# Patient Record
Sex: Male | Born: 1950
Health system: Southern US, Community
[De-identification: ages and names within clinical notes are randomized; demographics above are authoritative.]

## PROBLEM LIST (undated history)

## (undated) DIAGNOSIS — Z8669 Personal history of other diseases of the nervous system and sense organs: Secondary | ICD-10-CM

## (undated) DIAGNOSIS — I10 Essential (primary) hypertension: Secondary | ICD-10-CM

## (undated) DIAGNOSIS — F329 Major depressive disorder, single episode, unspecified: Secondary | ICD-10-CM

## (undated) DIAGNOSIS — K219 Gastro-esophageal reflux disease without esophagitis: Secondary | ICD-10-CM

## (undated) DIAGNOSIS — K439 Ventral hernia without obstruction or gangrene: Secondary | ICD-10-CM

## (undated) DIAGNOSIS — H269 Unspecified cataract: Secondary | ICD-10-CM

## (undated) DIAGNOSIS — J301 Allergic rhinitis due to pollen: Secondary | ICD-10-CM

## (undated) DIAGNOSIS — E785 Hyperlipidemia, unspecified: Secondary | ICD-10-CM

## (undated) DIAGNOSIS — F32A Depression, unspecified: Secondary | ICD-10-CM

## (undated) DIAGNOSIS — R011 Cardiac murmur, unspecified: Secondary | ICD-10-CM

## (undated) HISTORY — DX: Depression, unspecified: F32.A

## (undated) HISTORY — DX: Hyperlipidemia, unspecified: E78.5

## (undated) HISTORY — DX: Allergic rhinitis due to pollen: J30.1

## (undated) HISTORY — DX: Unspecified cataract: H26.9

## (undated) HISTORY — DX: Personal history of other diseases of the nervous system and sense organs: Z86.69

## (undated) HISTORY — DX: Ventral hernia without obstruction or gangrene: K43.9

## (undated) HISTORY — PX: COLONOSCOPY: SHX174

## (undated) HISTORY — DX: Major depressive disorder, single episode, unspecified: F32.9

---
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
## 1985-07-31 HISTORY — PX: HEEL SPUR SURGERY: SHX665

## 2003-04-01 LAB — HM COLONOSCOPY: HM Colonoscopy: NORMAL

## 2007-01-14 ENCOUNTER — Ambulatory Visit: Payer: Self-pay | Admitting: Family Medicine

## 2007-01-21 ENCOUNTER — Ambulatory Visit: Payer: Self-pay | Admitting: Family Medicine

## 2008-04-21 ENCOUNTER — Ambulatory Visit: Payer: Self-pay | Admitting: Family Medicine

## 2008-04-21 DIAGNOSIS — F325 Major depressive disorder, single episode, in full remission: Secondary | ICD-10-CM | POA: Insufficient documentation

## 2008-04-21 DIAGNOSIS — J309 Allergic rhinitis, unspecified: Secondary | ICD-10-CM | POA: Insufficient documentation

## 2008-05-28 ENCOUNTER — Ambulatory Visit: Payer: Self-pay | Admitting: Family Medicine

## 2008-05-28 DIAGNOSIS — R5383 Other fatigue: Secondary | ICD-10-CM

## 2008-05-28 DIAGNOSIS — R5381 Other malaise: Secondary | ICD-10-CM | POA: Insufficient documentation

## 2008-05-28 DIAGNOSIS — E785 Hyperlipidemia, unspecified: Secondary | ICD-10-CM | POA: Insufficient documentation

## 2008-05-28 DIAGNOSIS — K439 Ventral hernia without obstruction or gangrene: Secondary | ICD-10-CM | POA: Insufficient documentation

## 2008-05-29 LAB — CONVERTED CEMR LAB
ALT: 23 U/L
AST: 20 U/L
Albumin: 3.9 g/dL
Alkaline Phosphatase: 88 U/L
BUN: 11 mg/dL
Basophils Absolute: 0 K/uL
Basophils Relative: 0.9 %
Bilirubin, Direct: 0.1 mg/dL
CO2: 30 meq/L
Calcium: 9 mg/dL
Chloride: 105 meq/L
Cholesterol, target level: 200 mg/dL
Cholesterol: 217 mg/dL
Creatinine, Ser: 0.9 mg/dL
Direct LDL: 148.5 mg/dL
Eosinophils Absolute: 0.1 K/uL
Eosinophils Relative: 1.1 %
GFR calc Af Amer: 112 mL/min
GFR calc non Af Amer: 93 mL/min
Glucose, Bld: 77 mg/dL
HCT: 45.6 %
HDL goal, serum: 40 mg/dL
HDL: 34.5 mg/dL — ABNORMAL LOW
Hemoglobin: 15.5 g/dL
LDL Goal: 130 mg/dL
Lymphocytes Relative: 23.4 %
MCHC: 34 g/dL
MCV: 90.9 fL
Monocytes Absolute: 0.5 K/uL
Monocytes Relative: 9.9 %
Neutro Abs: 3.6 K/uL
Neutrophils Relative %: 64.7 %
PSA: 0.54 ng/mL
Platelets: 249 K/uL
Potassium: 4.1 meq/L
RBC: 5.01 M/uL
RDW: 12.5 %
Sodium: 141 meq/L
Total Bilirubin: 0.9 mg/dL
Total CHOL/HDL Ratio: 6.3
Total Protein: 6.5 g/dL
Triglycerides: 85 mg/dL
VLDL: 17 mg/dL
WBC: 5.5 10*3/microliter

## 2009-01-21 ENCOUNTER — Ambulatory Visit: Payer: Self-pay | Admitting: Family Medicine

## 2009-01-25 ENCOUNTER — Encounter (INDEPENDENT_AMBULATORY_CARE_PROVIDER_SITE_OTHER): Payer: Self-pay | Admitting: *Deleted

## 2009-01-25 LAB — CONVERTED CEMR LAB
ALT: 23 units/L (ref 0–53)
AST: 24 units/L (ref 0–37)
Albumin: 4 g/dL (ref 3.5–5.2)
Alkaline Phosphatase: 100 units/L (ref 39–117)
Bilirubin, Direct: 0.1 mg/dL (ref 0.0–0.3)
Cholesterol: 214 mg/dL — ABNORMAL HIGH (ref 0–200)
Direct LDL: 160.3 mg/dL
HDL: 38.1 mg/dL — ABNORMAL LOW (ref >39.00)
Total Bilirubin: 0.9 mg/dL (ref 0.3–1.2)
Total CHOL/HDL Ratio: 6
Total Protein: 7 g/dL (ref 6.0–8.3)
Triglycerides: 90 mg/dL (ref 0.0–149.0)
VLDL: 18 mg/dL (ref 0.0–40.0)

## 2009-05-27 ENCOUNTER — Ambulatory Visit: Payer: Self-pay | Admitting: Family Medicine

## 2009-06-06 LAB — CONVERTED CEMR LAB
ALT: 20 units/L (ref 0–53)
AST: 20 units/L (ref 0–37)
Albumin: 3.9 g/dL (ref 3.5–5.2)
Alkaline Phosphatase: 82 units/L (ref 39–117)
BUN: 10 mg/dL (ref 6–23)
Basophils Absolute: 0 10*3/uL (ref 0.0–0.1)
Basophils Relative: 0.3 % (ref 0.0–3.0)
Bilirubin, Direct: 0 mg/dL (ref 0.0–0.3)
CO2: 27 meq/L (ref 19–32)
Calcium: 8.7 mg/dL (ref 8.4–10.5)
Chloride: 102 meq/L (ref 96–112)
Cholesterol: 216 mg/dL — ABNORMAL HIGH (ref 0–200)
Creatinine, Ser: 0.9 mg/dL (ref 0.4–1.5)
Direct LDL: 164.4 mg/dL
Eosinophils Absolute: 0.1 10*3/uL (ref 0.0–0.7)
Eosinophils Relative: 1.3 % (ref 0.0–5.0)
GFR calc non Af Amer: 92.24 mL/min (ref >60)
Glucose, Bld: 87 mg/dL (ref 70–99)
HCT: 46.3 % (ref 39.0–52.0)
HDL: 41.6 mg/dL (ref >39.00)
Hemoglobin: 15.7 g/dL (ref 13.0–17.0)
Lymphocytes Relative: 22.8 % (ref 12.0–46.0)
Lymphs Abs: 1.1 10*3/uL (ref 0.7–4.0)
MCHC: 34 g/dL (ref 30.0–36.0)
MCV: 93.2 fL (ref 78.0–100.0)
Monocytes Absolute: 0.5 10*3/uL (ref 0.1–1.0)
Monocytes Relative: 10.8 % (ref 3.0–12.0)
Neutro Abs: 3.2 10*3/uL (ref 1.4–7.7)
Neutrophils Relative %: 64.8 % (ref 43.0–77.0)
PSA: 0.69 ng/mL (ref 0.10–4.00)
Platelets: 255 10*3/uL (ref 150.0–400.0)
Potassium: 4.1 meq/L (ref 3.5–5.1)
RBC: 4.97 M/uL (ref 4.22–5.81)
RDW: 12.8 % (ref 11.5–14.6)
Sodium: 136 meq/L (ref 135–145)
Total Bilirubin: 0.8 mg/dL (ref 0.3–1.2)
Total CHOL/HDL Ratio: 5
Total Protein: 6.7 g/dL (ref 6.0–8.3)
Triglycerides: 58 mg/dL (ref 0.0–149.0)
VLDL: 11.6 mg/dL (ref 0.0–40.0)
WBC: 4.9 10*3/uL (ref 4.5–10.5)

## 2009-06-08 ENCOUNTER — Ambulatory Visit: Payer: Self-pay | Admitting: Family Medicine

## 2009-06-09 LAB — CONVERTED CEMR LAB
Chlamydia, Swab/Urine, PCR: NEGATIVE
GC Probe Amp, Urine: NEGATIVE

## 2009-08-12 ENCOUNTER — Ambulatory Visit: Payer: Self-pay | Admitting: Family Medicine

## 2009-11-04 ENCOUNTER — Ambulatory Visit: Payer: Self-pay | Admitting: Family Medicine

## 2010-04-20 ENCOUNTER — Ambulatory Visit: Payer: Self-pay | Admitting: Family Medicine

## 2010-04-20 LAB — CONVERTED CEMR LAB
ALT: 25 units/L (ref 0–53)
AST: 26 units/L (ref 0–37)
Albumin: 3.8 g/dL (ref 3.5–5.2)
Alkaline Phosphatase: 89 units/L (ref 39–117)
BUN: 16 mg/dL (ref 6–23)
Basophils Absolute: 0 10*3/uL (ref 0.0–0.1)
Basophils Relative: 0.6 % (ref 0.0–3.0)
Bilirubin, Direct: 0.1 mg/dL (ref 0.0–0.3)
CO2: 29 meq/L (ref 19–32)
Calcium: 9 mg/dL (ref 8.4–10.5)
Chloride: 104 meq/L (ref 96–112)
Cholesterol: 217 mg/dL — ABNORMAL HIGH (ref 0–200)
Creatinine, Ser: 0.9 mg/dL (ref 0.4–1.5)
Direct LDL: 172.7 mg/dL
Eosinophils Absolute: 0 10*3/uL (ref 0.0–0.7)
Eosinophils Relative: 0.8 % (ref 0.0–5.0)
GFR calc non Af Amer: 94.36 mL/min (ref >60)
Glucose, Bld: 83 mg/dL (ref 70–99)
HCT: 46.3 % (ref 39.0–52.0)
HDL: 39.6 mg/dL (ref >39.00)
Hemoglobin: 15.5 g/dL (ref 13.0–17.0)
Lymphocytes Relative: 20.5 % (ref 12.0–46.0)
Lymphs Abs: 1.2 10*3/uL (ref 0.7–4.0)
MCHC: 33.6 g/dL (ref 30.0–36.0)
MCV: 93 fL (ref 78.0–100.0)
Monocytes Absolute: 0.7 10*3/uL (ref 0.1–1.0)
Monocytes Relative: 11.5 % (ref 3.0–12.0)
Neutro Abs: 3.8 10*3/uL (ref 1.4–7.7)
Neutrophils Relative %: 66.6 % (ref 43.0–77.0)
Platelets: 246 10*3/uL (ref 150.0–400.0)
Potassium: 4.9 meq/L (ref 3.5–5.1)
RBC: 4.97 M/uL (ref 4.22–5.81)
RDW: 14.1 % (ref 11.5–14.6)
Sodium: 141 meq/L (ref 135–145)
Total Bilirubin: 0.5 mg/dL (ref 0.3–1.2)
Total CHOL/HDL Ratio: 5
Total Protein: 6.5 g/dL (ref 6.0–8.3)
Triglycerides: 47 mg/dL (ref 0.0–149.0)
VLDL: 9.4 mg/dL (ref 0.0–40.0)
WBC: 5.7 10*3/uL (ref 4.5–10.5)

## 2010-04-21 ENCOUNTER — Ambulatory Visit: Payer: Self-pay | Admitting: Family Medicine

## 2010-08-04 ENCOUNTER — Ambulatory Visit
Admission: RE | Admit: 2010-08-04 | Discharge: 2010-08-04 | Payer: Self-pay | Source: Home / Self Care | Attending: Family Medicine | Admitting: Family Medicine

## 2010-08-04 ENCOUNTER — Other Ambulatory Visit: Payer: Self-pay | Admitting: Family Medicine

## 2010-08-04 DIAGNOSIS — I10 Essential (primary) hypertension: Secondary | ICD-10-CM | POA: Insufficient documentation

## 2010-08-04 DIAGNOSIS — H6120 Impacted cerumen, unspecified ear: Secondary | ICD-10-CM | POA: Insufficient documentation

## 2010-08-04 LAB — LIPID PANEL
Cholesterol: 139 mg/dL (ref 0–200)
HDL: 37.6 mg/dL — ABNORMAL LOW (ref >39.00)
LDL Cholesterol: 87 mg/dL (ref 0–99)
Total CHOL/HDL Ratio: 4
Triglycerides: 72 mg/dL (ref 0.0–149.0)
VLDL: 14.4 mg/dL (ref 0.0–40.0)

## 2010-08-04 LAB — HEPATIC FUNCTION PANEL
ALT: 29 U/L (ref 0–53)
AST: 28 U/L (ref 0–37)
Albumin: 3.9 g/dL (ref 3.5–5.2)
Alkaline Phosphatase: 90 U/L (ref 39–117)
Bilirubin, Direct: 0.1 mg/dL (ref 0.0–0.3)
Total Bilirubin: 0.7 mg/dL (ref 0.3–1.2)
Total Protein: 6.5 g/dL (ref 6.0–8.3)

## 2010-08-30 NOTE — Assessment & Plan Note (Signed)
Summary: return office visit/ discuss meds/ alc   Vital Signs:  Patient profile:   60 year old male Height:      73 inches Weight:      235.0 pounds BMI:     31.12 Temp:     98.0 degrees F oral Pulse rate:   72 / minute Pulse rhythm:   regular BP sitting:   124 / 80  (left arm) Cuff size:   regular  Vitals Entered By: Benny Lennert CMA Duncan Dull) (November 04, 2009 7:56 AM)  History of Present Illness: Chief complaint Discuss medication  60 year old:  Doing well on the effexor  anxiety gotten better  has been taking it steadily has taken it and gets a spell of nausea  going to to counsellor    Allergies (verified): No Known Drug Allergies  Past History:  Past medical, surgical, family and social histories (including risk factors) reviewed, and no changes noted (except as noted below).  Past Medical History: Reviewed history from 04/26/2008 and no changes required. Allergic rhinitis Depression, had taken Effoexer-had some difficulty last time. Went to Psych years ago. Bell's Palsy  Past Surgical History: Reviewed history from 04/21/2008 and no changes required. Tonsillectomy, 1960 Heel [place], 1987  Family History: Reviewed history from 04/26/2008 and no changes required. Family History of Alcoholism/Addiction FH CAD, Grandfather Father, MI, ETOH Sister, Hypothyroid  Depression Breast CA Aunt-Ovarian CA  Social History: Reviewed history from 04/21/2008 and no changes required. Occupation: Airline pilot Married: divorced Former tob: Alcohol use-yes Drug use-no   Impression & Recommendations:  Problem # 1:  DEPRESSION (ICD-311) >15 minutes spent in face to face time with patient, >50% spent in counselling or coordination of care: doing better, on effexor, in counselling. some n, switching to er version  His updated medication list for this problem includes:    Venlafaxine Hcl 37.5 Mg Xr24h-cap (Venlafaxine hcl) .Marland Kitchen... 1 by mouth daily (failure of ir effexor  and multiple ssri's)  Complete Medication List: 1)  Venlafaxine Hcl 37.5 Mg Xr24h-cap (Venlafaxine hcl) .Marland Kitchen.. 1 by mouth daily (failure of ir effexor and multiple ssri's)  Patient Instructions: 1)  f/u 3 months Prescriptions: VENLAFAXINE HCL 37.5 MG XR24H-CAP (VENLAFAXINE HCL) 1 by mouth daily (failure of IR effexor and multiple SSRI's)  #30 x 11   Entered and Authorized by:   Hannah Beat MD   Signed by:   Hannah Beat MD on 11/04/2009   Method used:   Electronically to        [facility] [address] (retail)       [address]       [place], [place]  [address]       Ph: [phone]       Fax: [phone]   RxID:   [id number]   Current Allergies (reviewed today): No known allergies

## 2010-08-30 NOTE — Assessment & Plan Note (Signed)
Summary: F/U/CLE   Vital Signs:  Patient profile:   60 year old male Height:      73 inches Weight:      234.0 pounds BMI:     30.98 Temp:     98.5 degrees F oral Pulse rate:   72 / minute Pulse rhythm:   regular BP sitting:   140 / 90  (left arm) Cuff size:   regular  Vitals Entered By: Benny Lennert CMA Duncan Dull) (April 21, 2010 10:05 AM)  History of Present Illness: Chief complaint follow up  f/u Dep: generally doing okay, he does have some periods of being up and down, but he is overall much better than he was before.  Lipids: lipid panel reviewed, continues to have total cholesterol greater than 210, and an LDL that is greater than 170.  No change at all in about a year. He has not been that compliant with his  diet and exercise, but he is open to starting cholesterol medications  160/85, elevated blood pressures today, historically his blood pressures have been fine.Marland Kitchen He did have a martini last night.  REVIEW OF SYSTEMS GEN: No acute illnesses, no fever, chills, sweats. CV: No chest pain or SOB GI: No noted N or V Otherwise, pertinent positives and negatives are noted in the HPI.   GEN: WDWN, NAD, Non-toxic, A & O x 3 HEENT: Atraumatic, Normocephalic. Neck supple. No masses, No LAD. Ears and Nose: No external deformity. CV: RRR, No M/G/R. No JVD. No thrill. No extra heart sounds. PULM: CTA B, no wheezes, crackles, rhonchi. No retractions. No resp. distress. No accessory muscle use. EXTR: No c/c/e NEURO: Normal gait.  PSYCH: Normally interactive. Conversant. Not depressed or anxious appearing.  Calm demeanor.    Allergies (verified): No Known Drug Allergies  Past History:  Past Medical History: Allergic rhinitis Depression, had taken Effoexer-had some difficulty last time. Went to Psych years ago. Bell's Palsy Hyperlipidemia   Impression & Recommendations:  Problem # 1:  HYPERLIPIDEMIA (ICD-272.4) Assessment Deteriorated  His updated medication list  for this problem includes:    Simvastatin 40 Mg Tabs (Simvastatin) .Marland Kitchen... Take one tablet at bedtime  Labs Reviewed: SGOT: 26 (04/20/2010)   SGPT: 25 (04/20/2010)  Lipid Goals: Chol Goal: 200 (05/28/2008)   HDL Goal: 40 (05/28/2008)   LDL Goal: 130 (05/28/2008)   TG Goal: 150 (05/28/2008)  Prior 10 Yr Risk Heart Disease: Not enough information (05/28/2008)   HDL:39.60 (04/20/2010), 41.60 (05/27/2009)  LDL:DEL (05/28/2008)  Chol:217 (04/20/2010), 216 (05/27/2009)  Trig:47.0 (04/20/2010), 58.0 (05/27/2009)  Problem # 2:  ELEVATED BLOOD PRESSURE WITHOUT DIAGNOSIS OF HYPERTENSION (ICD-796.2) get BP cuff, follow at home, call if > 140/90 routinely. Today, may be a post ETOH response  Problem # 3:  DEPRESSION (ICD-311) stable  His updated medication list for this problem includes:    Venlafaxine Hcl 37.5 Mg Xr24h-cap (Venlafaxine hcl) .Marland Kitchen... 1 by mouth daily (failure of ir effexor and multiple ssri's)  Complete Medication List: 1)  Venlafaxine Hcl 37.5 Mg Xr24h-cap (Venlafaxine hcl) .Marland Kitchen.. 1 by mouth daily (failure of ir effexor and multiple ssri's) 2)  Simvastatin 40 Mg Tabs (Simvastatin) .... Take one tablet at bedtime  Patient Instructions: 1)  Get a blood pressure cuff, then check every other day.  2)  If above 140/90 routinely, then you need to follow-up with me. 3)  follow-up for full physical at the end of the year Prescriptions: SIMVASTATIN 40 MG TABS (SIMVASTATIN) Take one tablet at bedtime  #30 x 11  Entered and Authorized by:   Hannah Beat MD   Signed by:   Hannah Beat MD on 04/21/2010   Method used:   Print then Give to Patient   RxID:   [id number]   Current Allergies (reviewed today): No known allergies

## 2010-08-30 NOTE — Assessment & Plan Note (Signed)
Summary: F/U   Vital Signs:  Patient profile:   60 year old male Height:      73 inches Weight:      229.8 pounds BMI:     30.43 Temp:     98.0 degrees F oral Pulse rate:   72 / minute Pulse rhythm:   regular BP sitting:   120 / 90  (left arm) Cuff size:   regular  Vitals Entered By: Benny Lennert CMA Duncan Dull) (August 12, 2009 10:31 AM)  History of Present Illness: Spent christmas with his sister in [place]  Mood  much better  Called [facility], office that is close to his office. Has been for a few seessions.  This past weekend, hit a spell and  Only for moment this last weekend  There are still some things just below the surface.  Still wakes up some early in the morning. Waking up some early.   SSRI's did not work   no SI or HI  Allergies (verified): No Known Drug Allergies   Impression & Recommendations:  Problem # 1:  DEPRESSION (ICD-311) >15 minutes spent in total face to face time with the patient with >50% of time spent in counselling and coordination of care:  we talked about his depression. Generally he is doing better after going to counseling. He has good family interaction. He still is depressed, though he is sleeping somewhat better. He is waking up still early in the morning. General he does feel depressed. In the past, SSRIs have not worked. He has taken Effexor in the past with some good success, and additionally is taking some Elavil in the past with success. Effexor, even at a dose of 37.5 mg in xr version gave him some side effects. He is interested in trying a very low dose of this medication which I do think is reasonable. He will continue with his counseling.  His updated medication list for this problem includes:    Venlafaxine Hcl 25 Mg Tabs (Venlafaxine hcl) .Marland Kitchen... 1/2  by mouth two times a day  Complete Medication List: 1)  Venlafaxine Hcl 25 Mg Tabs (Venlafaxine hcl) .... 1/2  by mouth two times a day  Patient Instructions: 1)  f/u 3  months Prescriptions: VENLAFAXINE HCL 25 MG TABS (VENLAFAXINE HCL) 1/2  by mouth two times a day  #30 x 5   Entered and Authorized by:   Hannah Beat MD   Signed by:   Hannah Beat MD on 08/12/2009   Method used:   Electronically to        [facility] [address] (retail)       [address]       [place], [place]  [address]       Ph: [phone]       Fax: [phone]   RxID:   [id number]   Prior Medications (reviewed today): None Current Allergies (reviewed today): No known allergies

## 2010-09-01 NOTE — Assessment & Plan Note (Signed)
Summary: EAR WAX/HIGH BP/RBH   Vital Signs:  Patient profile:   60 year old male Height:      73 inches Weight:      242.25 pounds Temp:     97.9 degrees F oral Pulse rate:   72 / minute Pulse rhythm:   regular BP sitting:   140 / 90  (left arm) Cuff size:   regular  Vitals Entered By: Benny Lennert CMA (AAMA) (August 04, 2010 10:00 AM)  Serial Vital Signs/Assessments:  Time      Position  BP       Pulse  Resp  Temp     By 10:18 AM            155/90                         Hannah Beat MD   History of Present Illness: Chief complaint Ear wax and high bp  60 year old male:  Cerumen impaction: B cerumen impaction, ears flushed  HTN:   Cardiovascular Risk History:      Positive major cardiovascular risk factors include male age 21 years old or older, hyperlipidemia, and hypertension.  Negative major cardiovascular risk factors include no history of diabetes, negative family history for ischemic heart disease, and non-tobacco-user status.        Further assessment for target organ damage reveals no history of ASHD, cardiac end-organ damage (CHF/LVH), stroke/TIA, peripheral vascular disease, renal insufficiency, or hypertensive retinopathy.    Hypertension History:      He denies headache, chest pain, palpitations, dyspnea with exertion, orthopnea, PND, peripheral edema, visual symptoms, neurologic problems, syncope, and side effects from treatment.        Positive major cardiovascular risk factors include male age 45 years old or older, hyperlipidemia, and hypertension.  Negative major cardiovascular risk factors include no history of diabetes, negative family history for ischemic heart disease, and non-tobacco-user status.        Further assessment for target organ damage reveals no history of ASHD, cardiac end-organ damage (CHF/LVH), stroke/TIA, peripheral vascular disease, renal insufficiency, or hypertensive retinopathy.    Allergies (verified): No Known Drug  Allergies  Past History:  Family History: Last updated: 04/26/2008 Family History of Alcoholism/Addiction FH CAD, Grandfather Father, MI, ETOH Sister, Hypothyroid  Depression Breast CA Aunt-Ovarian CA  Social History: Last updated: 04/21/2008 Occupation: Airline pilot Married: divorced Former tob: Alcohol use-yes Drug use-no  Risk Factors: Alcohol Use: <1 (06/08/2009) Diet: not doing very well right now (06/08/2009) Exercise: no (06/08/2009)  Risk Factors: Smoking Status: never (06/08/2009)  Past medical, surgical, family and social histories (including risk factors) reviewed, and no changes noted (except as noted below).  Past Medical History: Reviewed history from 04/21/2010 and no changes required. Allergic rhinitis Depression, had taken Effoexer-had some difficulty last time. Went to Psych years ago. Bell's Palsy Hyperlipidemia  Past Surgical History: Reviewed history from 04/21/2008 and no changes required. Tonsillectomy, 1960 Heel [place], 1987  Family History: Reviewed history from 04/26/2008 and no changes required. Family History of Alcoholism/Addiction FH CAD, Grandfather Father, MI, ETOH Sister, Hypothyroid  Depression Breast CA Aunt-Ovarian CA  Social History: Reviewed history from 04/21/2008 and no changes required. Occupation: Airline pilot Married: divorced Former tob: Alcohol use-yes Drug use-no  Review of Systems       REVIEW OF SYSTEMS GEN: No acute illnesses, no fever, chills, sweats. CV: No chest pain or SOB GI: No noted N or V Otherwise, pertinent positives and  negatives are noted in the HPI.   Physical Exam  Additional Exam:  GEN: WDWN, NAD, Non-toxic, A & O x 3 HEENT: Atraumatic, Normocephalic. Neck supple. No masses, No LAD. Ears and Nose: No external deformity. CV: RRR, No M/G/R. No JVD. No thrill. No extra heart sounds. PULM: CTA B, no wheezes, crackles, rhonchi. No retractions. No resp. distress. No accessory muscle use. ABD: S,  NT, ND, +BS. No rebound tenderness. No HSM.  EXTR: No c/c/e NEURO: Normal gait.  PSYCH: Normally interactive. Conversant. Not depressed or anxious appearing.  Calm demeanor.     Impression & Recommendations:  Problem # 1:  HYPERTENSION, ESSENTIAL NOS (ICD-401.9) Assessment New start meds new dx, close f/u  His updated medication list for this problem includes:    Hydrochlorothiazide 12.5 Mg Tabs (Hydrochlorothiazide) .Marland Kitchen... Take 1 tab  by mouth every morning  BP today: 140/90 Prior BP: 140/90 (04/21/2010)  Prior 10 Yr Risk Heart Disease: Not enough information (05/28/2008)  Labs Reviewed: K+: 4.9 (04/20/2010) Creat: : 0.9 (04/20/2010)   Chol: 217 (04/20/2010)   HDL: 39.60 (04/20/2010)   LDL: DEL (05/28/2008)   TG: 47.0 (04/20/2010)  Problem # 2:  CERUMEN IMPACTION, BILATERAL (ICD-380.4) ears cleaned, flushed  Problem # 3:  HYPERLIPIDEMIA (ICD-272.4)  His updated medication list for this problem includes:    Simvastatin 40 Mg Tabs (Simvastatin) .Marland Kitchen... Take one tablet at bedtime  Orders: Venipuncture ([address]109) TLB-Hepatic/Liver Function Pnl (80076-HEPATIC) TLB-Lipid Panel (80061-LIPID)  Complete Medication List: 1)  Venlafaxine Hcl 37.5 Mg Xr24h-cap (Venlafaxine hcl) .Marland Kitchen.. 1 by mouth daily (failure of ir effexor and multiple ssri's) 2)  Simvastatin 40 Mg Tabs (Simvastatin) .... Take one tablet at bedtime 3)  Hydrochlorothiazide 12.5 Mg Tabs (Hydrochlorothiazide) .... Take 1 tab  by mouth every morning  Cardiovascular Risk Assessment/Plan:      The patient's hypertensive risk group is category B: At least one risk factor (excluding diabetes) with no target organ damage.  Today's blood pressure is 140/90.  His blood pressure goal is < 140/90.  Hypertension Assessment/Plan:      The patient's hypertensive risk group is category B: At least one risk factor (excluding diabetes) with no target organ damage.  Today's blood pressure is 140/90.  His blood pressure goal is <  140/90.  Patient Instructions: 1)  f/u 1 month Prescriptions: HYDROCHLOROTHIAZIDE 12.5 MG  TABS (HYDROCHLOROTHIAZIDE) Take 1 tab  by mouth every morning  #90 x 3   Entered and Authorized by:   Hannah Beat MD   Signed by:   Hannah Beat MD on 08/04/2010   Method used:   Electronically to        [facility] [address] (retail)       [address]       [place], [place]  [address]       Ph: [phone]       Fax: [phone]   RxID:   [id number]    Orders Added: 1)  Venipuncture [44010] 2)  TLB-Hepatic/Liver Function Pnl [80076-HEPATIC] 3)  TLB-Lipid Panel [80061-LIPID] 4)  Est. Patient Level IV [27253]    Current Allergies (reviewed today): No known allergies

## 2010-11-23 ENCOUNTER — Other Ambulatory Visit: Payer: Self-pay | Admitting: Family Medicine

## 2010-11-23 NOTE — Telephone Encounter (Signed)
Ok to refill with 11 refills. 

## 2011-04-07 ENCOUNTER — Encounter: Payer: Self-pay | Admitting: Family Medicine

## 2011-04-10 ENCOUNTER — Ambulatory Visit (INDEPENDENT_AMBULATORY_CARE_PROVIDER_SITE_OTHER): Payer: 59 | Admitting: Family Medicine

## 2011-04-10 ENCOUNTER — Encounter: Payer: Self-pay | Admitting: Family Medicine

## 2011-04-10 VITALS — BP 130/98 | HR 89 | Temp 98.1°F | Ht 73.0 in | Wt 242.8 lb

## 2011-04-10 DIAGNOSIS — Z23 Encounter for immunization: Secondary | ICD-10-CM

## 2011-04-10 DIAGNOSIS — I10 Essential (primary) hypertension: Secondary | ICD-10-CM

## 2011-04-10 MED ORDER — LISINOPRIL-HYDROCHLOROTHIAZIDE 10-12.5 MG PO TABS: 1.0000 | ORAL_TABLET | Freq: Every day | ORAL | Status: DC

## 2011-04-10 NOTE — Progress Notes (Signed)
  Subjective:    Patient ID: Timothy Wang, male    DOB: March 01, 1951, 60 y.o.   MRN: [id number]  HPI  Timothy Wang, a 60 y.o. male presents today in the office for the following:    130's 90's at home  140/101 at [facility].   160/105  HTN: Tolerating all medications without side effects Stable and at goal No CP, no sob. No HA.  BP Readings from Last 3 Encounters:  04/10/11 130/98  08/04/10 140/90  04/21/10 140/90    Basic Metabolic Panel:    Component Value Date/Time   NA 141 04/20/2010 1006   K 4.9 04/20/2010 1006   CL 104 04/20/2010 1006   CO2 29 04/20/2010 1006   BUN 16 04/20/2010 1006   CREATININE 0.9 04/20/2010 1006   GLUCOSE 83 04/20/2010 1006   CALCIUM 9.0 04/20/2010 1006       The PMH, PSH, Social History, Family History, Medications, and allergies have been reviewed in [facility], and have been updated if relevant.  Review of Systems ROS: GEN: No acute illnesses, no fevers, chills. GI: No n/v/d, eating normally Pulm: No SOB Interactive and getting along well at home.  Otherwise, ROS is as per the HPI.     Objective:   Physical Exam   Physical Exam  Blood pressure 130/98, pulse 89, temperature 98.1 F (36.7 C), temperature source Oral, height 6\' 1"  (1.854 m), weight 242 lb 12.8 oz (110.133 kg), SpO2 98.00%.  GEN: WDWN, NAD, Non-toxic, A & O x 3 HEENT: Atraumatic, Normocephalic. Neck supple. No masses, No LAD. Ears and Nose: No external deformity. CV: RRR, No M/G/R. No JVD. No thrill. No extra heart sounds. PULM: CTA B, no wheezes, crackles, rhonchi. No retractions. No resp. distress. No accessory muscle use. EXTR: No c/c/e NEURO Normal gait.  PSYCH: Normally interactive. Conversant. Not depressed or anxious appearing.  Calm demeanor.        Assessment & Plan:   1. Flu vaccine need  Flu vaccine greater than or equal to 3yo preservative free IM  2. HYPERTENSION, ESSENTIAL NOS  lisinopril-hydrochlorothiazide (PRINZIDE,ZESTORETIC) 10-12.5 MG per tablet      Add ACE

## 2011-04-29 ENCOUNTER — Other Ambulatory Visit: Payer: Self-pay | Admitting: Family Medicine

## 2011-12-24 ENCOUNTER — Other Ambulatory Visit: Payer: Self-pay | Admitting: Family Medicine

## 2011-12-26 ENCOUNTER — Other Ambulatory Visit: Payer: Self-pay | Admitting: *Deleted

## 2011-12-26 MED ORDER — VENLAFAXINE HCL ER 37.5 MG PO CP24: 37.5000 mg | ORAL_CAPSULE | Freq: Every day | ORAL | Status: DC

## 2012-02-05 ENCOUNTER — Ambulatory Visit (INDEPENDENT_AMBULATORY_CARE_PROVIDER_SITE_OTHER)
Admission: RE | Admit: 2012-02-05 | Discharge: 2012-02-05 | Disposition: A | Discharge status: Home / Self Care | Payer: BC Managed Care – PPO | Source: Ambulatory Visit | Attending: Family Medicine | Admitting: Family Medicine

## 2012-02-05 ENCOUNTER — Ambulatory Visit (INDEPENDENT_AMBULATORY_CARE_PROVIDER_SITE_OTHER): Payer: BC Managed Care – PPO | Admitting: Family Medicine

## 2012-02-05 ENCOUNTER — Encounter: Payer: Self-pay | Admitting: Family Medicine

## 2012-02-05 ENCOUNTER — Other Ambulatory Visit: Payer: Self-pay | Admitting: Family Medicine

## 2012-02-05 VITALS — BP 130/72 | HR 76 | Temp 98.3°F | Ht 73.0 in | Wt 244.8 lb

## 2012-02-05 DIAGNOSIS — M25569 Pain in unspecified knee: Secondary | ICD-10-CM

## 2012-02-05 DIAGNOSIS — M25561 Pain in right knee: Secondary | ICD-10-CM

## 2012-02-05 DIAGNOSIS — M25469 Effusion, unspecified knee: Secondary | ICD-10-CM

## 2012-02-05 MED ORDER — FLUTICASONE PROPIONATE 50 MCG/ACT NA SUSP: 2.0000 | Freq: Every day | NASAL | Status: DC

## 2012-02-05 NOTE — Progress Notes (Signed)
Nature conservation officer at [facility] [address] [place] [address] Phone: [phone] Fax: [phone]   Patient Name: Timothy Wang Date of Birth: October 05, 1950 Age: 61 y.o. Medical Record Number: [id number] Gender: male Date of Encounter: 02/05/2012  Chief Complaint: Joint Swelling   History of Present Illness:  Timothy Wang is a 61 y.o. very pleasant male patient who presents with the following:  Happened a couple of months ago, and when at its worst, he cannot walk on it. Has been 4-5 days.   Very pleasant patient who I know well who had some knee pain several years ago and had a mild to moderate effusion and we aspirated and injected, and then he has done well without any significant pain, but to 3 months ago he started to have some repetitive significant amounts of swelling and pain. He has been using a compressive sleeve and taken some anti-inflammatories. He currently has a very large effusion. He is not having any symptomatic way or falling. No locking.  Past Medical History, Surgical History, Social History, Family History, Problem List, Medications, and Allergies have been reviewed and updated if relevant.  Current Outpatient Prescriptions on File Prior to Visit  Medication Sig Dispense Refill  . lisinopril-hydrochlorothiazide (PRINZIDE,ZESTORETIC) 10-12.5 MG per tablet Take 1 tablet by mouth daily.  90 tablet  3  . simvastatin (ZOCOR) 40 MG tablet TAKE ONE TABLET BY MOUTH AT BEDTIME  30 tablet  11  . venlafaxine XR (EFFEXOR-XR) 37.5 MG 24 hr capsule Take 1 capsule (37.5 mg total) by mouth daily.  30 capsule  4    Review of Systems:  GEN: No fevers, chills. Nontoxic. Primarily MSK c/o today. MSK: Detailed in the HPI GI: tolerating PO intake without difficulty Neuro: No numbness, parasthesias, or tingling associated. Otherwise the pertinent positives of the ROS are noted above.   He is also been getting some postnasal drip and cough  Physical  Examination: Filed Vitals:   02/05/12 1539  BP: 130/72  Pulse: 76  Temp: 98.3 F (36.8 C)   Filed Vitals:   02/05/12 1539  Height: 6\' 1"  (1.854 m)  Weight: 244 lb 12.8 oz (111.041 kg)   Body mass index is 32.30 kg/(m^2). Ideal Body Weight: Weight in (lb) to have BMI = 25: 189.1    GEN: WDWN, NAD, Non-toxic, Alert & Oriented x 3 HEENT: Atraumatic, Normocephalic.  Ears and Nose: No external deformity. EXTR: No clubbing/cyanosis/edema NEURO: Normal gait, slight antalgia  PSYCH: Normally interactive. Conversant. Not depressed or anxious appearing.  Calm demeanor.   RIGHT knee with a very large ballotable effusion. Mild tenderness on the medial and lateral joint line. Lachman is negative. ACL and PCL are stable on anterior and posterior drawer testing. MCL and LCL are stable. All meniscal testing is equivocal. Bounce home test is negative.  EKG / Xrays / Labs: Dg Knee 1-2 Views Right  02/05/2012  *RADIOLOGY REPORT*  Clinical Data: Right knee pain.  BILATERAL KNEES STANDING - 1 VIEW,RIGHT KNEE - 1-2 VIEW  Comparison: None.  Findings:  Right knee three views:  Moderate patellofemoral joint degenerative changes with osteophyte formation.  Mild medial and lateral tibiofemoral joint space degenerative changes.  Moderate to large size right suprapatellar joint effusion may contain a loose body.  Alternatively, this may represent bony overgrowth of the anterior margin of the distal right femur.  Single frontal view left knee:  Mild left lateral tibial femoral joint space narrowing.  Mild lateral subluxation of the left patella may be  present versus rotation of the leg.  IMPRESSION: Right knee three views:  Moderate patellofemoral joint degenerative changes with osteophyte formation.  Mild medial and lateral tibiofemoral joint space degenerative changes.  Moderate to large size right suprapatellar joint effusion may contain a loose body.  Alternatively, this may represent bony overgrowth of the  anterior margin of the distal right femur.  Single frontal view left knee:  Mild left lateral tibial femoral joint space narrowing.  Mild lateral subluxation of the left patella may be present versus rotation of the leg.  Original Report Authenticated By: Fuller Canada, M.D.   Dg Knee Bilateral Standing Ap  02/05/2012  *RADIOLOGY REPORT*  Clinical Data: Right knee pain.  BILATERAL KNEES STANDING - 1 VIEW,RIGHT KNEE - 1-2 VIEW  Comparison: None.  Findings:  Right knee three views:  Moderate patellofemoral joint degenerative changes with osteophyte formation.  Mild medial and lateral tibiofemoral joint space degenerative changes.  Moderate to large size right suprapatellar joint effusion may contain a loose body.  Alternatively, this may represent bony overgrowth of the anterior margin of the distal right femur.  Single frontal view left knee:  Mild left lateral tibial femoral joint space narrowing.  Mild lateral subluxation of the left patella may be present versus rotation of the leg.  IMPRESSION: Right knee three views:  Moderate patellofemoral joint degenerative changes with osteophyte formation.  Mild medial and lateral tibiofemoral joint space degenerative changes.  Moderate to large size right suprapatellar joint effusion may contain a loose body.  Alternatively, this may represent bony overgrowth of the anterior margin of the distal right femur.  Single frontal view left knee:  Mild left lateral tibial femoral joint space narrowing.  Mild lateral subluxation of the left patella may be present versus rotation of the leg.  Original Report Authenticated By: Fuller Canada, M.D.    --most prominent finding is the patellofemoral arthritis. There may be a loose body in the suprapatellar recess.  Assessment and Plan:  1. Right knee pain  DG Knee 1-2 Views Right, DG Knee Bilateral Standing AP, Cell count + diff,  w/ cryst-synvl fld, Glucose, synovial fluid, Gram stain  Allergies, flonase  Aspirate and  inject the patient's knee with some corticosteroids. We will send the patient's synovial fluid off  to rule out the does not have gout or another process such as CPPD, which would have not done before  Knee Aspiration and Injection, RIGHT Patient verbally consented; risks, benefits, and alternatives explained including possible infection. Patient prepped with Chloraprep. Ethyl chloride for anesthesia. 10 cc of 1% Lidocaine used in wheal then injected Subcutaneous fashion with 27 gauge needle on lateral approach. Under sterilne conditions, 18 gauge needle used via lateral approach to aspirate 120 cc of yellowish synovial fluid. Then 9 cc of Lidocaine 1% and 1 cc of Depo-Medrol 40 mg injected. Tolerated well, decreased pain, no complications.   Orders Today: Orders Placed This Encounter  Procedures  . Gram stain  . DG Knee 1-2 Views Right    Standing Status: Future     Number of Occurrences: 1     Standing Expiration Date: 04/06/2013    Order Specific Question:  Preferred imaging location?    Answer:  [facility]    Order Specific Question:  Reason for exam:    Answer:  right knee pain  . DG Knee Bilateral Standing AP    Standing Status: Future     Number of Occurrences: 1     Standing Expiration Date:  04/06/2013    Order Specific Question:  Preferred imaging location?    Answer:  [facility]    Order Specific Question:  Reason for exam:    Answer:  right knee pain  . Cell count + diff,  w/ cryst-synvl fld  . Glucose, synovial fluid    Medications Today: Meds ordered this encounter  Medications  . fluticasone (FLONASE) 50 MCG/ACT nasal spray    Sig: Place 2 sprays into the nose daily.    Dispense:  16 g    Refill:  12     Hannah Beat, MD

## 2012-02-06 LAB — SYNOVIAL CELL COUNT + DIFF, W/ CRYSTALS: WBC, Synovial: 3730 cu mm — ABNORMAL HIGH (ref 0–200)

## 2012-02-06 LAB — GLUCOSE, SYNOVIAL FLUID: Glucose, Synovial Fluid: <20 mg/dL

## 2012-02-07 ENCOUNTER — Encounter: Payer: Self-pay | Admitting: *Deleted

## 2012-02-09 LAB — BODY FLUID CULTURE
Gram Stain: NONE SEEN
Organism ID, Bacteria: NO GROWTH

## 2012-05-06 ENCOUNTER — Encounter: Payer: Self-pay | Admitting: Internal Medicine

## 2012-05-06 ENCOUNTER — Encounter: Payer: Self-pay | Admitting: Family Medicine

## 2012-05-06 ENCOUNTER — Ambulatory Visit (INDEPENDENT_AMBULATORY_CARE_PROVIDER_SITE_OTHER): Payer: BC Managed Care – PPO | Admitting: Family Medicine

## 2012-05-06 VITALS — BP 124/90 | HR 72 | Temp 98.2°F | Wt 242.2 lb

## 2012-05-06 DIAGNOSIS — R0982 Postnasal drip: Secondary | ICD-10-CM

## 2012-05-06 DIAGNOSIS — Z1211 Encounter for screening for malignant neoplasm of colon: Secondary | ICD-10-CM

## 2012-05-06 DIAGNOSIS — J309 Allergic rhinitis, unspecified: Secondary | ICD-10-CM

## 2012-05-06 DIAGNOSIS — I10 Essential (primary) hypertension: Secondary | ICD-10-CM

## 2012-05-06 NOTE — Patient Instructions (Addendum)
REFERRAL: GO THE THE FRONT ROOM AT THE ENTRANCE OF OUR CLINIC, NEAR CHECK IN. ASK FOR MARION. SHE WILL HELP YOU SET UP YOUR REFERRAL. DATE: TIME:  

## 2012-05-06 NOTE — Progress Notes (Signed)
Nature conservation officer at [facility] [address] [place] [address] Phone: [phone] Fax: [phone]  Date:  05/06/2012   Name:  Timothy Wang   DOB:  Oct 21, 1950   MRN:  [id number] Gender: male Age: 61 y.o.  PCP:  Hannah Beat, MD    Chief Complaint: Follow-up and Nausea   History of Present Illness:  Timothy Wang is a 61 y.o. very pleasant male patient who presents with the following:  Postnasal drop, and will wake up with some decreased moisture, will use some apple cider vinegar.  Then will use alcolol. With a mixture of camphor menthol and sale. ? Draining all the way to the stomach and worrying. BM are later in the monring. Maybe a mucous discharge. No blood in stool. But does not really seem normal. A lot of flatulance. As if things will not reallly digest and will fulmintate. 4-5 a week. He also has a small ventral hernia. It is causing him some discomfort when he is doing some lifting.  Followup RIGHT knee pain: Patient's knee was aspirated and injected on his last office visit, and since then he has done well and has decreased pain and symptoms and now his knee feels effectively fine.  Had a colonoscopy with Dr. Maryruth Bun. More than   Past Medical History, Surgical History, Social History, Family History, Problem List, Medications, and Allergies have been reviewed and updated if relevant.  Current Outpatient Prescriptions on File Prior to Visit  Medication Sig Dispense Refill  . fluticasone (FLONASE) 50 MCG/ACT nasal spray Place 2 sprays into the nose daily.  16 g  12  . simvastatin (ZOCOR) 40 MG tablet TAKE ONE TABLET BY MOUTH AT BEDTIME  30 tablet  11  . venlafaxine XR (EFFEXOR-XR) 37.5 MG 24 hr capsule Take 1 capsule (37.5 mg total) by mouth daily.  30 capsule  4  . lisinopril-hydrochlorothiazide (PRINZIDE,ZESTORETIC) 10-12.5 MG per tablet Take 1 tablet by mouth daily.  90 tablet  3    Review of Systems: As above. Some nausea. No vomiting. No diarrhea. Some  change in the stool. No fever, chills or weight loss.  Physical Examination: Filed Vitals:   05/06/12 1512  BP: 124/90  Pulse: 72  Temp: 98.2 F (36.8 C)   Filed Vitals:   05/06/12 1512  Weight: 242 lb 4 oz (109.884 kg)   There is no height on file to calculate BMI. Ideal Body Weight:     GEN: WDWN, NAD, Non-toxic, A & O x 3 HEENT: Atraumatic, Normocephalic. Neck supple. No masses, No LAD. Ears and Nose: No external deformity. CV: RRR, No M/G/R. No JVD. No thrill. No extra heart sounds. PULM: CTA B, no wheezes, crackles, rhonchi. No retractions. No resp. distress. No accessory muscle use. GI: ventral hernia. S, nt, nd, + BS MSK: Full extension of the knee in flexion 125. Mild crepitus. No significant tenderness on the joint line. Stable ligaments. Negative flexion pinch EXTR: No c/c/e NEURO Normal gait.  PSYCH: Normally interactive. Conversant. Not depressed or anxious appearing.  Calm demeanor.    Assessment and Plan:  1. Special screening for malignant neoplasm of colon  Ambulatory referral to Gastroenterology  2. Postnasal drip    3. Allergic rhinitis    4. HYPERTENSION, ESSENTIAL NOS  lisinopril-hydrochlorothiazide (PRINZIDE,ZESTORETIC) 10-12.5 MG per tablet   I think most of this is postnasal drip and allergic rhinitis. His regimen his taking care of things fairly well. We also discussed some basic things like ginger but he is going  try for some occasional nausea. He has been almost 10 years since his last colonoscopy, and so I think it is reasonable for him to see GI in likely have a repeat colonoscopy even from a screening standpoint  Orders Today:  Orders Placed This Encounter  Procedures  . Ambulatory referral to Gastroenterology    Referral Priority:  Routine    Referral Type:  Consultation    Referral Reason:  Specialty Services Required    Requested Specialty:  Gastroenterology    Number of Visits Requested:  1    Updated Medication List: (Includes new  medications, updates to list, dose adjustments) Meds ordered this encounter  Medications  . lisinopril-hydrochlorothiazide (PRINZIDE,ZESTORETIC) 10-12.5 MG per tablet    Sig: Take 1 tablet by mouth daily.    Dispense:  1 tablet    Refill:  0    Medications Discontinued: Medications Discontinued During This Encounter  Medication Reason  . lisinopril-hydrochlorothiazide (PRINZIDE,ZESTORETIC) 10-12.5 MG per tablet Entry Error     Hannah Beat, MD

## 2012-05-07 MED ORDER — LISINOPRIL-HYDROCHLOROTHIAZIDE 10-12.5 MG PO TABS: 1.0000 | ORAL_TABLET | Freq: Every day | ORAL | Status: DC

## 2012-05-18 ENCOUNTER — Other Ambulatory Visit: Payer: Self-pay | Admitting: Family Medicine

## 2012-05-20 ENCOUNTER — Ambulatory Visit (AMBULATORY_SURGERY_CENTER): Payer: BC Managed Care – PPO | Admitting: *Deleted

## 2012-05-20 ENCOUNTER — Encounter: Payer: Self-pay | Admitting: Internal Medicine

## 2012-05-20 VITALS — Ht 73.0 in | Wt 245.0 lb

## 2012-05-20 DIAGNOSIS — Z1211 Encounter for screening for malignant neoplasm of colon: Secondary | ICD-10-CM

## 2012-05-20 MED ORDER — NA SULFATE-K SULFATE-MG SULF 17.5-3.13-1.6 GM/177ML PO SOLN: ORAL | Status: DC

## 2012-05-20 NOTE — Telephone Encounter (Signed)
Received refill request electronically for Simvastatin. Advise when patient needs to have lab work? Is it okay to refill medication?

## 2012-05-31 ENCOUNTER — Encounter: Payer: Self-pay | Admitting: Internal Medicine

## 2012-05-31 ENCOUNTER — Ambulatory Visit (AMBULATORY_SURGERY_CENTER): Payer: BC Managed Care – PPO | Admitting: Internal Medicine

## 2012-05-31 VITALS — BP 112/77 | HR 78 | Temp 97.3°F | Resp 18 | Ht 73.0 in | Wt 245.0 lb

## 2012-05-31 DIAGNOSIS — D126 Benign neoplasm of colon, unspecified: Secondary | ICD-10-CM

## 2012-05-31 DIAGNOSIS — D128 Benign neoplasm of rectum: Secondary | ICD-10-CM

## 2012-05-31 DIAGNOSIS — K5730 Diverticulosis of large intestine without perforation or abscess without bleeding: Secondary | ICD-10-CM

## 2012-05-31 DIAGNOSIS — Z1211 Encounter for screening for malignant neoplasm of colon: Secondary | ICD-10-CM

## 2012-05-31 DIAGNOSIS — D129 Benign neoplasm of anus and anal canal: Secondary | ICD-10-CM

## 2012-05-31 MED ORDER — SODIUM CHLORIDE 0.9 % IV SOLN: 500.0000 mL | INTRAVENOUS | Status: DC

## 2012-05-31 NOTE — Progress Notes (Signed)
Patient did not experience any of the following events: a burn prior to discharge; a fall within the facility; wrong site/side/patient/procedure/implant event; or a hospital transfer or hospital admission upon discharge from the facility. (G8907) Patient did not have preoperative order for IV antibiotic SSI prophylaxis. (G8918)  

## 2012-05-31 NOTE — Op Note (Signed)
[facility] [address] [place], [address]   COLONOSCOPY PROCEDURE REPORT  PATIENT: Timothy, Wang  MR#: [id number] BIRTHDATE: 04-30-1951 , 61  yrs. old GENDER: Male ENDOSCOPIST: Iva Boop, MD, [facility] REFERRED XB:[id number] Ward Chatters, M.D. PROCEDURE DATE:  05/31/2012 PROCEDURE:   Colonoscopy with biopsy ASA CLASS:   Class II INDICATIONS:average risk screening. MEDICATIONS: Propofol (Diprivan) 180 mg IV, MAC sedation, administered by CRNA, and These medications were titrated to patient response per physician's verbal order  DESCRIPTION OF PROCEDURE:   After the risks benefits and alternatives of the procedure were thoroughly explained, informed consent was obtained.  A digital rectal exam revealed no abnormalities of the rectum and A digital rectal exam revealed the prostate was not enlarged.   The LB [id number]  endoscope was introduced through the anus and advanced to the cecum, which was identified by both the appendix and ileocecal valve. No adverse events experienced.   The quality of the prep was Suprep excellent The instrument was then slowly withdrawn as the colon was fully examined.      COLON FINDINGS: A polypoid shaped sessile polyp measuring 3 mm in size was found in the rectum.  A polypectomy was performed with cold forceps.  The resection was complete and the polyp tissue was completely retrieved.   Mild diverticulosis was noted in the sigmoid colon.   The colon mucosa was otherwise normal. Retroflexed views in right colon and rectum revealed no abnormalities. The time to cecum=3 minutes 38 seconds.  Withdrawal time=10 minutes 13 seconds.  The scope was withdrawn and the procedure completed. COMPLICATIONS: There were no complications.  ENDOSCOPIC IMPRESSION: 1.   Sessile polyp measuring 3 mm in size was found in the rectum; polypectomy was performed with cold forceps 2.   Mild diverticulosis was noted in the sigmoid colon 3.   The  colon mucosa was otherwise normal  RECOMMENDATIONS: Timing of repeat colonoscopy will be determined by pathology findings.   eSigned:  Iva Boop, MD, [facility] 05/31/2012 8:55 AM   cc: Juleen China, MD and The Patient  PATIENT NAME:  Timothy, Wang MR#: [id number]

## 2012-05-31 NOTE — Patient Instructions (Addendum)
One tiny polyp was removed - it looks benign. I will find out if it was pre-cancerous or not and let you timing of next colonoscopy. You also have diverticulosis.  Thank you for choosing me and [facility]. Iva Boop, MD, FACG YOU HAD AN ENDOSCOPIC PROCEDURE TODAY AT THE [facility]: Refer to the procedure report that was given to you for any specific questions about what was found during the examination.  If the procedure report does not answer your questions, please call your gastroenterologist to clarify.  If you requested that your care partner not be given the details of your procedure findings, then the procedure report has been included in a sealed envelope for you to review at your convenience later.  YOU SHOULD EXPECT: Some feelings of bloating in the abdomen. Passage of more gas than usual.  Walking can help get rid of the air that was put into your GI tract during the procedure and reduce the bloating. If you had a lower endoscopy (such as a colonoscopy or flexible sigmoidoscopy) you may notice spotting of blood in your stool or on the toilet paper. If you underwent a bowel prep for your procedure, then you may not have a normal bowel movement for a few days.  DIET: Your first meal following the procedure should be a light meal and then it is ok to progress to your normal diet.  A half-sandwich or bowl of soup is an example of a good first meal.  Heavy or fried foods are harder to digest and may make you feel nauseous or bloated.  Likewise meals heavy in dairy and vegetables can cause extra gas to form and this can also increase the bloating.  Drink plenty of fluids but you should avoid alcoholic beverages for 24 hours.  ACTIVITY: Your care partner should take you home directly after the procedure.  You should plan to take it easy, moving slowly for the rest of the day.  You can resume normal activity the day after the procedure however you should NOT DRIVE or  use heavy machinery for 24 hours (because of the sedation medicines used during the test).    SYMPTOMS TO REPORT IMMEDIATELY: A gastroenterologist can be reached at any hour.  During normal business hours, 8:30 AM to 5:00 PM Monday through Friday, call [phone].  After hours and on weekends, please call the GI answering service at [phone] who will take a message and have the physician on call contact you.   Following lower endoscopy (colonoscopy or flexible sigmoidoscopy):  Excessive amounts of blood in the stool  Significant tenderness or worsening of abdominal pains  Swelling of the abdomen that is new, acute  Fever of 100F or higher  Following upper endoscopy (EGD)  Vomiting of blood or coffee ground material  New chest pain or pain under the shoulder blades  Painful or persistently difficult swallowing  New shortness of breath  Fever of 100F or higher  Black, tarry-looking stools  FOLLOW UP: If any biopsies were taken you will be contacted by phone or by letter within the next 1-3 weeks.  Call your gastroenterologist if you have not heard about the biopsies in 3 weeks.  Our staff will call the home number listed on your records the next business day following your procedure to check on you and address any questions or concerns that you may have at that time regarding the information given to you following your procedure. This is a courtesy call  and so if there is no answer at the home number and we have not heard from you through the emergency physician on call, we will assume that you have returned to your regular daily activities without incident.  SIGNATURES/CONFIDENTIALITY: You and/or your care partner have signed paperwork which will be entered into your electronic medical record.  These signatures attest to the fact that that the information above on your After Visit Summary has been reviewed and is understood.  Full responsibility of the confidentiality of this  discharge information lies with you and/or your care-partner.

## 2012-05-31 NOTE — Progress Notes (Signed)
1610 a/ox3,pleased with MAC, report to [employer]

## 2012-06-03 ENCOUNTER — Telehealth: Payer: Self-pay | Admitting: *Deleted

## 2012-06-03 NOTE — Telephone Encounter (Signed)
  Follow up Call-  Call back number 05/31/2012  Post procedure Call Back phone  # [phone]   Permission to leave phone message Yes     Patient questions:  Do you have a fever, pain , or abdominal swelling? no Pain Score  0 *  Have you tolerated food without any problems? yes  Have you been able to return to your normal activities? yes  Do you have any questions about your discharge instructions: Diet   no Medications  no Follow up visit  no  Do you have questions or concerns about your Care? no  Actions: * If pain score is 4 or above: No action needed, pain <4.

## 2012-06-04 ENCOUNTER — Encounter: Payer: Self-pay | Admitting: Internal Medicine

## 2012-06-04 NOTE — Progress Notes (Signed)
Quick Note:  Hyperplastic polyp Repeat colon 2023 ______

## 2012-07-28 ENCOUNTER — Other Ambulatory Visit: Payer: Self-pay | Admitting: Family Medicine

## 2012-11-18 ENCOUNTER — Encounter: Payer: Self-pay | Admitting: Family Medicine

## 2012-11-18 ENCOUNTER — Ambulatory Visit (INDEPENDENT_AMBULATORY_CARE_PROVIDER_SITE_OTHER): Payer: BC Managed Care – PPO | Admitting: Family Medicine

## 2012-11-18 VITALS — BP 110/72 | HR 92 | Temp 98.4°F | Ht 73.0 in | Wt 240.5 lb

## 2012-11-18 DIAGNOSIS — J069 Acute upper respiratory infection, unspecified: Secondary | ICD-10-CM

## 2012-11-18 MED ORDER — VENLAFAXINE HCL ER 37.5 MG PO CP24: ORAL_CAPSULE | ORAL | Status: DC

## 2012-11-18 MED ORDER — FLUTICASONE PROPIONATE 50 MCG/ACT NA SUSP: 2.0000 | Freq: Every day | NASAL | Status: DC | PRN

## 2012-11-18 NOTE — Progress Notes (Signed)
Patient Name: Timothy Wang Date of Birth: [birth year] Medical Record Number: [id number]  History of Present Illness:  Patent presents with runny nose, sneezing, cough, sore throat, malaise and minimal / low-grade fever .  Friday, came down with a bad bug. Went home and hardly got out of bed for the last several days. Could not really sleep. Has had a lot of post-nasal drip.   Has some allergic rhinitis.   ? recent exposure to others with similar symptoms.   The patent denies sore throat as the primary complaint. Denies sthortness of breath/wheezing, high fever, chest pain, rhinits for more than 14 days, significant myalgia, otalgia, facial pain, abdominal pain, changes in bowel or bladder.  PMH, PHS, Allergies, Problem List, Medications, Family History, and Social History have all been reviewed.  Patient Active Problem List  Diagnosis  . HYPERLIPIDEMIA  . DEPRESSION  . ALLERGIC RHINITIS  . HERNIA, VENTRAL  . FATIGUE  . CERUMEN IMPACTION, BILATERAL  . HYPERTENSION, ESSENTIAL NOS    Past Medical History  Diagnosis Date  . Allergic rhinitis due to pollen   . Depression   . H/O: Bell's palsy   . Hyperlipidemia   . Ventral hernia     Past Surgical History  Procedure Laterality Date  . Tonsillectomy  1960  . Heel spur surgery  1987    History   Social History  . Marital Status: Single    Spouse Name: N/A    Number of Children: N/A  . Years of Education: N/A   Occupational History  . sales    Social History Main Topics  . Smoking status: Former Games developer  . Smokeless tobacco: Never Used  . Alcohol Use: 2.4 oz/week    4 Cans of beer per week  . Drug Use: No  . Sexually Active: Not on file   Other Topics Concern  . Not on file   Social History Narrative  . No narrative on file    Family History  Problem Relation Age of Onset  . Alcohol abuse Father   . Heart attack Father   . Thyroid disease Sister   . Depression Sister   . Cancer Sister     breast    . Pancreatic cancer Mother     No Known Allergies  Current Outpatient Prescriptions on File Prior to Visit  Medication Sig Dispense Refill  . aspirin 325 MG EC tablet Take 650 mg by mouth every 6 (six) hours as needed.      . famotidine (PEPCID) 10 MG tablet Take 10 mg by mouth 2 (two) times daily as needed. indigestion      . ibuprofen (ADVIL,MOTRIN) 200 MG tablet Take 400 mg by mouth every 6 (six) hours as needed.      Marland Kitchen lisinopril-hydrochlorothiazide (PRINZIDE,ZESTORETIC) 10-12.5 MG per tablet Take 1 tablet by mouth daily.  1 tablet  0  . simvastatin (ZOCOR) 40 MG tablet TAKE ONE TABLET BY MOUTH AT BEDTIME  30 tablet  10  . [DISCONTINUED] lisinopril-hydrochlorothiazide (PRINZIDE,ZESTORETIC) 10-12.5 MG per tablet Take 1 tablet by mouth daily.  90 tablet  3   No current facility-administered medications on file prior to visit.    Review of Systems: as above, eating and drinking - tolerating PO. Urinating normally. No excessive vomitting or diarrhea. O/w as above.  Physical Exam:  Filed Vitals:   11/18/12 1006  BP: 110/72  Pulse: 92  Temp: 98.4 F (36.9 C)  TempSrc: Oral  Height: 6\' 1"  (1.854 m)  Weight: 240 lb  8 oz (109.09 kg)  SpO2: 96%    GEN: WDWN, Non-toxic, Atraumatic, normocephalic. A and O x 3. HEENT: Oropharynx clear without exudate, MMM, no significant LAD, mild rhinnorhea Ears: TM clear, COL visualized with good landmarks CV: RRR, no m/g/r. Pulm: CTA B, no wheezes, rhonchi, or crackles, normal respiratory effort. EXT: no c/c/e Psych: well oriented, neither depressed nor anxious in appearance  A/P: 1. URI. Supportive care reviewed with patient. See patient instruction section.

## 2013-02-13 ENCOUNTER — Other Ambulatory Visit (INDEPENDENT_AMBULATORY_CARE_PROVIDER_SITE_OTHER): Payer: No Typology Code available for payment source

## 2013-02-13 DIAGNOSIS — E7800 Pure hypercholesterolemia, unspecified: Secondary | ICD-10-CM

## 2013-02-13 DIAGNOSIS — Z79899 Other long term (current) drug therapy: Secondary | ICD-10-CM

## 2013-02-13 DIAGNOSIS — Z125 Encounter for screening for malignant neoplasm of prostate: Secondary | ICD-10-CM

## 2013-02-13 LAB — COMPREHENSIVE METABOLIC PANEL
Albumin: 3.8 g/dL (ref 3.5–5.2)
BUN: 14 mg/dL (ref 6–23)
CO2: 31 mEq/L (ref 19–32)
Calcium: 9.3 mg/dL (ref 8.4–10.5)
Chloride: 103 mEq/L (ref 96–112)
GFR: 89.92 mL/min (ref >60.00)
Glucose, Bld: 82 mg/dL (ref 70–99)
Potassium: 4.9 mEq/L (ref 3.5–5.1)
Sodium: 140 mEq/L (ref 135–145)
Total Protein: 6.4 g/dL (ref 6.0–8.3)

## 2013-02-13 LAB — CBC WITH DIFFERENTIAL/PLATELET
Eosinophils Relative: 2.4 % (ref 0.0–5.0)
Lymphocytes Relative: 25.9 % (ref 12.0–46.0)
MCV: 91.3 fl (ref 78.0–100.0)
Monocytes Absolute: 0.5 10*3/uL (ref 0.1–1.0)
Monocytes Relative: 12 % (ref 3.0–12.0)
Neutrophils Relative %: 59.2 % (ref 43.0–77.0)
Platelets: 240 10*3/uL (ref 150.0–400.0)
WBC: 4.5 10*3/uL (ref 4.5–10.5)

## 2013-02-13 LAB — PSA: PSA: 0.55 ng/mL (ref 0.10–4.00)

## 2013-02-13 LAB — LIPID PANEL: Cholesterol: 158 mg/dL (ref 0–200)

## 2013-02-17 ENCOUNTER — Encounter: Payer: Self-pay | Admitting: Family Medicine

## 2013-02-17 ENCOUNTER — Ambulatory Visit (INDEPENDENT_AMBULATORY_CARE_PROVIDER_SITE_OTHER): Payer: No Typology Code available for payment source | Admitting: Family Medicine

## 2013-02-17 VITALS — BP 116/76 | HR 84 | Temp 98.1°F | Ht 71.0 in | Wt 239.8 lb

## 2013-02-17 DIAGNOSIS — Z0000 Encounter for general adult medical examination without abnormal findings: Secondary | ICD-10-CM

## 2013-02-17 DIAGNOSIS — Z23 Encounter for immunization: Secondary | ICD-10-CM

## 2013-02-17 NOTE — Progress Notes (Signed)
Nature conservation officer at [facility] [address] [place] [address] Phone: [phone] Fax: [phone]  Date:  02/17/2013   Name:  Timothy Wang   DOB:  10/24/1950   MRN:  [id number] Gender: male Age: 62 y.o.  Primary Physician:  Hannah Beat, MD  Evaluating MD: Hannah Beat, MD   Chief Complaint: Annual Exam   History of Present Illness:  Timothy Wang is a 62 y.o. pleasant patient who presents with the following:  CPX:  Zostavax -  Tdap.  Work going [employer].  Still having some increased post-nasal drip. Apple cider vinegar will work and nasal irrigation with salt.   Preventative Health Maintenance Visit:  Health Maintenance Summary Reviewed and updated, unless pt declines services.  Tobacco History Reviewed. Alcohol: No concerns, no excessive use Exercise Habits: minimal right now, rec at least 30 mins 5 times a week STD concerns: no risk or activity to increase risk Drug Use: None Encouraged self-testicular check  Health Maintenance  Topic Date Due  . Zostavax  02/18/2014  . Influenza Vaccine  03/31/2013  . Colonoscopy  05/31/2022  . Tetanus/tdap  02/18/2023    Labs reviewed with the patient.  Results for orders placed in visit on 02/13/13  LIPID PANEL      Result Value Range   Cholesterol 158  0 - 200 mg/dL   Triglycerides 24.4  0.0 - 149.0 mg/dL   HDL 01.02  >72.53 mg/dL   VLDL 66.4  0.0 - 40.3 mg/dL   LDL Cholesterol 474 (*) 0 - 99 mg/dL   Total CHOL/HDL Ratio 4    COMPREHENSIVE METABOLIC PANEL      Result Value Range   Sodium 140  135 - 145 mEq/L   Potassium 4.9  3.5 - 5.1 mEq/L   Chloride 103  96 - 112 mEq/L   CO2 31  19 - 32 mEq/L   Glucose, Bld 82  70 - 99 mg/dL   BUN 14  6 - 23 mg/dL   Creatinine, Ser 0.9  0.4 - 1.5 mg/dL   Total Bilirubin 0.7  0.3 - 1.2 mg/dL   Alkaline Phosphatase 97  39 - 117 U/L   AST 21  0 - 37 U/L   ALT 23  0 - 53 U/L   Total Protein 6.4  6.0 - 8.3 g/dL   Albumin 3.8  3.5 - 5.2 g/dL   Calcium 9.3   8.4 - 25.9 mg/dL   GFR 56.38  >75.64 mL/min  CBC WITH DIFFERENTIAL      Result Value Range   WBC 4.5  4.5 - 10.5 K/uL   RBC 5.04  4.22 - 5.81 Mil/uL   Hemoglobin 15.5  13.0 - 17.0 g/dL   HCT 33.2  95.1 - 88.4 %   MCV 91.3  78.0 - 100.0 fl   MCHC 33.7  30.0 - 36.0 g/dL   RDW 16.6  06.3 - 01.6 %   Platelets 240.0  150.0 - 400.0 K/uL   Neutrophils Relative % 59.2  43.0 - 77.0 %   Lymphocytes Relative 25.9  12.0 - 46.0 %   Monocytes Relative 12.0  3.0 - 12.0 %   Eosinophils Relative 2.4  0.0 - 5.0 %   Basophils Relative 0.5  0.0 - 3.0 %   Neutro Abs 2.7  1.4 - 7.7 K/uL   Lymphs Abs 1.2  0.7 - 4.0 K/uL   Monocytes Absolute 0.5  0.1 - 1.0 K/uL   Eosinophils Absolute 0.1  0.0 - 0.7 K/uL  Basophils Absolute 0.0  0.0 - 0.1 K/uL  PSA      Result Value Range   PSA 0.55  0.10 - 4.00 ng/mL     Patient Active Problem List   Diagnosis Date Noted  . CERUMEN IMPACTION, BILATERAL 08/04/2010  . HYPERTENSION, ESSENTIAL NOS 08/04/2010  . HYPERLIPIDEMIA 05/28/2008  . HERNIA, VENTRAL 05/28/2008  . FATIGUE 05/28/2008  . DEPRESSION 04/21/2008  . ALLERGIC RHINITIS 04/21/2008    Past Medical History  Diagnosis Date  . Allergic rhinitis due to pollen   . Depression   . H/O: Bell's palsy   . Hyperlipidemia   . Ventral hernia     Past Surgical History  Procedure Laterality Date  . Tonsillectomy  1960  . Heel spur surgery  1987    History   Social History  . Marital Status: Single    Spouse Name: N/A    Number of Children: N/A  . Years of Education: N/A   Occupational History  . sales    Social History Main Topics  . Smoking status: Former Games developer  . Smokeless tobacco: Never Used     Comment: quit 2004  . Alcohol Use: 2.4 oz/week    4 Cans of beer per week  . Drug Use: No  . Sexually Active: Not on file   Other Topics Concern  . Not on file   Social History Narrative  . No narrative on file    Family History  Problem Relation Age of Onset  . Alcohol abuse Father     . Heart attack Father   . Thyroid disease Sister   . Depression Sister   . Cancer Sister     breast  . Pancreatic cancer Mother     No Known Allergies  Medication list has been reviewed and updated.  Outpatient Prescriptions Prior to Visit  Medication Sig Dispense Refill  . aspirin 325 MG EC tablet Take 650 mg by mouth every 6 (six) hours as needed.      . famotidine (PEPCID) 10 MG tablet Take 10 mg by mouth 2 (two) times daily as needed. indigestion      . fluticasone (FLONASE) 50 MCG/ACT nasal spray Place 2 sprays into the nose daily as needed.  16 g  5  . ibuprofen (ADVIL,MOTRIN) 200 MG tablet Take 400 mg by mouth every 6 (six) hours as needed.      . simvastatin (ZOCOR) 40 MG tablet TAKE ONE TABLET BY MOUTH AT BEDTIME  30 tablet  10  . venlafaxine XR (EFFEXOR-XR) 37.5 MG 24 hr capsule TAKE ONE CAPSULE BY MOUTH EVERY DAY  30 capsule  5  . lisinopril-hydrochlorothiazide (PRINZIDE,ZESTORETIC) 10-12.5 MG per tablet Take 1 tablet by mouth daily.  1 tablet  0   No facility-administered medications prior to visit.    Review of Systems:   General: Denies fever, chills, sweats. No significant weight loss. Eyes: Denies blurring,significant itching ENT: Denies earache, sore throat, and hoarseness. Cardiovascular: Denies chest pains, palpitations, dyspnea on exertion Respiratory: Denies cough, dyspnea at rest,wheeezing Breast: no concerns about lumps GI: Denies nausea, vomiting, diarrhea, constipation, change in bowel habits, abdominal pain, melena, hematochezia GU: Denies penile discharge, ED, urinary flow / outflow problems. No STD concerns. Musculoskeletal: Denies back pain, joint pain Derm: Denies rash, itching Neuro: Denies  paresthesias, frequent falls, frequent headaches Psych: Denies depression, anxiety Endocrine: Denies cold intolerance, heat intolerance, polydipsia Heme: Denies enlarged lymph nodes Allergy: PR HPI Physical Examination: BP 116/76  Pulse 84  Temp(Src)  98.1  F (36.7 C) (Oral)  Ht 5\' 11"  (1.803 m)  Wt 239 lb 12 oz (108.75 kg)  BMI 33.45 kg/m2  Ideal Body Weight: Weight in (lb) to have BMI = 25: 178.9   Wt Readings from Last 3 Encounters:  02/17/13 239 lb 12 oz (108.75 kg)  11/18/12 240 lb 8 oz (109.09 kg)  05/31/12 245 lb (111.131 kg)    GEN: well developed, well nourished, no acute distress Eyes: conjunctiva and lids normal, PERRLA, EOMI ENT: TM clear, nares clear, oral exam WNL Neck: supple, no lymphadenopathy, no thyromegaly, no JVD Pulm: clear to auscultation and percussion, respiratory effort normal CV: regular rate and rhythm, S1-S2, no murmur, rub or gallop, no bruits, peripheral pulses normal and symmetric, no cyanosis, clubbing, edema or varicosities Chest: no scars, masses GI: soft, non-tender; no hepatosplenomegaly, masses; active bowel sounds all quadrants GU: no hernia, testicular mass, penile discharge, or prostate enlargement Lymph: no cervical, axillary or inguinal adenopathy MSK: gait normal, muscle tone and strength WNL, no joint swelling, effusions, discoloration, crepitus  SKIN: clear, good turgor, color WNL, no rashes, lesions, or ulcerations Neuro: normal mental status, normal strength, sensation, and motion Psych: alert; oriented to person, place and time, normally interactive and not anxious or depressed in appearance.  Assessment and Plan:   Routine general medical examination at a health care facility  Immunization due - Plan: Tdap vaccine greater than or equal to 7yo IM  The patient's preventative maintenance and recommended screening tests for an annual wellness exam were reviewed in full today. Brought up to date unless services declined.  Counselled on the importance of diet, exercise, and its role in overall health and mortality. The patient's FH and SH was reviewed, including their home life, tobacco status, and drug and alcohol status.   Overall doing well. Keep working  On  fitness.  Orders Today:  Orders Placed This Encounter  Procedures  . Tdap vaccine greater than or equal to 7yo IM    Updated Medication List: (Includes new medications, updates to list, dose adjustments) No orders of the defined types were placed in this encounter.    Medications Discontinued: There are no discontinued medications.    Signed, Timothy Galea. Jamel Dunton, MD 02/17/2013 3:01 PM

## 2013-06-08 ENCOUNTER — Other Ambulatory Visit: Payer: Self-pay | Admitting: Family Medicine

## 2013-07-31 HISTORY — PX: KNEE SURGERY: SHX244

## 2013-08-15 ENCOUNTER — Other Ambulatory Visit: Payer: Self-pay | Admitting: Family Medicine

## 2013-11-29 ENCOUNTER — Other Ambulatory Visit: Payer: Self-pay | Admitting: Family Medicine

## 2013-12-30 ENCOUNTER — Other Ambulatory Visit: Payer: Self-pay | Admitting: Family Medicine

## 2014-02-10 IMAGING — CR DG KNEE STANDING AP BILAT
1 series · 1 of 1 positions shown · non-contrast
Comparison: None.

CLINICAL DATA: Right knee pain.

BILATERAL KNEES STANDING - 1 VIEW,RIGHT KNEE - 1-2 VIEW

[view not recorded]
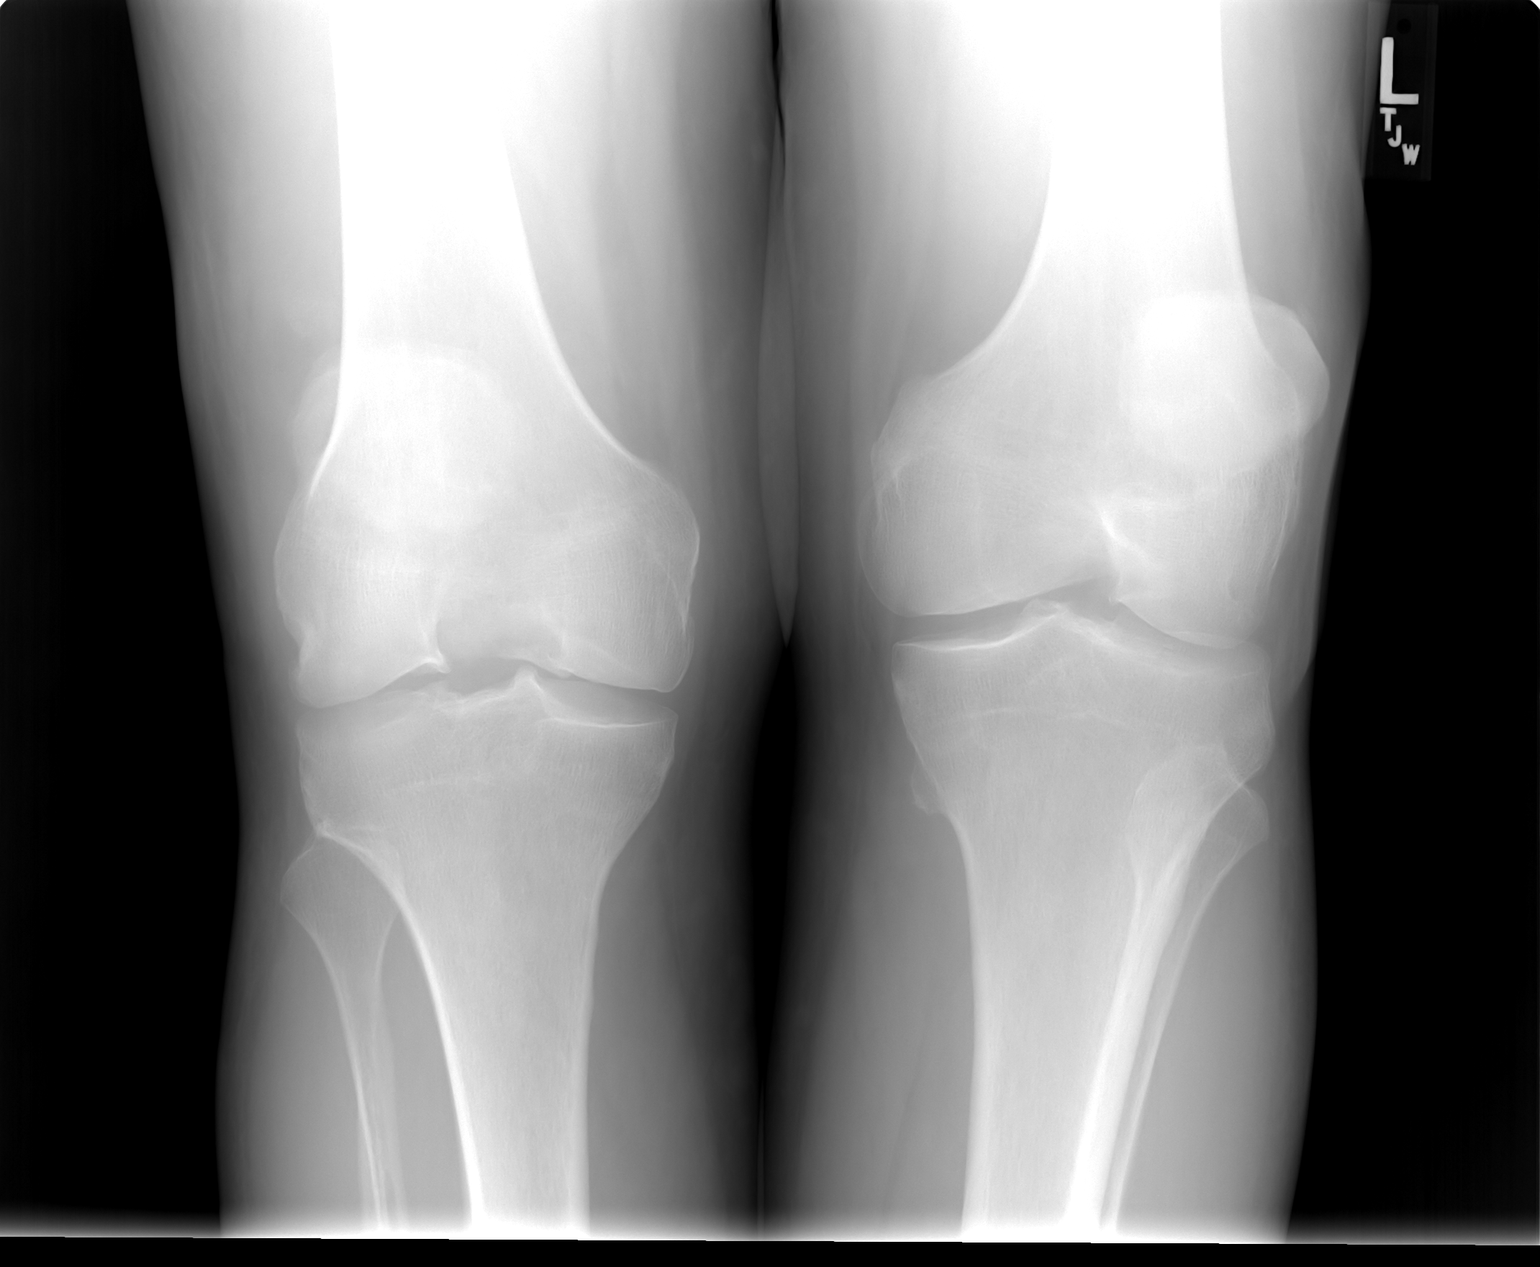

[1 of 1 positions shown; findings below may reference images not displayed]

FINDINGS: Right knee three views:  Moderate patellofemoral joint degenerative
changes with osteophyte formation.  Mild medial and lateral
tibiofemoral joint space degenerative changes.

Moderate to large size right suprapatellar joint effusion may
contain a loose body.  Alternatively, this may represent bony
overgrowth of the anterior margin of the distal right femur.

Single frontal view left knee:  Mild left lateral tibial femoral
joint space narrowing.  Mild lateral subluxation of the left
patella may be present versus rotation of the leg.
IMPRESSION: Right knee three views:  Moderate patellofemoral joint degenerative
changes with osteophyte formation.  Mild medial and lateral
tibiofemoral joint space degenerative changes.

Moderate to large size right suprapatellar joint effusion may
contain a loose body.  Alternatively, this may represent bony
overgrowth of the anterior margin of the distal right femur.

Single frontal view left knee:  Mild left lateral tibial femoral
joint space narrowing.  Mild lateral subluxation of the left
patella may be present versus rotation of the leg.

## 2014-03-18 ENCOUNTER — Other Ambulatory Visit: Payer: Self-pay | Admitting: Family Medicine

## 2014-04-30 ENCOUNTER — Encounter: Payer: Self-pay | Admitting: Family Medicine

## 2014-04-30 ENCOUNTER — Ambulatory Visit (INDEPENDENT_AMBULATORY_CARE_PROVIDER_SITE_OTHER): Payer: No Typology Code available for payment source | Admitting: Family Medicine

## 2014-04-30 VITALS — BP 140/84 | HR 77 | Temp 98.2°F | Ht 71.0 in | Wt 237.2 lb

## 2014-04-30 DIAGNOSIS — E785 Hyperlipidemia, unspecified: Secondary | ICD-10-CM

## 2014-04-30 DIAGNOSIS — Z131 Encounter for screening for diabetes mellitus: Secondary | ICD-10-CM

## 2014-04-30 DIAGNOSIS — R5383 Other fatigue: Secondary | ICD-10-CM

## 2014-04-30 DIAGNOSIS — Z209 Contact with and (suspected) exposure to unspecified communicable disease: Secondary | ICD-10-CM

## 2014-04-30 DIAGNOSIS — Z23 Encounter for immunization: Secondary | ICD-10-CM

## 2014-04-30 DIAGNOSIS — Z125 Encounter for screening for malignant neoplasm of prostate: Secondary | ICD-10-CM

## 2014-04-30 DIAGNOSIS — I10 Essential (primary) hypertension: Secondary | ICD-10-CM

## 2014-04-30 LAB — CBC WITH DIFFERENTIAL/PLATELET
BASOS ABS: 0 10*3/uL (ref 0.0–0.1)
Basophils Relative: 0.5 % (ref 0.0–3.0)
EOS PCT: 1.2 % (ref 0.0–5.0)
Eosinophils Absolute: 0.1 10*3/uL (ref 0.0–0.7)
HCT: 45.6 % (ref 39.0–52.0)
Hemoglobin: 15.1 g/dL (ref 13.0–17.0)
LYMPHS PCT: 21.2 % (ref 12.0–46.0)
Lymphs Abs: 1.2 10*3/uL (ref 0.7–4.0)
MCHC: 33.2 g/dL (ref 30.0–36.0)
MCV: 91.4 fl (ref 78.0–100.0)
MONOS PCT: 9.2 % (ref 3.0–12.0)
Monocytes Absolute: 0.5 10*3/uL (ref 0.1–1.0)
NEUTROS ABS: 3.8 10*3/uL (ref 1.4–7.7)
Neutrophils Relative %: 67.9 % (ref 43.0–77.0)
Platelets: 281 10*3/uL (ref 150.0–400.0)
RBC: 4.98 Mil/uL (ref 4.22–5.81)
RDW: 14.3 % (ref 11.5–15.5)
WBC: 5.6 10*3/uL (ref 4.0–10.5)

## 2014-04-30 LAB — LIPID PANEL
CHOLESTEROL: 226 mg/dL — AB (ref 0–200)
HDL: 45.1 mg/dL (ref >39.00)
LDL CALC: 164 mg/dL — AB (ref 0–99)
NonHDL: 180.9
Total CHOL/HDL Ratio: 5
Triglycerides: 83 mg/dL (ref 0.0–149.0)
VLDL: 16.6 mg/dL (ref 0.0–40.0)

## 2014-04-30 LAB — HEPATIC FUNCTION PANEL
ALBUMIN: 4.1 g/dL (ref 3.5–5.2)
ALK PHOS: 102 U/L (ref 39–117)
ALT: 29 U/L (ref 0–53)
AST: 26 U/L (ref 0–37)
BILIRUBIN TOTAL: 0.7 mg/dL (ref 0.2–1.2)
Bilirubin, Direct: 0.2 mg/dL (ref 0.0–0.3)
Total Protein: 7.2 g/dL (ref 6.0–8.3)

## 2014-04-30 LAB — BASIC METABOLIC PANEL
BUN: 17 mg/dL (ref 6–23)
CHLORIDE: 103 meq/L (ref 96–112)
CO2: 33 mEq/L — ABNORMAL HIGH (ref 19–32)
CREATININE: 0.9 mg/dL (ref 0.4–1.5)
Calcium: 9.2 mg/dL (ref 8.4–10.5)
GFR: 88.44 mL/min (ref >60.00)
GLUCOSE: 81 mg/dL (ref 70–99)
Potassium: 4.6 mEq/L (ref 3.5–5.1)
Sodium: 137 mEq/L (ref 135–145)

## 2014-04-30 MED ORDER — SIMVASTATIN 40 MG PO TABS: ORAL_TABLET | ORAL | Status: DC

## 2014-04-30 NOTE — Progress Notes (Signed)
Pre visit review using our clinic review tool, if applicable. No additional management support is needed unless otherwise documented below in the visit note. 

## 2014-04-30 NOTE — Progress Notes (Signed)
Dr. Frederico Hamman T. Neville Walston, MD, [facility] [place], [address] Phone: [phone] Fax: [phone]  04/30/2014  Patient: Timothy Wang, MRN: [id number], DOB: 1950-10-24, 63 y.o.  Primary Physician:  Owens Loffler, MD  Chief Complaint: Medication Refill  Subjective:   [name] is a 63 y.o. very pleasant male patient who presents with the following:  Left knee, Workers comp on the left knee. Had some swelling. Took some Naprosyn.   HTN: Tolerating all medications without side effects Stable and at goal No CP, no sob. No HA.  BP Readings from Last 3 Encounters:  04/30/14 140/84  02/17/13 116/76  11/18/12 110/72   Lipids: Doing well, stable. Tolerating meds fine with no SE. Panel reviewed with patient.  Lipids:    Component Value Date/Time   CHOL 158 02/13/2013 0822   TRIG 68.0 02/13/2013 0822   HDL 40.70 02/13/2013 0822   LDLDIRECT 172.7 04/20/2010 1006   VLDL 13.6 02/13/2013 0822   CHOLHDL 4 02/13/2013 0822    Lab Results  Component Value Date   ALT 23 02/13/2013   AST 21 02/13/2013   ALKPHOS 97 02/13/2013   BILITOT 0.7 02/13/2013    STD screen: He also tells me he has had 2 or 3 partners in the last year, so he is interested in having some STD screening.  Past Medical History, Surgical History, Social History, Family History, Problem List, Medications, and Allergies have been reviewed and updated if relevant.   GEN: No acute illnesses, no fevers, chills. GI: No n/v/d, eating normally Pulm: No SOB Interactive and getting along well at home.  Otherwise, ROS is as per the HPI.  Objective:   BP 140/84  Pulse 77  Temp(Src) 98.2 F (36.8 C) (Oral)  Ht 5\' 11"  (1.803 m)  Wt 237 lb 4 oz (107.616 kg)  BMI 33.10 kg/m2  GEN: WDWN, NAD, Non-toxic, A & O x 3 HEENT: Atraumatic, Normocephalic. Neck supple. No masses, No LAD. Ears and Nose: No external deformity. CV: RRR, No M/G/R. No JVD. No thrill. No  extra heart sounds. PULM: CTA B, no wheezes, crackles, rhonchi. No retractions. No resp. distress. No accessory muscle use. EXTR: No c/c/e NEURO Normal gait.  PSYCH: Normally interactive. Conversant. Not depressed or anxious appearing.  Calm demeanor.   Laboratory and Imaging Data:  Assessment and Plan:   Essential hypertension  Need for prophylactic vaccination and inoculation against influenza - Plan: Flu Vaccine QUAD 36+ mos IM  Hyperlipidemia - Plan: Hepatic function panel, Lipid panel  Special screening examination for neoplasm of prostate - Plan: PSA  Screening for diabetes mellitus - Plan: Basic metabolic panel  Other fatigue - Plan: CBC with Differential  Exposure to communicable disease - Plan: HIV antibody, RPR, GC/chlamydia probe amp, urine  Refill medications. Check basic laboratories, and after reviewing with the patient, given potential risk, we will check him for STDs as well.  Follow-up: No Follow-up on file.  New Prescriptions   No medications on file   Orders Placed This Encounter  Procedures  . Flu Vaccine QUAD 36+ mos IM  . Basic metabolic panel  . CBC with Differential  . Hepatic function panel  . Lipid panel  . PSA  . HIV antibody  . RPR  . GC/chlamydia probe amp, urine    Signed,  Sawyer Mentzer T. Laddie Math, MD   Patient's Medications  New Prescriptions   No medications on file  Previous Medications   ASPIRIN 325 MG  EC TABLET    Take 650 mg by mouth every 6 (six) hours as needed.   FAMOTIDINE (PEPCID) 10 MG TABLET    Take 10 mg by mouth 2 (two) times daily as needed. indigestion   FLUTICASONE (FLONASE) 50 MCG/ACT NASAL SPRAY    USE TWO SPRAYS IN EACH NOSTRIL ONCE DAILY AS NEEDED   IBUPROFEN (ADVIL,MOTRIN) 200 MG TABLET    Take 400 mg by mouth every 6 (six) hours as needed.  Modified Medications   Modified Medication Previous Medication   SIMVASTATIN (ZOCOR) 40 MG TABLET simvastatin (ZOCOR) 40 MG tablet      TAKE ONE TABLET BY MOUTH AT  BEDTIME    TAKE ONE TABLET BY MOUTH AT BEDTIME  Discontinued Medications   LISINOPRIL-HYDROCHLOROTHIAZIDE (PRINZIDE,ZESTORETIC) 10-12.5 MG PER TABLET    Take 1 tablet by mouth daily.   VENLAFAXINE XR (EFFEXOR-XR) 37.5 MG 24 HR CAPSULE    TAKE ONE CAPSULE BY MOUTH ONCE DAILY

## 2014-05-01 ENCOUNTER — Encounter: Payer: Self-pay | Admitting: *Deleted

## 2014-05-01 ENCOUNTER — Telehealth: Payer: Self-pay | Admitting: Family Medicine

## 2014-05-01 LAB — HIV ANTIBODY (ROUTINE TESTING W REFLEX): HIV: NONREACTIVE

## 2014-05-01 LAB — GC/CHLAMYDIA PROBE AMP, URINE
Chlamydia, Swab/Urine, PCR: NEGATIVE
GC PROBE AMP, URINE: NEGATIVE

## 2014-05-01 LAB — RPR

## 2014-05-01 LAB — PSA: PSA: 0.49 ng/mL (ref 0.10–4.00)

## 2014-05-01 NOTE — Telephone Encounter (Signed)
emmi emailed °

## 2014-05-07 ENCOUNTER — Telehealth: Payer: Self-pay | Admitting: Family Medicine

## 2014-05-07 ENCOUNTER — Encounter: Payer: Self-pay | Admitting: Family Medicine

## 2014-05-07 NOTE — Telephone Encounter (Signed)
Surgical Clearance Letter faxed to Dr. Leanor Kail at [phone].

## 2014-05-07 NOTE — Telephone Encounter (Signed)
Dr.Harold Kernodle's office called and said patient has a left quad tendon rupture complete.  Dr.Kernodle would like to do surgery by 05/15/14.  Patient needs clearance for surgery.  Dr.Kernodle would like to know if he needs an appointment to get clearance or if you would do the clearance. If you do the clearance for surgery they need the clearance in writing.  Fax [phone].

## 2014-05-07 NOTE — Telephone Encounter (Signed)
I just wrote a letter for clearance. Can you please FAX to Dr. Scharlene Gloss office.  Thanks.

## 2014-05-15 ENCOUNTER — Ambulatory Visit: Payer: Self-pay | Admitting: Unknown Physician Specialty

## 2014-06-11 DIAGNOSIS — S76112D Strain of left quadriceps muscle, fascia and tendon, subsequent encounter: Secondary | ICD-10-CM | POA: Insufficient documentation

## 2014-09-03 ENCOUNTER — Telehealth: Payer: Self-pay

## 2014-09-03 NOTE — Telephone Encounter (Signed)
Pt left v/m requesting cb about shingles vaccine. Left v/m requesting pt to cb to see if pt had checked with ins co to see if pt should get at local pharmacy or at [facility],

## 2014-09-10 ENCOUNTER — Ambulatory Visit (INDEPENDENT_AMBULATORY_CARE_PROVIDER_SITE_OTHER): Payer: No Typology Code available for payment source

## 2014-09-10 DIAGNOSIS — Z23 Encounter for immunization: Secondary | ICD-10-CM

## 2014-09-18 NOTE — Telephone Encounter (Signed)
Left v/m for pt to ck with ins co if pt has coverage at doctors office or pharmacy; requested pt to cb after gets this info.

## 2014-12-30 ENCOUNTER — Encounter: Payer: Self-pay | Admitting: Family Medicine

## 2014-12-30 ENCOUNTER — Ambulatory Visit (INDEPENDENT_AMBULATORY_CARE_PROVIDER_SITE_OTHER): Payer: No Typology Code available for payment source | Admitting: Family Medicine

## 2014-12-30 VITALS — BP 130/90 | HR 75 | Temp 97.6°F | Ht 70.5 in | Wt 234.5 lb

## 2014-12-30 DIAGNOSIS — Z0000 Encounter for general adult medical examination without abnormal findings: Secondary | ICD-10-CM

## 2014-12-30 MED ORDER — SIMVASTATIN 40 MG PO TABS: ORAL_TABLET | ORAL | Status: DC

## 2014-12-30 NOTE — Progress Notes (Signed)
Pre visit review using our clinic review tool, if applicable. No additional management support is needed unless otherwise documented below in the visit note. 

## 2014-12-30 NOTE — Progress Notes (Signed)
Dr. Frederico Hamman T. Miquel Lamson, MD, [facility] [place], [address] Phone: [phone] Fax: [phone]  12/30/2014  Patient: Timothy Wang, MRN: [id number], DOB: 17-Apr-1951, 64 y.o.  Primary Physician:  Owens Loffler, MD  Chief Complaint: Annual Exam  Subjective:   Timothy Wang is a 64 y.o. pleasant patient who presents with the following:  Preventative Health Maintenance Visit:  Health Maintenance Summary Reviewed and updated, unless pt declines services.  Tobacco History Reviewed. Alcohol: No concerns, no excessive use Exercise Habits: no activity, rec at least 30 mins 5 times a week STD concerns: no risk or activity to increase risk Drug Use: None Encouraged self-testicular check  Effexor - lethargy? Felt more active not taking it.   Was going to PT for his knee, but now has not joined a gym or anything.   Also has a ventral hernia.   No tob Not much ETOH   Health Maintenance  Topic Date Due  . INFLUENZA VACCINE  03/01/2015  . COLONOSCOPY  05/31/2022  . TETANUS/TDAP  02/18/2023  . ZOSTAVAX  Completed  . HIV Screening  Completed   Immunization History  Administered Date(s) Administered  . Influenza Whole 04/10/2011  . Influenza,inj,Quad PF,36+ Mos 04/30/2014  . Tdap 02/17/2013  . Zoster 09/10/2014   Patient Active Problem List   Diagnosis Date Noted  . Essential hypertension 08/04/2010  . Hyperlipidemia 05/28/2008  . HERNIA, VENTRAL 05/28/2008  . FATIGUE 05/28/2008  . DEPRESSION 04/21/2008  . ALLERGIC RHINITIS 04/21/2008   Past Medical History  Diagnosis Date  . Allergic rhinitis due to pollen   . Depression   . H/O: Bell's palsy   . Hyperlipidemia   . Ventral hernia    Past Surgical History  Procedure Laterality Date  . Tonsillectomy  1960  . Heel spur surgery  1987   History   Social History  . Marital Status: Single    Spouse Name: N/A  . Number of Children: N/A  . Years of  Education: N/A   Occupational History  . sales    Social History Main Topics  . Smoking status: Former Research scientist (life sciences)  . Smokeless tobacco: Never Used     Comment: quit 2004  . Alcohol Use: 2.4 oz/week    4 Cans of beer per week  . Drug Use: No  . Sexual Activity: Not on file   Other Topics Concern  . Not on file   Social History Narrative   Family History  Problem Relation Age of Onset  . Alcohol abuse Father   . Heart attack Father   . Thyroid disease Sister   . Depression Sister   . Cancer Sister     breast  . Pancreatic cancer Mother    No Known Allergies  Medication list has been reviewed and updated.   General: Denies fever, chills, sweats. No significant weight loss. Eyes: Denies blurring,significant itching ENT: Denies earache, sore throat, and hoarseness. Cardiovascular: Denies chest pains, palpitations, dyspnea on exertion Respiratory: Denies cough, dyspnea at rest,wheeezing Breast: no concerns about lumps GI: Denies nausea, vomiting, diarrhea, constipation, change in bowel habits, abdominal pain, melena, hematochezia GU: Denies penile discharge, ED, urinary flow / outflow problems. No STD concerns. Musculoskeletal: Denies back pain, joint pain Derm: Denies rash, itching Neuro: Denies  paresthesias, frequent falls, frequent headaches Psych: Denies depression, anxiety Endocrine: Denies cold intolerance, heat intolerance, polydipsia Heme: Denies enlarged lymph nodes Allergy: No hayfever  Objective:   BP 130/90 mmHg  Pulse  75  Temp(Src) 97.6 F (36.4 C) (Oral)  Ht 5' 10.5" (1.791 m)  Wt 234 lb 8 oz (106.369 kg)  BMI 33.16 kg/m2 Ideal Body Weight: Weight in (lb) to have BMI = 25: 176.4  No exam data present  GEN: well developed, well nourished, no acute distress Eyes: conjunctiva and lids normal, PERRLA, EOMI ENT: TM clear, nares clear, oral exam WNL Neck: supple, no lymphadenopathy, no thyromegaly, no JVD Pulm: clear to auscultation and percussion,  respiratory effort normal CV: regular rate and rhythm, S1-S2, no murmur, rub or gallop, no bruits, peripheral pulses normal and symmetric, no cyanosis, clubbing, edema or varicosities GI: soft, non-tender; no hepatosplenomegaly, masses; active bowel sounds all quadrants. Ventral hernia. GU: no hernia, testicular mass, penile discharge Lymph: no cervical, axillary or inguinal adenopathy MSK: gait normal, muscle tone and strength WNL, no joint swelling, effusions, discoloration, crepitus  SKIN: clear, good turgor, color WNL, no rashes, lesions, or ulcerations Neuro: normal mental status, normal strength, sensation, and motion Psych: alert; oriented to person, place and time, normally interactive and not anxious or depressed in appearance.  All labs reviewed with patient.  Lipids:    Component Value Date/Time   CHOL 226* 04/30/2014 1205   TRIG 83.0 04/30/2014 1205   HDL 45.10 04/30/2014 1205   LDLDIRECT 172.7 04/20/2010 1006   VLDL 16.6 04/30/2014 1205   CHOLHDL 5 04/30/2014 1205   CBC: CBC Latest Ref Rng 04/30/2014 02/13/2013 04/20/2010  WBC 4.0 - 10.5 K/uL 5.6 4.5 5.7  Hemoglobin 13.0 - 17.0 g/dL 15.1 15.5 15.5  Hematocrit 39.0 - 52.0 % 45.6 46.0 46.3  Platelets 150.0 - 400.0 K/uL 281.0 240.0 656.8    Basic Metabolic Panel:    Component Value Date/Time   NA 137 04/30/2014 1205   K 4.6 04/30/2014 1205   CL 103 04/30/2014 1205   CO2 33* 04/30/2014 1205   BUN 17 04/30/2014 1205   CREATININE 0.9 04/30/2014 1205   GLUCOSE 81 04/30/2014 1205   CALCIUM 9.2 04/30/2014 1205   Hepatic Function Latest Ref Rng 04/30/2014 02/13/2013 08/04/2010  Total Protein 6.0 - 8.3 g/dL 7.2 6.4 6.5  Albumin 3.5 - 5.2 g/dL 4.1 3.8 3.9  AST 0 - 37 U/L '26 21 28  ' ALT 0 - 53 U/L '29 23 29  ' Alk Phosphatase 39 - 117 U/L 102 97 90  Total Bilirubin 0.2 - 1.2 mg/dL 0.7 0.7 0.7  Bilirubin, Direct 0.0 - 0.3 mg/dL 0.2 - 0.1    No results found for: TSH Lab Results  Component Value Date   PSA 0.49 04/30/2014    PSA 0.55 02/13/2013   PSA 0.69 05/27/2009    Assessment and Plan:   Routine general medical examination at a health care facility  Health Maintenance Exam: The patient's preventative maintenance and recommended screening tests for an annual wellness exam were reviewed in full today. Brought up to date unless services declined.  Counselled on the importance of diet, exercise, and its role in overall health and mortality. The patient's FH and SH was reviewed, including their home life, tobacco status, and drug and alcohol status.  Doing well. Rec. Exercise and weight loss.  Has questions about his ventral hernia, which rarely causes problems. Discussed these - this is purely elective and has a relatively high recurrence rate. He can electively discus with surgery at any point that he would like.  Follow-up: Return in 1 year (on 12/30/2015). Unless noted, follow-up in 1 year for Health Maintenance Exam.  New Prescriptions  No medications on file   No orders of the defined types were placed in this encounter.    Signed,  Maud Deed. Raylei Losurdo, MD   Patient's Medications  New Prescriptions   No medications on file  Previous Medications   ASPIRIN 325 MG EC TABLET    Take 650 mg by mouth every 6 (six) hours as needed.   FAMOTIDINE (PEPCID) 10 MG TABLET    Take 10 mg by mouth 2 (two) times daily as needed. indigestion   FLUTICASONE (FLONASE) 50 MCG/ACT NASAL SPRAY    USE TWO SPRAYS IN EACH NOSTRIL ONCE DAILY AS NEEDED   IBUPROFEN (ADVIL,MOTRIN) 200 MG TABLET    Take 400 mg by mouth every 6 (six) hours as needed.  Modified Medications   Modified Medication Previous Medication   SIMVASTATIN (ZOCOR) 40 MG TABLET simvastatin (ZOCOR) 40 MG tablet      TAKE ONE TABLET BY MOUTH AT BEDTIME    TAKE ONE TABLET BY MOUTH AT BEDTIME  Discontinued Medications   No medications on file

## 2015-01-03 ENCOUNTER — Other Ambulatory Visit: Payer: Self-pay | Admitting: Family Medicine

## 2016-05-24 ENCOUNTER — Telehealth: Payer: Self-pay | Admitting: *Deleted

## 2016-05-24 ENCOUNTER — Encounter: Payer: Self-pay | Admitting: *Deleted

## 2016-05-24 NOTE — Telephone Encounter (Signed)
PT wrote into myChart asking about getting his Hep C Screening done and his flu vaccine. Please let me know when we can schedule him.

## 2016-05-24 NOTE — Telephone Encounter (Signed)
Typically, you should be able to set him up with a flu / nurse visit correct? Ok to schedule.  Typically we can get Hep C at next blood draw unless specific concern or recent risk. Like IV drug use, etc. Generally done at physical or routine appt

## 2016-05-26 ENCOUNTER — Encounter: Payer: Self-pay | Admitting: Family Medicine

## 2016-05-26 ENCOUNTER — Ambulatory Visit (INDEPENDENT_AMBULATORY_CARE_PROVIDER_SITE_OTHER): Payer: Managed Care, Other (non HMO) | Admitting: Family Medicine

## 2016-05-26 ENCOUNTER — Encounter: Payer: No Typology Code available for payment source | Admitting: Family Medicine

## 2016-05-26 VITALS — BP 120/96 | HR 88 | Ht 69.75 in | Wt 236.0 lb

## 2016-05-26 DIAGNOSIS — Z23 Encounter for immunization: Secondary | ICD-10-CM | POA: Diagnosis not present

## 2016-05-26 DIAGNOSIS — Z6834 Body mass index (BMI) 34.0-34.9, adult: Secondary | ICD-10-CM | POA: Diagnosis not present

## 2016-05-26 DIAGNOSIS — Z0000 Encounter for general adult medical examination without abnormal findings: Secondary | ICD-10-CM | POA: Diagnosis not present

## 2016-05-26 DIAGNOSIS — R4584 Anhedonia: Secondary | ICD-10-CM | POA: Diagnosis not present

## 2016-05-26 DIAGNOSIS — Z1159 Encounter for screening for other viral diseases: Secondary | ICD-10-CM

## 2016-05-26 DIAGNOSIS — K439 Ventral hernia without obstruction or gangrene: Secondary | ICD-10-CM

## 2016-05-26 MED ORDER — VENLAFAXINE HCL ER 37.5 MG PO CP24: 37.5000 mg | ORAL_CAPSULE | Freq: Every day | ORAL | 5 refills | Status: DC

## 2016-05-26 NOTE — Patient Instructions (Addendum)
Try yoga and a little daily exercise  Keeping you healthy  Get these tests  Blood pressure- Have your blood pressure checked once a year by your healthcare provider.  Normal blood pressure is 120/80  Weight- Have your body mass index (BMI) calculated to screen for obesity.  BMI is a measure of body fat based on height and weight. You can also calculate your own BMI at ViewBanking.si.  Cholesterol- Have your cholesterol checked every year.  Diabetes- Have your blood sugar checked regularly if you have high blood pressure, high cholesterol, have a family history of diabetes or if you are overweight.  Screening for Colon Cancer- Colonoscopy starting at age 72.  Screening may begin sooner depending on your family history and other health conditions. Follow up colonoscopy as directed by your Gastroenterologist.  Screening for Prostate Cancer- Both blood work (PSA) and a rectal exam help screen for Prostate Cancer.  Screening begins at age 2 with African-American men and at age 36 with Caucasian men.  Screening may begin sooner depending on your family history.  Take these medicines  Aspirin- One aspirin daily can help prevent Heart disease and Stroke.  Flu shot- Every fall.  Tetanus- Every 10 years.  Zostavax- Once after the age of 65 to prevent Shingles.  Pneumonia shot- Once after the age of 45; if you are younger than 67, ask your healthcare provider if you need a Pneumonia shot.  Take these steps  Don't smoke- If you do smoke, talk to your doctor about quitting.  For tips on how to quit, go to www.smokefree.gov or call 1-800-QUIT-NOW.  Be physically active- Exercise 5 days a week for at least 30 minutes.  If you are not already physically active start slow and gradually work up to 30 minutes of moderate physical activity.  Examples of moderate activity include walking briskly, mowing the yard, dancing, swimming, bicycling, etc.  Eat a healthy diet- Eat a variety of  healthy food such as fruits, vegetables, low fat milk, low fat cheese, yogurt, lean meant, poultry, fish, beans, tofu, etc. For more information go to www.thenutritionsource.org  Drink alcohol in moderation- Limit alcohol intake to less than two drinks a day. Never drink and drive.  Dentist- Brush and floss twice daily; visit your dentist twice a year.  Depression- Your emotional health is as important as your physical health. If you're feeling down, or losing interest in things you would normally enjoy please talk to your healthcare provider.  Eye exam- Visit your eye doctor every year.  Safe sex- If you may be exposed to a sexually transmitted infection, use a condom.  Seat belts- Seat belts can save your life; always wear one.  Smoke/Carbon Monoxide detectors- These detectors need to be installed on the appropriate level of your home.  Replace batteries at least once a year.  Skin cancer- When out in the sun, cover up and use sunscreen 15 SPF or higher.  Violence- If anyone is threatening you, please tell your healthcare provider.  Living Will/ Health care power of attorney- Speak with your healthcare provider and family.

## 2016-05-26 NOTE — Progress Notes (Signed)
Subjective:    Patient ID: Timothy Wang, male    DOB: [birth year], 65 y.o.   MRN: [id number]  HPI This is a 65 yo male who presents today for CPE. He requests referral to Dr. Adonis Huguenin for known ventral hernia that is getting larger.  Was previously on Effexor at a low dose and felt it helped with his mood. He has had a lack of interest in things recently and is not experiencing "highs and lows." He is single, has no living family in the area. Gets together with friends for dinner occasionally. Does not participate in any activities or social groups. Denies SI. He works at a [employer] and enjoys his work. Has been able to maintain his sense of humor. Sleeps ok.   Last CPE- several years PSA- 2015 Colonoscopy- 11/13, ten year recall Tdap-02/17/13 Flu- annual Dental- regular Eye- annual Exercise- not regular  Stopped taking simvistatin at least one year ago  Past Medical History:  Diagnosis Date  . Allergic rhinitis due to pollen   . Depression   . H/O: Bell's palsy   . Hyperlipidemia   . Ventral hernia    Past Surgical History:  Procedure Laterality Date  . [place]  . TONSILLECTOMY  1960   Family History  Problem Relation Age of Onset  . Alcohol abuse Father   . Heart attack Father   . Thyroid disease Sister   . Depression Sister   . Cancer Sister     breast  . Pancreatic cancer Mother      Review of Systems  Constitutional: Negative.   HENT: Positive for postnasal drip (year round, uses flonase).   Eyes: Negative.   Respiratory: Positive for shortness of breath (with exertion).   Cardiovascular: Positive for palpitations (occasionally with stress). Negative for chest pain and leg swelling.  Gastrointestinal: Negative.   Endocrine: Negative.   Genitourinary: Negative.   Musculoskeletal: Positive for arthralgias (left shoulder occasionally).  Allergic/Immunologic: Negative.   Psychiatric/Behavioral: Positive for dysphoric mood.         Objective:   Physical Exam  Constitutional: He is oriented to person, place, and time. He appears well-developed and well-nourished. No distress.  Obese.   HENT:  Head: Normocephalic and atraumatic.  Right Ear: External ear normal.  Left Ear: External ear normal.  Nose: Nose normal.  Mouth/Throat: Oropharynx is clear and moist.  Eyes: Conjunctivae are normal.  Neck: Normal range of motion. Neck supple.  Cardiovascular: Normal rate, regular rhythm and normal heart sounds.   Pulmonary/Chest: Effort normal and breath sounds normal.  Abdominal: Soft. Bowel sounds are normal. A hernia is present. Hernia confirmed positive in the ventral area (large, prononced with abdominal flexion.).  Musculoskeletal: Normal range of motion. He exhibits no edema.  Lymphadenopathy:    He has no cervical adenopathy.  Neurological: He is alert and oriented to person, place, and time. He has normal reflexes.  Skin: Skin is warm and dry. He is not diaphoretic.  Psychiatric: He has a normal mood and affect. His behavior is normal. Judgment and thought content normal.  Vitals reviewed.     BP (!) 120/96   Pulse 88   Ht 5' 9.75" (1.772 m)   Wt 236 lb (107 kg)   SpO2 94%   BMI 34.11 kg/m  Wt Readings from Last 3 Encounters:  05/26/16 236 lb (107 kg)  12/30/14 234 lb 8 oz (106.4 kg)  04/30/14 237 lb 4 oz (107.6 kg)  Assessment & Plan:  1. Routine general medical examination at a health care facility - Lipid panel; Future - Hemoglobin A1c; Future - Comprehensive metabolic panel; Future - TSH; Future - CBC with Differential/Platelet; Future  2. Anhedonia - venlafaxine XR (EFFEXOR-XR) 37.5 MG 24 hr capsule; Take 1 capsule (37.5 mg total) by mouth daily with breakfast.  Dispense: 30 capsule; Refill: 5 - encouraged increased exercise, increased social activities - Follow up with Dr. Lorelei Pont in 4-6 months, sooner if worsening symptoms  3. Need for hepatitis C screening test - Hepatitis C  antibody; Future  4. BMI 34.0-34.9,adult - encouraged smaller portions, increase vegetable intake  5. Ventral hernia without obstruction or gangrene - Ambulatory referral to [place], FNP-BC  [facility], [place]  05/29/2016 3:37 PM

## 2016-05-29 ENCOUNTER — Other Ambulatory Visit (INDEPENDENT_AMBULATORY_CARE_PROVIDER_SITE_OTHER): Payer: Managed Care, Other (non HMO)

## 2016-05-29 DIAGNOSIS — Z0000 Encounter for general adult medical examination without abnormal findings: Secondary | ICD-10-CM | POA: Diagnosis not present

## 2016-05-29 DIAGNOSIS — Z1159 Encounter for screening for other viral diseases: Secondary | ICD-10-CM

## 2016-05-29 LAB — LIPID PANEL
CHOL/HDL RATIO: 5
Cholesterol: 213 mg/dL — ABNORMAL HIGH (ref 0–200)
HDL: 44.6 mg/dL (ref >39.00)
LDL CALC: 154 mg/dL — AB (ref 0–99)
NonHDL: 167.97
TRIGLYCERIDES: 69 mg/dL (ref 0.0–149.0)
VLDL: 13.8 mg/dL (ref 0.0–40.0)

## 2016-05-29 LAB — COMPREHENSIVE METABOLIC PANEL
ALBUMIN: 4.3 g/dL (ref 3.5–5.2)
ALK PHOS: 106 U/L (ref 39–117)
ALT: 17 U/L (ref 0–53)
AST: 18 U/L (ref 0–37)
BUN: 16 mg/dL (ref 6–23)
CALCIUM: 9.7 mg/dL (ref 8.4–10.5)
CHLORIDE: 102 meq/L (ref 96–112)
CO2: 31 mEq/L (ref 19–32)
CREATININE: 0.98 mg/dL (ref 0.40–1.50)
GFR: 81.68 mL/min (ref >60.00)
Glucose, Bld: 94 mg/dL (ref 70–99)
POTASSIUM: 4.4 meq/L (ref 3.5–5.1)
Sodium: 140 mEq/L (ref 135–145)
Total Bilirubin: 0.6 mg/dL (ref 0.2–1.2)
Total Protein: 7.3 g/dL (ref 6.0–8.3)

## 2016-05-29 LAB — CBC WITH DIFFERENTIAL/PLATELET
BASOS ABS: 0 10*3/uL (ref 0.0–0.1)
Basophils Relative: 0.6 % (ref 0.0–3.0)
EOS PCT: 0.8 % (ref 0.0–5.0)
Eosinophils Absolute: 0 10*3/uL (ref 0.0–0.7)
HCT: 47.8 % (ref 39.0–52.0)
HEMOGLOBIN: 16.2 g/dL (ref 13.0–17.0)
Lymphocytes Relative: 18.9 % (ref 12.0–46.0)
Lymphs Abs: 1 10*3/uL (ref 0.7–4.0)
MCHC: 33.8 g/dL (ref 30.0–36.0)
MCV: 89.5 fl (ref 78.0–100.0)
MONO ABS: 0.5 10*3/uL (ref 0.1–1.0)
Monocytes Relative: 9.5 % (ref 3.0–12.0)
Neutro Abs: 3.5 10*3/uL (ref 1.4–7.7)
Neutrophils Relative %: 70.2 % (ref 43.0–77.0)
Platelets: 276 10*3/uL (ref 150.0–400.0)
RBC: 5.34 Mil/uL (ref 4.22–5.81)
RDW: 13.7 % (ref 11.5–15.5)
WBC: 5 10*3/uL (ref 4.0–10.5)

## 2016-05-29 LAB — TSH: TSH: 2.84 u[IU]/mL (ref 0.35–4.50)

## 2016-05-29 LAB — HEMOGLOBIN A1C: Hgb A1c MFr Bld: 5.8 % (ref 4.6–6.5)

## 2016-05-30 LAB — HEPATITIS C ANTIBODY: HCV Ab: NEGATIVE

## 2016-05-31 ENCOUNTER — Telehealth: Payer: Self-pay | Admitting: General Surgery

## 2016-05-31 NOTE — Telephone Encounter (Signed)
I have called patient to make an appointment per referral for a ventral hernia repair. No answer. I have left a message for patient to call back on voicemail.

## 2016-06-01 ENCOUNTER — Encounter: Payer: Self-pay | Admitting: Family Medicine

## 2016-06-01 NOTE — Telephone Encounter (Signed)
Appointment has been made

## 2016-06-02 ENCOUNTER — Telehealth: Payer: Self-pay | Admitting: *Deleted

## 2016-06-02 NOTE — Telephone Encounter (Signed)
[name],  Do you remember giving Mr. Ulin his flu shot when he saw Tor Netters on 05/26/2016.  If so you need to document that you gave it.

## 2016-06-02 NOTE — Telephone Encounter (Signed)
PT wrote in stating he had his flu vaccine at his most recent visit with Tor Netters and would like it to be updated in his chart.

## 2016-06-05 DIAGNOSIS — Z23 Encounter for immunization: Secondary | ICD-10-CM

## 2016-06-05 NOTE — Telephone Encounter (Signed)
YES, he did receive one. I have documented it.

## 2016-07-04 ENCOUNTER — Ambulatory Visit (INDEPENDENT_AMBULATORY_CARE_PROVIDER_SITE_OTHER): Payer: Managed Care, Other (non HMO) | Admitting: General Surgery

## 2016-07-04 ENCOUNTER — Encounter: Payer: Self-pay | Admitting: General Surgery

## 2016-07-04 VITALS — BP 155/95 | HR 81 | Temp 98.7°F | Wt 235.0 lb

## 2016-07-04 DIAGNOSIS — K439 Ventral hernia without obstruction or gangrene: Secondary | ICD-10-CM

## 2016-07-04 NOTE — Patient Instructions (Signed)
Please give [place] a call if you have any questions in reference to your hernia.   Ventral Hernia A ventral hernia is a bulge of tissue from inside the abdomen that pushes through a weak area of the muscles that form the front wall of the abdomen. The tissues inside the abdomen are inside a sac (peritoneum). These tissues include the small intestine, large intestine, and the fatty tissue that covers the intestines (omentum). Sometimes, the bulge that forms a hernia contains intestines. Other hernias contain only fat. Ventral hernias do not go away without surgical treatment. There are several types of ventral hernias. You may have:  A hernia at an incision site from previous abdominal surgery (incisional hernia).  A hernia just above the belly button (epigastric hernia), or at the belly button (umbilical hernia). These types of hernias can develop from heavy lifting or straining.  A hernia that comes and goes (reducible hernia). It may be visible only when you lift or strain. This type of hernia can be pushed back into the abdomen (reduced).  A hernia that traps abdominal tissue inside the hernia (incarcerated hernia). This type of hernia does not reduce.  A hernia that cuts off blood flow to the tissues inside the hernia (strangulated hernia). The tissues can start to die if this happens. This is a very painful bulge that cannot be reduced. A strangulated hernia is a medical emergency. What are the causes? This condition is caused by abdominal tissue putting pressure on an area of weakness in the abdominal muscles. What increases the risk? The following factors may make you more likely to develop this condition:  Being male.  Being 74 or older.  Being overweight or obese.  Having had previous abdominal surgery, especially if there was an infection after surgery.  Having had an injury to the abdominal wall.  Having had several pregnancies.  Having a buildup of fluid inside the abdomen  (ascites). What are the signs or symptoms? The only symptom of a ventral hernia may be a painless bulge in the abdomen. A reducible hernia may be visible only when you strain, cough, or lift. Other symptoms may include:  Dull pain.  A feeling of pressure. Signs and symptoms of a strangulated hernia may include:  Increasing pain.  Nausea and vomiting.  Pain when pressing on the hernia.  The skin over the hernia turning red or purple.  Constipation.  Blood in the stool (feces). How is this diagnosed? This condition may be diagnosed based on:  Your symptoms.  Your medical history.  A physical exam. You may be asked to cough or strain while standing. These actions increase the pressure inside your abdomen and force the hernia through the opening in your muscles. Your health care provider may try to reduce the hernia by pressing on it.  Imaging studies, such as an ultrasound or CT scan. How is this treated? This condition is treated with surgery. If you have a strangulated hernia, surgery is done as soon as possible. If your hernia is small and not incarcerated, you may be asked to lose some weight before surgery. Follow these instructions at home:  Follow instructions from your health care provider about eating or drinking restrictions.  If you are overweight, your health care provider may recommend that you increase your activity level and eat a healthier diet.  Do not lift anything that is heavier than 10 lb (4.5 kg).  Return to your normal activities as told by your health care provider. Ask  your health care provider what activities are safe for you. You may need to avoid activities that increase pressure on your hernia.  Take over-the-counter and prescription medicines only as told by your health care provider.  Keep all follow-up visits as told by your health care provider. This is important. Contact a health care provider if:  Your hernia gets larger.  Your hernia  becomes painful. Get help right away if:  Your hernia becomes increasingly painful.  You have pain along with any of the following:  Changes in skin color in the area of the hernia.  Nausea.  Vomiting.  Fever. Summary  A ventral hernia is a bulge of tissue from inside the abdomen that pushes through a weak area of the muscles that form the front wall of the abdomen.  This condition is treated with surgery, which may be urgent depending on your hernia.  Do not lift anything that is heavier than 10 lb (4.5 kg), and follow activity instructions from your health care provider. This information is not intended to replace advice given to you by your health care provider. Make sure you discuss any questions you have with your health care provider. Document Released: 07/03/2012 Document Revised: 03/03/2016 Document Reviewed: 03/03/2016 Elsevier Interactive Patient Education  2017 [employer].

## 2016-07-04 NOTE — Progress Notes (Signed)
Patient ID: Timothy Wang, male   DOB: 1951/06/30, 65 y.o.   MRN: [id number]  CC: Hernia  HPI Timothy Wang is a 65 y.o. male presents clinic today for evaluation of a ventral hernia. Patient states he's been there for at least 10 years but thinks that over the last year it has gradually increased in size. It bulges out throughout the day but will reduce on its own when he lays down at night. It is mostly bothering him in terms of cosmesis. Is currently not tender and does not cause any constant pain. He denies any fevers, chills, nausea, vomiting, chest pain, shortness breath, diarrhea, constipation. He has never had any abdominal surgeries but states that "it's time for me to deal with this ".  HPI  Past Medical History:  Diagnosis Date  . Allergic rhinitis due to pollen   . Depression   . H/O: Bell's palsy   . Hyperlipidemia   . Ventral hernia     Past Surgical History:  Procedure Laterality Date  . [place]  . TONSILLECTOMY  1960    Family History  Problem Relation Age of Onset  . Pancreatic cancer Mother   . Alcohol abuse Father   . Heart attack Father   . Thyroid disease Sister   . Depression Sister   . Cancer Sister     breast    Social History Social History  Substance Use Topics  . Smoking status: Former Research scientist (life sciences)  . Smokeless tobacco: Never Used     Comment: quit 2004  . Alcohol use 2.4 oz/week    4 Cans of beer per week    No Known Allergies  Current Outpatient Prescriptions  Medication Sig Dispense Refill  . aspirin 325 MG EC tablet Take 650 mg by mouth every 6 (six) hours as needed.    . famotidine (PEPCID) 10 MG tablet Take 10 mg by mouth 2 (two) times daily as needed. indigestion    . fluticasone (FLONASE) 50 MCG/ACT nasal spray USE TWO SPRAY(S) IN EACH NOSTRIL ONCE DAILY AS NEEDED 16 g 11  . ibuprofen (ADVIL,MOTRIN) 200 MG tablet Take 400 mg by mouth every 6 (six) hours as needed.    . venlafaxine XR (EFFEXOR-XR) 37.5 MG 24 hr capsule Take  1 capsule (37.5 mg total) by mouth daily with breakfast. 30 capsule 5   No current facility-administered medications for this visit.      Review of Systems A Multi-point review of systems was asked and was negative except for the findings documented in the history of present illness  Physical Exam Blood pressure (!) 155/95, pulse 81, temperature 98.7 F (37.1 C), temperature source Oral, weight 106.6 kg (235 lb). CONSTITUTIONAL: No acute distress. EYES: Pupils are equal, round, and reactive to light, Sclera are non-icteric. EARS, NOSE, MOUTH AND THROAT: The oropharynx is clear. The oral mucosa is pink and moist. Hearing is intact to voice. LYMPH NODES:  Lymph nodes in the neck are normal. RESPIRATORY:  Lungs are clear. There is normal respiratory effort, with equal breath sounds bilaterally, and without pathologic use of accessory muscles. CARDIOVASCULAR: Heart is regular without murmurs, gallops, or rubs. GI: The abdomen is soft, nontender, and nondistended. There is a visible and palpable midline ventral hernia that is easily reducible when the patient lies supine. There is no hepatosplenomegaly. There are normal bowel sounds in all quadrants. GU: Rectal deferred.   MUSCULOSKELETAL: Normal muscle strength and tone. No cyanosis or edema.   SKIN: Turgor is good  and there are no pathologic skin lesions or ulcers. NEUROLOGIC: Motor and sensation is grossly normal. Cranial nerves are grossly intact. PSYCH:  Oriented to person, place and time. Affect is normal.  Data Reviewed No images or labs to review I have personally reviewed the patient's imaging, laboratory findings and medical records.    Assessment    Ventral hernia    Plan    65 year old male with a reducible ventral hernia. Discussed the problem as well as the treatment options of open versus laparoscopic hernia repair in detail. Both procedures were described with the risks, benefits, alternatives and the patient voiced  understanding. At this point the patient states he's unsure which technique he would prefer but is leaning towards an open repair. With the pending holidays he states he will likely need to wait until after the first of the year since making have the appropriate time off from work to undergo the repair. Patient states that when he knows when he can have surgery he will call clinic back to set up that surgery date. At this time we will schedule him a follow-up appointment after the first of the year to do an H&P update as well as formalization of his surgery scheduled.     Time spent with the patient was 45 minutes, with more than 50% of the time spent in face-to-face education, counseling and care coordination.     Clayburn Pert, MD FACS General Surgeon 07/04/2016, 2:34 PM

## 2016-08-07 ENCOUNTER — Ambulatory Visit: Payer: Self-pay | Admitting: General Surgery

## 2016-08-10 ENCOUNTER — Ambulatory Visit (INDEPENDENT_AMBULATORY_CARE_PROVIDER_SITE_OTHER): Payer: Managed Care, Other (non HMO) | Admitting: General Surgery

## 2016-08-10 ENCOUNTER — Encounter: Payer: Self-pay | Admitting: General Surgery

## 2016-08-10 VITALS — BP 151/100 | HR 98 | Temp 98.3°F | Wt 232.0 lb

## 2016-08-10 DIAGNOSIS — K439 Ventral hernia without obstruction or gangrene: Secondary | ICD-10-CM

## 2016-08-10 NOTE — Patient Instructions (Signed)
Please give [place] a call if you have any questions or concerns. In the meantime, go to the emergency room if you notice any redness, nausea, vomiting, fever or chills.

## 2016-08-10 NOTE — Progress Notes (Signed)
Outpatient Surgical Follow Up  08/10/2016  Timothy Wang is an 66 y.o. male.   Chief Complaint  Patient presents with  . Follow-up    Go over H&P for Ventral Hernia    HPI: 66 year old male returns to clinic for follow-up for his ventral hernia. Patient reports that has not changed since his last visit. He continues to be reducible. He denies any fevers, chills, nausea, vomiting, chest pain, shortness of breath, diarrhea, constipation. He mostly only notices his ventral hernia at the end of the day after he has been standing. It is always fully reduced when he awakens in the morning. No other changes in his health since his last visit.  Past Medical History:  Diagnosis Date  . Allergic rhinitis due to pollen   . Depression   . H/O: Bell's palsy   . Hyperlipidemia   . Ventral hernia     Past Surgical History:  Procedure Laterality Date  . [place]  . TONSILLECTOMY  1960    Family History  Problem Relation Age of Onset  . Pancreatic cancer Mother   . Alcohol abuse Father   . Heart attack Father   . Thyroid disease Sister   . Depression Sister   . Cancer Sister     breast    Social History:  reports that he has quit smoking. He has never used smokeless tobacco. He reports that he drinks about 2.4 oz of alcohol per week . He reports that he does not use drugs.  Allergies: No Known Allergies  Medications reviewed.    ROS A multipoint review of systems was completed. All pertinent positives and negatives are documented within the history of present illness and remainder are negative.   BP (!) 151/100   Pulse 98   Temp 98.3 F (36.8 C) (Oral)   Wt 105.2 kg (232 lb)   BMI 33.53 kg/m   Physical Exam Gen.: No acute distress Chest: Clear to auscultation Heart: Regular rhythm Abdomen: Soft, nontender, nondistended. Visible and obvious midline ventral hernia approximately 3 cm cephalad from the umbilicus. It is fully reducible and an approximate 3 cm  fascial defect is able to be identified. Extremities: Moves all extremities well.    No results found for this or any previous visit (from the past 48 hour(s)). No results found.  Assessment/Plan:  1. Ventral hernia without obstruction or gangrene 66 year old male with a reducible ventral hernia. Patient states that now he wants to wait for surgery until after his 66th birthday in 2 months so that he can be on Medicare. I discussed that this is acceptable for long as the hernia remains reducible. I counseled him as to the signs and symptoms of incarceration or strangulation and to report to the emergency department immediately should they occur. Otherwise he'll be tentatively scheduled for surgery for the first Friday in April with follow-up with me 2 weeks prior in clinic. All questions answered to his satisfaction and we will see him back in clinic in 2 months.  Patient also with hypertension in clinic today. He will see his primary care later this month to address his blood pressure.     Clayburn Pert, MD FACS General Surgeon  08/10/2016,3:38 PM

## 2016-08-21 ENCOUNTER — Telehealth: Payer: Self-pay | Admitting: General Surgery

## 2016-08-21 NOTE — Telephone Encounter (Signed)
Pt advised of pre op date/time and sx date. Sx: 11/03/16 with Dr Golden Circle ventral hernia repair with mesh.  Pre op: Telephone. Not scheduled at this time. Will call patient once template is made for that department is made to schedule.   Patient made aware to call [phone], between 1-3:00pm the day before surgery, to find out what time to arrive.     Patient also aware of office appointment with Dr Adonis Huguenin on 10/19/16 @ 3:00pm.

## 2016-08-22 ENCOUNTER — Telehealth: Payer: Self-pay

## 2016-08-22 NOTE — Telephone Encounter (Signed)
No Authorization required for CPT code [id number] per Jess with [employer] on 08/22/16 @ 2:35 pm.

## 2016-10-19 ENCOUNTER — Ambulatory Visit (INDEPENDENT_AMBULATORY_CARE_PROVIDER_SITE_OTHER): Payer: Medicare Other | Admitting: General Surgery

## 2016-10-19 VITALS — BP 180/105 | HR 80 | Temp 97.8°F | Ht 71.0 in | Wt 233.0 lb

## 2016-10-19 DIAGNOSIS — K439 Ventral hernia without obstruction or gangrene: Secondary | ICD-10-CM

## 2016-10-19 NOTE — Progress Notes (Signed)
Outpatient Surgical Follow Up  10/19/2016  Timothy Wang is an 66 y.o. male.   Chief Complaint  Patient presents with  . Follow-up    Ventral hernia (H&P)    HPI: 66 year old male returns to clinic today for an update of his H&P prior to a ventral hernia repair. As previously reported its been there for at least 10 years and they thinks it is increased in size since his last visit. Otherwise he denies any new changes. He continues to bulge out through the day and reduces on its own when he lays back. His primary concern continues to be cosmesis. It is not tender and does not cause any pain. He denies any fevers, chills, nausea, vomiting, chest pain, shortness of breath, diarrhea, constipation.  Past Medical History:  Diagnosis Date  . Allergic rhinitis due to pollen   . Depression   . H/O: Bell's palsy   . Hyperlipidemia   . Ventral hernia     Past Surgical History:  Procedure Laterality Date  . [place]  . TONSILLECTOMY  1960    Family History  Problem Relation Age of Onset  . Pancreatic cancer Mother   . Alcohol abuse Father   . Heart attack Father   . Thyroid disease Sister   . Depression Sister   . Cancer Sister     breast    Social History:  reports that he has quit smoking. He has never used smokeless tobacco. He reports that he drinks about 2.4 oz of alcohol per week . He reports that he does not use drugs.  Allergies: No Known Allergies  Medications reviewed.    ROS A multipoint review of systems was completed, all pertinent positives and negatives are documented through the history of present illness and remainder are negative.   BP (!) 180/105   Pulse 80   Temp 97.8 F (36.6 C) (Oral)   Ht 5\' 11"  (1.803 m)   Wt 105.7 kg (233 lb)   BMI 32.50 kg/m   Physical Exam CONSTITUTIONAL: No acute distress. EYES: Pupils are equal, round, and reactive to light, Sclera are non-icteric. EARS, NOSE, MOUTH AND THROAT: The oropharynx is clear. The  oral mucosa is pink and moist. Hearing is intact to voice. LYMPH NODES:  Lymph nodes in the neck are normal. RESPIRATORY:  Lungs are clear. There is normal respiratory effort, with equal breath sounds bilaterally, and without pathologic use of accessory muscles. CARDIOVASCULAR: Heart is regular without murmurs, gallops, or rubs. GI: The abdomen is soft, nontender, and nondistended. There is a visible and palpable midline ventral hernia that is easily reducible when the patient lies supine. There is no hepatosplenomegaly. There are normal bowel sounds in all quadrants. GU: Rectal deferred.   MUSCULOSKELETAL: Normal muscle strength and tone. No cyanosis or edema.   SKIN: Turgor is good and there are no pathologic skin lesions or ulcers. NEUROLOGIC: Motor and sensation is grossly normal. Cranial nerves are grossly intact. PSYCH:  Oriented to person, place and time. Affect is normal.    No results found for this or any previous visit (from the past 48 hour(s)). No results found.  Assessment/Plan:  1. Ventral hernia without obstruction or gangrene 66 year old male with reducible ventral hernia. Again described the treatment options of open versus laparoscopic in detail he decides to proceed with an open repair. He is currently scheduled for surgery on April 6. He was noted today to have elevated blood pressure. He has been elevated in last 3  times in clinic. He will be seeing his primary care provider prior to proceeding with surgery. Tentatively plan to continue with her current schedule however should his blood pressure continued be an issue he understands that his surgery may be delayed. All questions answered to his satisfaction.  A total of 25 minutes was spent on this encounter. 50% of a user counseling and coordination of care.   Clayburn Pert, MD FACS General Surgeon  10/19/2016,3:12 PM

## 2016-10-19 NOTE — Patient Instructions (Addendum)
Please look at your blue sheet in case you have any questions or concerns about your surgery. Your surgery is scheduled to be on 11/03/2016.  Your appointment with Dr. Edilia Bo will be on 10/23/2016 at 12:00 PM.

## 2016-10-23 ENCOUNTER — Encounter: Payer: Self-pay | Admitting: Family Medicine

## 2016-10-23 ENCOUNTER — Ambulatory Visit (INDEPENDENT_AMBULATORY_CARE_PROVIDER_SITE_OTHER): Payer: 59 | Admitting: Family Medicine

## 2016-10-23 VITALS — BP 126/98 | HR 75 | Temp 98.2°F | Ht 71.0 in | Wt 233.2 lb

## 2016-10-23 DIAGNOSIS — I10 Essential (primary) hypertension: Secondary | ICD-10-CM | POA: Diagnosis not present

## 2016-10-23 DIAGNOSIS — K439 Ventral hernia without obstruction or gangrene: Secondary | ICD-10-CM | POA: Diagnosis not present

## 2016-10-23 MED ORDER — LISINOPRIL-HYDROCHLOROTHIAZIDE 10-12.5 MG PO TABS: 1.0000 | ORAL_TABLET | Freq: Every day | ORAL | 1 refills | Status: DC

## 2016-10-23 NOTE — Progress Notes (Signed)
Dr. Frederico Hamman T. Hanley Rispoli, MD, [facility] [place], [address] Phone: [phone] Fax: [phone]  10/23/2016  Patient: Timothy Wang, MRN: [id number], DOB: 1951/07/27, 66 y.o.  Primary Physician:  Owens Loffler, MD   Chief Complaint  Patient presents with  . Surgical Clearance   Subjective:   Timothy Wang is a 66 y.o. very pleasant male patient who presents with the following:  Consultation from Dr. Adonis Huguenin for surgical clearance.   150/94 today Much higher in his office last week.  Pattern is persistent elevation over the last year.  No new supplements or drugs. Questionable headache o/w no symptoms.  BP Readings from Last 3 Encounters:  10/23/16 (!) 126/98  10/19/16 (!) 180/105  08/10/16 (!) 151/100    Scheduled for surgery 11/03/2016.  Past Medical History, Surgical History, Social History, Family History, Problem List, Medications, and Allergies have been reviewed and updated if relevant.  Patient Active Problem List   Diagnosis Date Noted  . Tendon rupture, traumatic. quadriceps, left, subsequent encounter 06/11/2014  . Essential hypertension 08/04/2010  . Hyperlipidemia 05/28/2008  . Ventral hernia 05/28/2008  . FATIGUE 05/28/2008  . DEPRESSION 04/21/2008  . ALLERGIC RHINITIS 04/21/2008    Past Medical History:  Diagnosis Date  . Allergic rhinitis due to pollen   . Depression   . H/O: Bell's palsy   . Hyperlipidemia   . Ventral hernia     Past Surgical History:  Procedure Laterality Date  . [place]  . TONSILLECTOMY  1960    Social History   Social History  . Marital status: Single    Spouse name: N/A  . Number of children: N/A  . 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   Social History Main Topics  . Smoking status: Former Research scientist (life sciences)  . Smokeless tobacco: Never Used     Comment: quit 2004  . Alcohol use 2.4 oz/week    4 Cans of beer  per week  . Drug use: No  . Sexual activity: Not on file   Other Topics Concern  . Not on file   Social History Narrative  . No narrative on file    Family History  Problem Relation Age of Onset  . Pancreatic cancer Mother   . Alcohol abuse Father   . Heart attack Father   . Thyroid disease Sister   . Depression Sister   . Cancer Sister     breast    No Known Allergies  Medication list reviewed and updated in full in [place].   GEN: No acute illnesses, no fevers, chills. GI: No n/v/d, eating normally Pulm: No SOB Interactive and getting along well at home.  Otherwise, ROS is as per the HPI.  Objective:   BP (!) 126/98   Pulse 75   Temp 98.2 F (36.8 C) (Oral)   Ht 5\' 11"  (1.803 m)   Wt 233 lb 4 oz (105.8 kg)   BMI 32.53 kg/m   GEN: WDWN, NAD, Non-toxic, A & O x 3 HEENT: Atraumatic, Normocephalic. Neck supple. No masses, No LAD. Ears and Nose: No external deformity. CV: RRR, No M/G/R. No JVD. No thrill. No extra heart sounds. PULM: CTA B, no wheezes, crackles, rhonchi. No retractions. No resp. distress. No accessory muscle use. EXTR: No c/c/e NEURO Normal gait.  PSYCH: Normally interactive. Conversant. Not depressed or anxious appearing.  Calm demeanor.   Laboratory and  Imaging Data: Results for orders placed or performed in visit on 05/29/16  Lipid panel  Result Value Ref Range   Cholesterol 213 (H) 0 - 200 mg/dL   Triglycerides 69.0 0.0 - 149.0 mg/dL   HDL 44.60 >39.00 mg/dL   VLDL 13.8 0.0 - 40.0 mg/dL   LDL Cholesterol 154 (H) 0 - 99 mg/dL   Total CHOL/HDL Ratio 5    NonHDL 167.97   Hemoglobin A1c  Result Value Ref Range   Hgb A1c MFr Bld 5.8 4.6 - 6.5 %  Comprehensive metabolic panel  Result Value Ref Range   Sodium 140 135 - 145 mEq/L   Potassium 4.4 3.5 - 5.1 mEq/L   Chloride 102 96 - 112 mEq/L   CO2 31 19 - 32 mEq/L   Glucose, Bld 94 70 - 99 mg/dL   BUN 16 6 - 23 mg/dL   Creatinine, Ser 0.98 0.40 - 1.50 mg/dL   Total  Bilirubin 0.6 0.2 - 1.2 mg/dL   Alkaline Phosphatase 106 39 - 117 U/L   AST 18 0 - 37 U/L   ALT 17 0 - 53 U/L   Total Protein 7.3 6.0 - 8.3 g/dL   Albumin 4.3 3.5 - 5.2 g/dL   Calcium 9.7 8.4 - 10.5 mg/dL   GFR 81.68 >60.00 mL/min  TSH  Result Value Ref Range   TSH 2.84 0.35 - 4.50 uIU/mL  Hepatitis C antibody  Result Value Ref Range   HCV Ab NEGATIVE NEGATIVE  CBC with Differential/Platelet  Result Value Ref Range   WBC 5.0 4.0 - 10.5 K/uL   RBC 5.34 4.22 - 5.81 Mil/uL   Hemoglobin 16.2 13.0 - 17.0 g/dL   HCT 47.8 39.0 - 52.0 %   MCV 89.5 78.0 - 100.0 fl   MCHC 33.8 30.0 - 36.0 g/dL   RDW 13.7 11.5 - 15.5 %   Platelets 276.0 150.0 - 400.0 K/uL   Neutrophils Relative % 70.2 43.0 - 77.0 %   Lymphocytes Relative 18.9 12.0 - 46.0 %   Monocytes Relative 9.5 3.0 - 12.0 %   Eosinophils Relative 0.8 0.0 - 5.0 %   Basophils Relative 0.6 0.0 - 3.0 %   Neutro Abs 3.5 1.4 - 7.7 K/uL   Lymphs Abs 1.0 0.7 - 4.0 K/uL   Monocytes Absolute 0.5 0.1 - 1.0 K/uL   Eosinophils Absolute 0.0 0.0 - 0.7 K/uL   Basophils Absolute 0.0 0.0 - 0.1 K/uL     Assessment and Plan:   Essential hypertension  Ventral hernia without obstruction or gangrene  Persistently high BP Start below with close f/u.  If stable next week, should be ok to proceed with surgery.  Follow-up: Return in about 1 week (around 10/30/2016).  Meds ordered this encounter  Medications  . lisinopril-hydrochlorothiazide (PRINZIDE,ZESTORETIC) 10-12.5 MG tablet    Sig: Take 1 tablet by mouth daily.    Dispense:  30 tablet    Refill:  1   Signed,  Akeria Hedstrom T. Aditri Louischarles, MD   Allergies as of 10/23/2016   No Known Allergies     Medication List       Accurate as of 10/23/16  1:25 PM. Always use your most recent med list.          ASPERCREME LIDOCAINE EX Apply 1 application topically daily as needed (muscle pain).   aspirin 325 MG EC tablet Take 650 mg by mouth once a week.   Emu Oil Oil Apply 1 application  topically daily as needed (stiffness).  famotidine 10 MG tablet Commonly known as:  PEPCID Take 10 mg by mouth daily as needed for indigestion.   fluticasone 50 MCG/ACT nasal spray Commonly known as:  FLONASE USE TWO SPRAY(S) IN EACH NOSTRIL ONCE DAILY AS NEEDED   ibuprofen 200 MG tablet Commonly known as:  ADVIL,MOTRIN Take 400 mg by mouth daily as needed for headache.   lisinopril-hydrochlorothiazide 10-12.5 MG tablet Commonly known as:  PRINZIDE,ZESTORETIC Take 1 tablet by mouth daily.   Melatonin 3 MG Tabs Take 3 mg by mouth at bedtime as needed (sleep).   venlafaxine XR 37.5 MG 24 hr capsule Commonly known as:  EFFEXOR-XR Take 1 capsule (37.5 mg total) by mouth daily with breakfast.

## 2016-10-23 NOTE — Progress Notes (Signed)
Pre visit review using our clinic review tool, if applicable. No additional management support is needed unless otherwise documented below in the visit note. 

## 2016-10-25 ENCOUNTER — Telehealth: Payer: Self-pay

## 2016-10-25 NOTE — Telephone Encounter (Signed)
Spoke with Dr.Coplands office at this time to check status medical clearance form. Patient has follow up appointment with Dr.Copland on 11/03/16

## 2016-10-26 ENCOUNTER — Encounter
Admission: RE | Admit: 2016-10-26 | Discharge: 2016-10-26 | Disposition: A | Discharge status: Home / Self Care | Payer: 59 | Source: Ambulatory Visit | Attending: General Surgery | Admitting: General Surgery

## 2016-10-26 HISTORY — DX: Essential (primary) hypertension: I10

## 2016-10-26 HISTORY — DX: Gastro-esophageal reflux disease without esophagitis: K21.9

## 2016-10-26 NOTE — Patient Instructions (Addendum)
  Your procedure is scheduled on: 11-03-16  Report to Same Day Surgery 2nd floor medical mall [facility] Entrance-take elevator on left to 2nd floor.  Check in with surgery information desk.) To find out your arrival time please call [phone] between 1PM - 3PM on 11-02-16  Remember: Instructions that are not followed completely may result in serious medical risk, up to and including death, or upon the discretion of your surgeon and anesthesiologist your surgery may need to be rescheduled.    _x___ 1. Do not eat food or drink liquids after midnight. No gum chewing or hard candies.     __x__ 2. No Alcohol for 24 hours before or after surgery.   __x__3. No Smoking for 24 prior to surgery.   ____  4. Bring all medications with you on the day of surgery if instructed.    __x__ 5. Notify your doctor if there is any change in your medical condition     (cold, fever, infections).     Do not wear jewelry, make-up, hairpins, clips or nail polish.  Do not wear lotions, powders, or perfumes. You may wear deodorant.  Do not shave 48 hours prior to surgery. Men may shave face and neck.  Do not bring valuables to the hospital.    [facility] is not responsible for any belongings or valuables.               Contacts, dentures or bridgework may not be worn into surgery.  Leave your suitcase in the car. After surgery it may be brought to your room.  For patients admitted to the hospital, discharge time is determined by your treatment team.   Patients discharged the day of surgery will not be allowed to drive home.  You will need someone to drive you home and stay with you the night of your procedure.    Please read over the following fact sheets that you were given:   [facility] Preparing for Surgery and or MRSA Information   _x___ Take anti-hypertensive (unless it includes a diuretic), cardiac, seizure, asthma,     anti-reflux and psychiatric medicines. These include:  1. PEPCID  2. TAKE AN  EXTRA PEPCID ON Thursday NIGHT BEFORE BED  3.  4.  5.  6.  ____Fleets enema or Magnesium Citrate as directed.   ____ Use CHG Soap or sage wipes as directed on instruction sheet   ____ Use inhalers on the day of surgery and bring to hospital day of surgery  ____ Stop Metformin and Janumet 2 days prior to surgery.    ____ Take 1/2 of usual insulin dose the night before surgery and none on the morning     surgery.   _x___ Follow recommendations from Cardiologist, Pulmonologist or PCP regarding stopping Aspirin, Coumadin, Pllavix ,Eliquis, Effient, or Pradaxa, and Pletal-STOP ASPIRIN NOW  X____Stop Anti-inflammatories such as Advil, Aleve, IBUPROFEN, Motrin, Naproxen, Naprosyn, Goodies powders or aspirin products NOW-OK to take Tylenol   _x___ Stop supplements until after surgery-STOP MELATONIN NOW     ____ Bring C-Pap to the hospital.

## 2016-10-30 ENCOUNTER — Encounter: Payer: Self-pay | Admitting: Family Medicine

## 2016-10-30 ENCOUNTER — Ambulatory Visit (INDEPENDENT_AMBULATORY_CARE_PROVIDER_SITE_OTHER): Payer: Medicare Other | Admitting: Family Medicine

## 2016-10-30 VITALS — BP 122/80 | HR 82 | Temp 98.5°F | Ht 71.0 in | Wt 224.5 lb

## 2016-10-30 DIAGNOSIS — Z01818 Encounter for other preprocedural examination: Secondary | ICD-10-CM

## 2016-10-30 DIAGNOSIS — Z79899 Other long term (current) drug therapy: Secondary | ICD-10-CM

## 2016-10-30 DIAGNOSIS — K436 Other and unspecified ventral hernia with obstruction, without gangrene: Secondary | ICD-10-CM | POA: Diagnosis not present

## 2016-10-30 DIAGNOSIS — I10 Essential (primary) hypertension: Secondary | ICD-10-CM | POA: Diagnosis not present

## 2016-10-30 LAB — CBC WITH DIFFERENTIAL/PLATELET
BASOS PCT: 1 % (ref 0.0–3.0)
Basophils Absolute: 0 10*3/uL (ref 0.0–0.1)
EOS PCT: 1.4 % (ref 0.0–5.0)
Eosinophils Absolute: 0.1 10*3/uL (ref 0.0–0.7)
HCT: 47.3 % (ref 39.0–52.0)
Hemoglobin: 16 g/dL (ref 13.0–17.0)
LYMPHS ABS: 0.9 10*3/uL (ref 0.7–4.0)
Lymphocytes Relative: 21 % (ref 12.0–46.0)
MCHC: 33.9 g/dL (ref 30.0–36.0)
MCV: 88.9 fl (ref 78.0–100.0)
MONOS PCT: 15.5 % — AB (ref 3.0–12.0)
Monocytes Absolute: 0.6 10*3/uL (ref 0.1–1.0)
NEUTROS ABS: 2.5 10*3/uL (ref 1.4–7.7)
NEUTROS PCT: 61.1 % (ref 43.0–77.0)
PLATELETS: 252 10*3/uL (ref 150.0–400.0)
RBC: 5.32 Mil/uL (ref 4.22–5.81)
RDW: 13.7 % (ref 11.5–15.5)
WBC: 4.1 10*3/uL (ref 4.0–10.5)

## 2016-10-30 LAB — BASIC METABOLIC PANEL
BUN: 22 mg/dL (ref 6–23)
CO2: 33 meq/L — AB (ref 19–32)
Calcium: 9.6 mg/dL (ref 8.4–10.5)
Chloride: 97 mEq/L (ref 96–112)
Creatinine, Ser: 1.05 mg/dL (ref 0.40–1.50)
GFR: 75.33 mL/min (ref >60.00)
GLUCOSE: 93 mg/dL (ref 70–99)
POTASSIUM: 4.6 meq/L (ref 3.5–5.1)
Sodium: 135 mEq/L (ref 135–145)

## 2016-10-30 LAB — HEPATIC FUNCTION PANEL
ALT: 16 U/L (ref 0–53)
AST: 17 U/L (ref 0–37)
Albumin: 4.2 g/dL (ref 3.5–5.2)
Alkaline Phosphatase: 100 U/L (ref 39–117)
Bilirubin, Direct: 0.1 mg/dL (ref 0.0–0.3)
Total Bilirubin: 0.7 mg/dL (ref 0.2–1.2)
Total Protein: 7.1 g/dL (ref 6.0–8.3)

## 2016-10-30 NOTE — Progress Notes (Addendum)
Dr. Frederico Hamman T. Humza Tallerico, MD, [facility] and Sports Medicine [place], [address] Phone: [phone] Fax: [phone]  10/30/2016  Patient: Timothy Wang, MRN: [id number], DOB: [birth year], 66 y.o.  Primary Physician:  Owens Loffler, MD   Chief Complaint  Patient presents with  . Follow-up   Subjective:   Timothy Wang is a 66 y.o. very pleasant male patient who presents with the following:  BP Readings from Last 3 Encounters:  10/30/16 122/80  10/23/16 (!) 126/98  10/19/16 (!) 180/105    Wt Readings from Last 3 Encounters:  10/30/16 224 lb 8 oz (101.8 kg)  10/23/16 233 lb 4 oz (105.8 kg)  10/19/16 233 lb (105.7 kg)    He is doing well on his new BP meds - at goal now. No SE  Anesthesia has asked that we do an EKG, also.   Past Medical History, Surgical History, Social History, Family History, Problem List, Medications, and Allergies have been reviewed and updated if relevant.  Patient Active Problem List   Diagnosis Date Noted  . Tendon rupture, traumatic. quadriceps, left, subsequent encounter 06/11/2014  . Essential hypertension 08/04/2010  . Hyperlipidemia 05/28/2008  . Ventral hernia 05/28/2008  . FATIGUE 05/28/2008  . DEPRESSION 04/21/2008  . ALLERGIC RHINITIS 04/21/2008    Past Medical History:  Diagnosis Date  . Allergic rhinitis due to pollen   . Depression   . GERD (gastroesophageal reflux disease)   . H/O: Bell's palsy   . Hyperlipidemia   . Hypertension   . Ventral hernia     Past Surgical History:  Procedure Laterality Date  . COLONOSCOPY    . [place]  . KNEE SURGERY  2015  . TONSILLECTOMY  1960    Social History   Social History  . Marital status: Single    Spouse name: N/A  . Number of children: N/A  . 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   Social History Main Topics  . Smoking status: Former Smoker    Packs/day: 1.00    Years: 10.00   Types: Cigarettes    Quit date: 10/27/2002  . Smokeless tobacco: Never Used     Comment: quit 2004  . Alcohol use 2.4 oz/week    4 Cans of beer per week     Comment: OCC BBER/WINE  . Drug use: No  . Sexual activity: Not on file   Other Topics Concern  . Not on file   Social History Narrative  . No narrative on file    Family History  Problem Relation Age of Onset  . Pancreatic cancer Mother   . Alcohol abuse Father   . Heart attack Father   . Thyroid disease Sister   . Depression Sister   . Cancer Sister     breast    No Known Allergies  Medication list reviewed and updated in full in [place].   GEN: No acute illnesses, no fevers, chills. GI: No n/v/d, eating normally Pulm: No SOB Interactive and getting along well at home.  Otherwise, ROS is as per the HPI.  Objective:   BP 122/80   Pulse 82   Temp 98.5 F (36.9 C) (Oral)   Ht 5\' 11"  (1.803 m)   Wt 224 lb 8 oz (101.8 kg)   BMI 31.31 kg/m   GEN: WDWN, NAD, Non-toxic, A & O x 3 HEENT: Atraumatic, Normocephalic. Neck supple. No masses,  No LAD. Ears and Nose: No external deformity. CV: RRR, No M/G/R. No JVD. No thrill. No extra heart sounds. PULM: CTA B, no wheezes, crackles, rhonchi. No retractions. No resp. distress. No accessory muscle use. EXTR: No c/c/e NEURO Normal gait.  PSYCH: Normally interactive. Conversant. Not depressed or anxious appearing.  Calm demeanor.   Laboratory and Imaging Data: Results for orders placed or performed in visit on 16/10/96  Basic metabolic panel  Result Value Ref Range   Sodium 135 135 - 145 mEq/L   Potassium 4.6 3.5 - 5.1 mEq/L   Chloride 97 96 - 112 mEq/L   CO2 33 (H) 19 - 32 mEq/L   Glucose, Bld 93 70 - 99 mg/dL   BUN 22 6 - 23 mg/dL   Creatinine, Ser 1.05 0.40 - 1.50 mg/dL   Calcium 9.6 8.4 - 10.5 mg/dL   GFR 75.33 >60.00 mL/min  Hepatic function panel  Result Value Ref Range   Total Bilirubin 0.7 0.2 - 1.2 mg/dL   Bilirubin, Direct 0.1 0.0 - 0.3  mg/dL   Alkaline Phosphatase 100 39 - 117 U/L   AST 17 0 - 37 U/L   ALT 16 0 - 53 U/L   Total Protein 7.1 6.0 - 8.3 g/dL   Albumin 4.2 3.5 - 5.2 g/dL  CBC with Differential/Platelet  Result Value Ref Range   WBC 4.1 4.0 - 10.5 K/uL   RBC 5.32 4.22 - 5.81 Mil/uL   Hemoglobin 16.0 13.0 - 17.0 g/dL   HCT 47.3 39.0 - 52.0 %   MCV 88.9 78.0 - 100.0 fl   MCHC 33.9 30.0 - 36.0 g/dL   RDW 13.7 11.5 - 15.5 %   Platelets 252.0 150.0 - 400.0 K/uL   Neutrophils Relative % 61.1 43.0 - 77.0 %   Lymphocytes Relative 21.0 12.0 - 46.0 %   Monocytes Relative 15.5 (H) 3.0 - 12.0 %   Eosinophils Relative 1.4 0.0 - 5.0 %   Basophils Relative 1.0 0.0 - 3.0 %   Neutro Abs 2.5 1.4 - 7.7 K/uL   Lymphs Abs 0.9 0.7 - 4.0 K/uL   Monocytes Absolute 0.6 0.1 - 1.0 K/uL   Eosinophils Absolute 0.1 0.0 - 0.7 K/uL   Basophils Absolute 0.0 0.0 - 0.1 K/uL     Assessment and Plan:   Preop exam for internal medicine  Essential hypertension - Plan: EKG 04-[id number], Basic metabolic panel, Hepatic function panel, CBC with Differential/Platelet  Encounter for long-term (current) use of medications - Plan: Basic metabolic panel, Hepatic function panel, CBC with Differential/Platelet  Ventral hernia with obstruction and without gangrene  EKG: Normal sinus rhythm. Normal axis, normal R wave progression, No acute ST elevation or depression. No Q waves apparent.  All labs reassuring.  BP stable.   Potential benefits of surgery outweigh risks in this case with hernia repair.  Follow-up: No Follow-up on file.  Orders Placed This Encounter  Procedures  . Basic metabolic panel  . Hepatic function panel  . CBC with Differential/Platelet  . EKG 12-Lead    Signed,  Edan Serratore T. Mayank Teuscher, MD   Allergies as of 10/30/2016   No Known Allergies     Medication List       Accurate as of 10/30/16  8:43 AM. Always use your most recent med list.          ASPERCREME LIDOCAINE EX Apply 1 application topically daily as  needed (muscle pain).   aspirin 325 MG EC tablet Take 650 mg by mouth once  a week.   Emu Oil Oil Apply 1 application topically daily as needed (stiffness).   famotidine 10 MG tablet Commonly known as:  PEPCID Take 10 mg by mouth daily as needed for indigestion.   fluticasone 50 MCG/ACT nasal spray Commonly known as:  FLONASE USE TWO SPRAY(S) IN EACH NOSTRIL ONCE DAILY AS NEEDED   ibuprofen 200 MG tablet Commonly known as:  ADVIL,MOTRIN Take 400 mg by mouth daily as needed for headache.   lisinopril-hydrochlorothiazide 10-12.5 MG tablet Commonly known as:  PRINZIDE,ZESTORETIC Take 1 tablet by mouth daily.   Melatonin 3 MG Tabs Take 3 mg by mouth at bedtime as needed (sleep).   venlafaxine XR 37.5 MG 24 hr capsule Commonly known as:  EFFEXOR-XR Take 1 capsule (37.5 mg total) by mouth daily with breakfast.

## 2016-10-30 NOTE — Progress Notes (Signed)
Pre visit review using our clinic review tool, if applicable. No additional management support is needed unless otherwise documented below in the visit note. 

## 2016-10-31 ENCOUNTER — Telehealth: Payer: Self-pay

## 2016-10-31 NOTE — Telephone Encounter (Signed)
Medical Clearance obtained from [facility] at this time and will be scanned under Media.

## 2016-11-01 NOTE — Pre-Procedure Instructions (Signed)
Progress Notes Encounter Date: 10/30/2016 Timothy Loffler, MD  Family Medicine    [] Hide copied text [] Hover for attribution information   Dr. Frederico Hamman T. Copland, MD, [facility] [place], [address] Phone: [phone] Fax: [phone]  10/30/2016  Patient: Timothy Wang, MRN: [id number], DOB: 06-11-1951, 66 y.o.  Primary Physician:  Timothy Loffler, MD      Chief Complaint  Patient presents with  . Follow-up   Subjective:   Timothy Wang is a 66 y.o. very pleasant male patient who presents with the following:     BP Readings from Last 3 Encounters:  10/30/16 122/80  10/23/16 (!) 126/98  10/19/16 (!) 180/105       Wt Readings from Last 3 Encounters:  10/30/16 224 lb 8 oz (101.8 kg)  10/23/16 233 lb 4 oz (105.8 kg)  10/19/16 233 lb (105.7 kg)    He is doing well on his new BP meds - at goal now. No SE  Anesthesia has asked that we do an EKG, also.   Past Medical History, Surgical History, Social History, Family History, Problem List, Medications, and Allergies have been reviewed and updated if relevant.      Patient Active Problem List   Diagnosis Date Noted  . Tendon rupture, traumatic. quadriceps, left, subsequent encounter 06/11/2014  . Essential hypertension 08/04/2010  . Hyperlipidemia 05/28/2008  . Ventral hernia 05/28/2008  . FATIGUE 05/28/2008  . DEPRESSION 04/21/2008  . ALLERGIC RHINITIS 04/21/2008        Past Medical History:  Diagnosis Date  . Allergic rhinitis due to pollen   . Depression   . GERD (gastroesophageal reflux disease)   . H/O: Bell's palsy   . Hyperlipidemia   . Hypertension   . Ventral hernia          Past Surgical History:  Procedure Laterality Date  . COLONOSCOPY    . [place]  . KNEE SURGERY  2015  . TONSILLECTOMY  1960    Social History        Social History  . Marital status: Single    Spouse name: N/A    . Number of children: N/A  . Years of education: N/A       Occupational History  . 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         Social History Main Topics  . Smoking status: Former Smoker    Packs/day: 1.00    Years: 10.00    Types: Cigarettes    Quit date: 10/27/2002  . Smokeless tobacco: Never Used     Comment: quit 2004  . Alcohol use 2.4 oz/week    4 Cans of beer per week     Comment: OCC BBER/WINE  . Drug use: No  . Sexual activity: Not on file       Other Topics Concern  . Not on file      Social History Narrative  . No narrative on file          Family History  Problem Relation Age of Onset  . Pancreatic cancer Mother   . Alcohol abuse Father   . Heart attack Father   . Thyroid disease Sister   . Depression Sister   . Cancer Sister     breast    No Known Allergies  Medication list reviewed and updated in full in [facility].   GEN: No acute illnesses, no fevers, chills. GI: No n/v/d, eating normally  Pulm: No SOB Interactive and getting along well at home.  Otherwise, ROS is as per the HPI.  Objective:   BP 122/80   Pulse 82   Temp 98.5 F (36.9 C) (Oral)   Ht 5\' 11"  (1.803 m)   Wt 224 lb 8 oz (101.8 kg)   BMI 31.31 kg/m   GEN: WDWN, NAD, Non-toxic, A & O x 3 HEENT: Atraumatic, Normocephalic. Neck supple. No masses, No LAD. Ears and Nose: No external deformity. CV: RRR, No M/G/R. No JVD. No thrill. No extra heart sounds. PULM: CTA B, no wheezes, crackles, rhonchi. No retractions. No resp. distress. No accessory muscle use. EXTR: No c/c/e NEURO Normal gait.  PSYCH: Normally interactive. Conversant. Not depressed or anxious appearing.  Calm demeanor.   Laboratory and Imaging Data:      Results for orders placed or performed in visit on 02/54/27  Basic metabolic panel  Result Value Ref Range   Sodium 135 135 - 145 mEq/L   Potassium 4.6 3.5 - 5.1 mEq/L   Chloride 97 96 - 112 mEq/L   CO2 33 (H) 19 - 32  mEq/L   Glucose, Bld 93 70 - 99 mg/dL   BUN 22 6 - 23 mg/dL   Creatinine, Ser 1.05 0.40 - 1.50 mg/dL   Calcium 9.6 8.4 - 10.5 mg/dL   GFR 75.33 >60.00 mL/min  Hepatic function panel  Result Value Ref Range   Total Bilirubin 0.7 0.2 - 1.2 mg/dL   Bilirubin, Direct 0.1 0.0 - 0.3 mg/dL   Alkaline Phosphatase 100 39 - 117 U/L   AST 17 0 - 37 U/L   ALT 16 0 - 53 U/L   Total Protein 7.1 6.0 - 8.3 g/dL   Albumin 4.2 3.5 - 5.2 g/dL  CBC with Differential/Platelet  Result Value Ref Range   WBC 4.1 4.0 - 10.5 K/uL   RBC 5.32 4.22 - 5.81 Mil/uL   Hemoglobin 16.0 13.0 - 17.0 g/dL   HCT 47.3 39.0 - 52.0 %   MCV 88.9 78.0 - 100.0 fl   MCHC 33.9 30.0 - 36.0 g/dL   RDW 13.7 11.5 - 15.5 %   Platelets 252.0 150.0 - 400.0 K/uL   Neutrophils Relative % 61.1 43.0 - 77.0 %   Lymphocytes Relative 21.0 12.0 - 46.0 %   Monocytes Relative 15.5 (H) 3.0 - 12.0 %   Eosinophils Relative 1.4 0.0 - 5.0 %   Basophils Relative 1.0 0.0 - 3.0 %   Neutro Abs 2.5 1.4 - 7.7 K/uL   Lymphs Abs 0.9 0.7 - 4.0 K/uL   Monocytes Absolute 0.6 0.1 - 1.0 K/uL   Eosinophils Absolute 0.1 0.0 - 0.7 K/uL   Basophils Absolute 0.0 0.0 - 0.1 K/uL     Assessment and Plan:   Preop exam for internal medicine  Essential hypertension - Plan: EKG 06-CBJS, Basic metabolic panel, Hepatic function panel, CBC with Differential/Platelet  Encounter for long-term (current) use of medications - Plan: Basic metabolic panel, Hepatic function panel, CBC with Differential/Platelet  Ventral hernia with obstruction and without gangrene  EKG: Normal sinus rhythm. Normal axis, normal R wave progression, No acute ST elevation or depression. No Q waves apparent.  All labs reassuring.  BP stable.   Potential benefits of surgery outweigh risks in this case with hernia repair.  Follow-up: No Follow-up on file.     Orders Placed This Encounter  Procedures  . Basic metabolic panel  . Hepatic function  panel  . CBC with Differential/Platelet  .  EKG 12-Lead    Signed,  Spencer T. Copland, MD   Allergies as of 10/30/2016   No Known Allergies              Medication List           Accurate as of 10/30/16  8:43 AM. Always use your most recent med list.           ASPERCREME LIDOCAINE EX Apply 1 application topically daily as needed (muscle pain).   aspirin 325 MG EC tablet Take 650 mg by mouth once a week.   Emu Oil Oil Apply 1 application topically daily as needed (stiffness).   famotidine 10 MG tablet Commonly known as:  PEPCID Take 10 mg by mouth daily as needed for indigestion.   fluticasone 50 MCG/ACT nasal spray Commonly known as:  FLONASE USE TWO SPRAY(S) IN EACH NOSTRIL ONCE DAILY AS NEEDED   ibuprofen 200 MG tablet Commonly known as:  ADVIL,MOTRIN Take 400 mg by mouth daily as needed for headache.   lisinopril-hydrochlorothiazide 10-12.5 MG tablet Commonly known as:  PRINZIDE,ZESTORETIC Take 1 tablet by mouth daily.   Melatonin 3 MG Tabs Take 3 mg by mouth at bedtime as needed (sleep).   venlafaxine XR 37.5 MG 24 hr capsule Commonly known as:  EFFEXOR-XR Take 1 capsule (37.5 mg total) by mouth daily with breakfast.          Electronically signed by Timothy Loffler, MD at 10/30/2016 8:44 AM Electronically signed by Timothy Loffler, MD at 10/30/2016 1:46 PM      Office Visit on 10/30/2016        Revision History        Detailed Report

## 2016-11-02 MED ORDER — CEFAZOLIN SODIUM-DEXTROSE 2-4 GM/100ML-% IV SOLN
2.0000 g | INTRAVENOUS | Status: AC
  Administered 2016-11-03: 2 g via INTRAVENOUS

## 2016-11-02 NOTE — Pre-Procedure Instructions (Signed)
MEDICAL CLEARANCE ON CHART AND IN EPIC FROM DR 

## 2016-11-03 ENCOUNTER — Encounter
Admission: RE | Disposition: A | Discharge status: Home / Self Care | Payer: Self-pay | Source: Ambulatory Visit | Attending: General Surgery

## 2016-11-03 ENCOUNTER — Ambulatory Visit: Payer: Medicare Other | Admitting: Anesthesiology

## 2016-11-03 ENCOUNTER — Encounter: Payer: Self-pay | Admitting: *Deleted

## 2016-11-03 ENCOUNTER — Ambulatory Visit
Admission: RE | Admit: 2016-11-03 | Discharge: 2016-11-03 | Disposition: A | Discharge status: Home / Self Care | Payer: Medicare Other | Source: Ambulatory Visit | Attending: General Surgery | Admitting: General Surgery

## 2016-11-03 DIAGNOSIS — E785 Hyperlipidemia, unspecified: Secondary | ICD-10-CM | POA: Insufficient documentation

## 2016-11-03 DIAGNOSIS — Z800 Family history of malignant neoplasm of digestive organs: Secondary | ICD-10-CM | POA: Insufficient documentation

## 2016-11-03 DIAGNOSIS — Z818 Family history of other mental and behavioral disorders: Secondary | ICD-10-CM | POA: Diagnosis not present

## 2016-11-03 DIAGNOSIS — F329 Major depressive disorder, single episode, unspecified: Secondary | ICD-10-CM | POA: Insufficient documentation

## 2016-11-03 DIAGNOSIS — Z811 Family history of alcohol abuse and dependence: Secondary | ICD-10-CM | POA: Diagnosis not present

## 2016-11-03 DIAGNOSIS — Z8669 Personal history of other diseases of the nervous system and sense organs: Secondary | ICD-10-CM | POA: Insufficient documentation

## 2016-11-03 DIAGNOSIS — K439 Ventral hernia without obstruction or gangrene: Secondary | ICD-10-CM | POA: Diagnosis not present

## 2016-11-03 DIAGNOSIS — Z8249 Family history of ischemic heart disease and other diseases of the circulatory system: Secondary | ICD-10-CM | POA: Diagnosis not present

## 2016-11-03 DIAGNOSIS — Z8349 Family history of other endocrine, nutritional and metabolic diseases: Secondary | ICD-10-CM | POA: Diagnosis not present

## 2016-11-03 DIAGNOSIS — Z87891 Personal history of nicotine dependence: Secondary | ICD-10-CM | POA: Diagnosis not present

## 2016-11-03 DIAGNOSIS — J301 Allergic rhinitis due to pollen: Secondary | ICD-10-CM | POA: Insufficient documentation

## 2016-11-03 DIAGNOSIS — Z803 Family history of malignant neoplasm of breast: Secondary | ICD-10-CM | POA: Diagnosis not present

## 2016-11-03 DIAGNOSIS — K219 Gastro-esophageal reflux disease without esophagitis: Secondary | ICD-10-CM | POA: Diagnosis not present

## 2016-11-03 DIAGNOSIS — I10 Essential (primary) hypertension: Secondary | ICD-10-CM | POA: Diagnosis not present

## 2016-11-03 HISTORY — PX: VENTRAL HERNIA REPAIR: SHX424

## 2016-11-03 SURGERY — REPAIR, HERNIA, VENTRAL
Anesthesia: General | Wound class: Clean

## 2016-11-03 MED ORDER — FENTANYL CITRATE (PF) 100 MCG/2ML IJ SOLN: 25.0000 ug | INTRAMUSCULAR | Status: DC | PRN

## 2016-11-03 MED ORDER — LIDOCAINE HCL (CARDIAC) 20 MG/ML IV SOLN
INTRAVENOUS | Status: DC | PRN
  Administered 2016-11-03: 100 mg via INTRAVENOUS

## 2016-11-03 MED ORDER — DEXAMETHASONE SODIUM PHOSPHATE 10 MG/ML IJ SOLN
INTRAMUSCULAR | Status: AC
  Filled 2016-11-03: qty 1

## 2016-11-03 MED ORDER — BUPIVACAINE LIPOSOME 1.3 % IJ SUSP
INTRAMUSCULAR | Status: AC
  Filled 2016-11-03: qty 20

## 2016-11-03 MED ORDER — BUPIVACAINE-EPINEPHRINE (PF) 0.5% -1:200000 IJ SOLN
INTRAMUSCULAR | Status: AC
  Filled 2016-11-03: qty 30

## 2016-11-03 MED ORDER — SODIUM CHLORIDE 0.9 % IV SOLN
INTRAVENOUS | Status: DC | PRN
  Administered 2016-11-03: 40 mL

## 2016-11-03 MED ORDER — ONDANSETRON 4 MG PO TBDP: 4.0000 mg | ORAL_TABLET | Freq: Three times a day (TID) | ORAL | 0 refills | Status: DC | PRN

## 2016-11-03 MED ORDER — PHENYLEPHRINE HCL 10 MG/ML IJ SOLN
INTRAMUSCULAR | Status: AC
  Filled 2016-11-03: qty 1

## 2016-11-03 MED ORDER — EPHEDRINE SULFATE 50 MG/ML IJ SOLN
INTRAMUSCULAR | Status: AC
  Filled 2016-11-03: qty 1

## 2016-11-03 MED ORDER — DOCUSATE SODIUM 100 MG PO CAPS: 100.0000 mg | ORAL_CAPSULE | Freq: Two times a day (BID) | ORAL | 0 refills | Status: DC

## 2016-11-03 MED ORDER — MIDAZOLAM HCL 2 MG/2ML IJ SOLN
INTRAMUSCULAR | Status: DC | PRN
  Administered 2016-11-03: 2 mg via INTRAVENOUS

## 2016-11-03 MED ORDER — ACETAMINOPHEN 10 MG/ML IV SOLN
INTRAVENOUS | Status: DC | PRN
  Administered 2016-11-03: 1000 mg via INTRAVENOUS

## 2016-11-03 MED ORDER — DEXAMETHASONE SODIUM PHOSPHATE 10 MG/ML IJ SOLN
INTRAMUSCULAR | Status: DC | PRN
  Administered 2016-11-03: 10 mg via INTRAVENOUS

## 2016-11-03 MED ORDER — ONDANSETRON HCL 4 MG/2ML IJ SOLN
INTRAMUSCULAR | Status: DC | PRN
  Administered 2016-11-03: 4 mg via INTRAVENOUS

## 2016-11-03 MED ORDER — OXYCODONE HCL 5 MG PO TABS: 5.0000 mg | ORAL_TABLET | Freq: Once | ORAL | Status: DC | PRN

## 2016-11-03 MED ORDER — DIPHENHYDRAMINE HCL 50 MG/ML IJ SOLN
INTRAMUSCULAR | Status: AC
  Filled 2016-11-03: qty 1

## 2016-11-03 MED ORDER — CHLORHEXIDINE GLUCONATE CLOTH 2 % EX PADS: 6.0000 | MEDICATED_PAD | Freq: Once | CUTANEOUS | Status: DC

## 2016-11-03 MED ORDER — FENTANYL CITRATE (PF) 100 MCG/2ML IJ SOLN
INTRAMUSCULAR | Status: DC | PRN
  Administered 2016-11-03: 100 ug via INTRAVENOUS

## 2016-11-03 MED ORDER — HYDROCODONE-ACETAMINOPHEN 5-325 MG PO TABS: 1.0000 | ORAL_TABLET | Freq: Four times a day (QID) | ORAL | 0 refills | Status: DC | PRN

## 2016-11-03 MED ORDER — LIDOCAINE HCL (PF) 2 % IJ SOLN
INTRAMUSCULAR | Status: AC
Start: 2016-11-03 — End: 2016-11-03
  Filled 2016-11-03: qty 2

## 2016-11-03 MED ORDER — MIDAZOLAM HCL 2 MG/2ML IJ SOLN
INTRAMUSCULAR | Status: AC
  Filled 2016-11-03: qty 2

## 2016-11-03 MED ORDER — SODIUM CHLORIDE 0.9 % IJ SOLN
INTRAMUSCULAR | Status: AC
  Filled 2016-11-03: qty 10

## 2016-11-03 MED ORDER — LIDOCAINE HCL 1 % IJ SOLN
INTRAMUSCULAR | Status: DC | PRN
  Administered 2016-11-03: 10 mL

## 2016-11-03 MED ORDER — SUGAMMADEX SODIUM 200 MG/2ML IV SOLN
INTRAVENOUS | Status: DC | PRN
  Administered 2016-11-03: 200 mg via INTRAVENOUS

## 2016-11-03 MED ORDER — SODIUM CHLORIDE 0.9 % IJ SOLN
INTRAMUSCULAR | Status: AC
  Filled 2016-11-03: qty 20

## 2016-11-03 MED ORDER — PHENYLEPHRINE HCL 10 MG/ML IJ SOLN
INTRAMUSCULAR | Status: DC | PRN
  Administered 2016-11-03 (×5): 100 ug via INTRAVENOUS

## 2016-11-03 MED ORDER — ROCURONIUM BROMIDE 50 MG/5ML IV SOLN
INTRAVENOUS | Status: AC
  Filled 2016-11-03: qty 1

## 2016-11-03 MED ORDER — DIPHENHYDRAMINE HCL 50 MG/ML IJ SOLN
INTRAMUSCULAR | Status: DC | PRN
  Administered 2016-11-03: 12.5 mg via INTRAVENOUS

## 2016-11-03 MED ORDER — PROPOFOL 10 MG/ML IV BOLUS
INTRAVENOUS | Status: DC | PRN
  Administered 2016-11-03: 150 mg via INTRAVENOUS

## 2016-11-03 MED ORDER — ONDANSETRON HCL 4 MG/2ML IJ SOLN
INTRAMUSCULAR | Status: AC
  Filled 2016-11-03: qty 2

## 2016-11-03 MED ORDER — LIDOCAINE HCL (PF) 1 % IJ SOLN
INTRAMUSCULAR | Status: AC
  Filled 2016-11-03: qty 30

## 2016-11-03 MED ORDER — FENTANYL CITRATE (PF) 100 MCG/2ML IJ SOLN
INTRAMUSCULAR | Status: AC
  Filled 2016-11-03: qty 2

## 2016-11-03 MED ORDER — OXYCODONE HCL 5 MG/5ML PO SOLN: 5.0000 mg | Freq: Once | ORAL | Status: DC | PRN

## 2016-11-03 MED ORDER — SUCCINYLCHOLINE CHLORIDE 20 MG/ML IJ SOLN
INTRAMUSCULAR | Status: AC
  Filled 2016-11-03: qty 1

## 2016-11-03 MED ORDER — CEFAZOLIN SODIUM-DEXTROSE 2-4 GM/100ML-% IV SOLN
INTRAVENOUS | Status: AC
  Administered 2016-11-03: 2 g via INTRAVENOUS
  Filled 2016-11-03: qty 100

## 2016-11-03 MED ORDER — PROPOFOL 10 MG/ML IV BOLUS
INTRAVENOUS | Status: AC
  Filled 2016-11-03: qty 40

## 2016-11-03 MED ORDER — ROCURONIUM BROMIDE 100 MG/10ML IV SOLN
INTRAVENOUS | Status: DC | PRN
  Administered 2016-11-03: 20 mg via INTRAVENOUS
  Administered 2016-11-03: 10 mg via INTRAVENOUS
  Administered 2016-11-03: 40 mg via INTRAVENOUS

## 2016-11-03 MED ORDER — CHLORHEXIDINE GLUCONATE CLOTH 2 % EX PADS
6.0000 | MEDICATED_PAD | Freq: Once | CUTANEOUS | Status: DC
Start: 2016-11-03 — End: 2016-11-03

## 2016-11-03 MED ORDER — ACETAMINOPHEN 10 MG/ML IV SOLN
INTRAVENOUS | Status: AC
  Filled 2016-11-03: qty 100

## 2016-11-03 MED ORDER — LACTATED RINGERS IV SOLN
INTRAVENOUS | Status: DC
  Administered 2016-11-03: 07:00:00 via INTRAVENOUS

## 2016-11-03 MED ORDER — GLYCOPYRROLATE 0.2 MG/ML IJ SOLN
INTRAMUSCULAR | Status: AC
  Filled 2016-11-03: qty 1

## 2016-11-03 SURGICAL SUPPLY — 35 items
BLADE SURG 15 STRL LF DISP TIS (BLADE) ×1 IMPLANT
BLADE SURG 15 STRL SS (BLADE) ×2
CHLORAPREP W/TINT 26ML (MISCELLANEOUS) ×3 IMPLANT
DERMABOND ADVANCED (GAUZE/BANDAGES/DRESSINGS) ×2
DERMABOND ADVANCED .7 DNX12 (GAUZE/BANDAGES/DRESSINGS) ×1 IMPLANT
DRAIN PENROSE 1/4X12 LTX (DRAIN) IMPLANT
DRAPE LAPAROTOMY T 102X78X121 (DRAPES) ×3 IMPLANT
ELECT CAUTERY BLADE TIP 2.5 (TIP) ×3
ELECT REM PT RETURN 9FT ADLT (ELECTROSURGICAL) ×3
ELECTRODE CAUTERY BLDE TIP 2.5 (TIP) ×1 IMPLANT
ELECTRODE REM PT RTRN 9FT ADLT (ELECTROSURGICAL) ×1 IMPLANT
GLOVE BIO SURGEON STRL SZ7.5 (GLOVE) ×12 IMPLANT
GLOVE BIOGEL PI IND STRL 6.5 (GLOVE) ×2 IMPLANT
GLOVE BIOGEL PI INDICATOR 6.5 (GLOVE) ×4
GLOVE INDICATOR 8.0 STRL GRN (GLOVE) ×9 IMPLANT
GOWN STRL REUS W/ TWL LRG LVL3 (GOWN DISPOSABLE) ×4 IMPLANT
GOWN STRL REUS W/TWL LRG LVL3 (GOWN DISPOSABLE) ×8
KIT RM TURNOVER STRD PROC AR (KITS) ×3 IMPLANT
MESH VENTRALEX ST 2.5 CRC MED (Mesh General) ×3 IMPLANT
NEEDLE HYPO 25GX1X1/2 BEV (NEEDLE) ×3 IMPLANT
NEEDLE HYPO 25X1 1.5 SAFETY (NEEDLE) ×3 IMPLANT
NS IRRIG 500ML POUR BTL (IV SOLUTION) ×3 IMPLANT
PACK BASIN MINOR ARMC (MISCELLANEOUS) ×3 IMPLANT
STRAP BODY AND KNEE 60X3 (MISCELLANEOUS) ×3 IMPLANT
SUT ETHIBOND 0 MO6 C/R (SUTURE) ×3 IMPLANT
SUT MNCRL 4-0 (SUTURE) ×2
SUT MNCRL 4-0 27XMFL (SUTURE) ×1
SUT SILK 2 0 SH (SUTURE) IMPLANT
SUT VIC AB 2-0 CT2 27 (SUTURE) ×3 IMPLANT
SUT VIC AB 3-0 SH 27 (SUTURE) ×2
SUT VIC AB 3-0 SH 27X BRD (SUTURE) ×1 IMPLANT
SUTURE MNCRL 4-0 27XMF (SUTURE) ×1 IMPLANT
SYR 20CC LL (SYRINGE) ×3 IMPLANT
SYR BULB IRRIG 60ML STRL (SYRINGE) IMPLANT
SYRINGE 10CC LL (SYRINGE) ×3 IMPLANT

## 2016-11-03 NOTE — Anesthesia Preprocedure Evaluation (Signed)
Anesthesia Evaluation  Patient identified by MRN, date of birth, ID band Patient awake    Reviewed: Allergy & Precautions, H&P , NPO status , Patient's Chart, lab work & pertinent test results  History of Anesthesia Complications Negative for: history of anesthetic complications  Airway Mallampati: III  TM Distance: <3 FB Neck ROM: limited    Dental  (+) Poor Dentition, Chipped   Pulmonary neg shortness of breath, former smoker,    Pulmonary exam normal breath sounds clear to auscultation       Cardiovascular Exercise Tolerance: Good hypertension, (-) angina(-) Past MI and (-) DOE Normal cardiovascular exam Rhythm:regular Rate:Normal     Neuro/Psych PSYCHIATRIC DISORDERS Depression negative neurological ROS     GI/Hepatic Neg liver ROS, GERD  Controlled and Medicated,  Endo/Other  negative endocrine ROS  Renal/GU      Musculoskeletal   Abdominal   Peds  Hematology negative hematology ROS (+)   Anesthesia Other Findings Past Medical History: No date: Allergic rhinitis due to pollen No date: Depression No date: GERD (gastroesophageal reflux disease) No date: H/O: Bell's palsy No date: Hyperlipidemia No date: Hypertension No date: Ventral hernia  Past Surgical History: No date: COLONOSCOPY 1987: HEEL SPUR SURGERY 2015: KNEE SURGERY 1960: TONSILLECTOMY  BMI    Body Mass Index:  29.84 kg/m      Reproductive/Obstetrics negative OB ROS                             Anesthesia Physical Anesthesia Plan  ASA: III  Anesthesia Plan: General ETT   Post-op Pain Management:    Induction:   Airway Management Planned:   Additional Equipment:   Intra-op Plan:   Post-operative Plan:   Informed Consent: I have reviewed the patients History and Physical, chart, labs and discussed the procedure including the risks, benefits and alternatives for the proposed anesthesia with the  patient or authorized representative who has indicated his/her understanding and acceptance.   Dental Advisory Given  Plan Discussed with: Anesthesiologist, CRNA and Surgeon  Anesthesia Plan Comments:         Anesthesia Quick Evaluation

## 2016-11-03 NOTE — Anesthesia Postprocedure Evaluation (Signed)
Anesthesia Post Note  Patient: Timothy Wang  Procedure(s) Performed: Procedure(s) (LRB): HERNIA REPAIR VENTRAL ADULT WITH MESH (N/A)  Patient location during evaluation: PACU Anesthesia Type: General Level of consciousness: awake and alert Pain management: pain level controlled Vital Signs Assessment: post-procedure vital signs reviewed and stable Respiratory status: spontaneous breathing, nonlabored ventilation, respiratory function stable and patient connected to nasal cannula oxygen Cardiovascular status: blood pressure returned to baseline and stable Postop Assessment: no signs of nausea or vomiting Anesthetic complications: no     Last Vitals:  Vitals:   11/03/16 0959 11/03/16 1050  BP: 123/78 122/76  Pulse: 77 85  Resp: 18 18  Temp: 36.4 C     Last Pain:  Vitals:   11/03/16 0959  TempSrc: Temporal                 Precious Haws Gianah Batt

## 2016-11-03 NOTE — Anesthesia Procedure Notes (Signed)
Procedure Name: Intubation Date/Time: 11/03/2016 7:31 AM Performed by: Doreen Salvage Pre-anesthesia Checklist: Patient identified, Patient being monitored, Timeout performed, Emergency Drugs available and Suction available Patient Re-evaluated:Patient Re-evaluated prior to inductionOxygen Delivery Method: Circle system utilized Preoxygenation: Pre-oxygenation with 100% oxygen Intubation Type: IV induction Ventilation: Mask ventilation without difficulty Laryngoscope Size: Mac, 3 and McGraph Grade View: Grade I Tube type: Oral Tube size: 7.5 mm Number of attempts: 1 Airway Equipment and Method: Stylet Placement Confirmation: ETT inserted through vocal cords under direct vision,  positive ETCO2 and breath sounds checked- equal and bilateral Secured at: 20 cm Tube secured with: Tape Dental Injury: Teeth and Oropharynx as per pre-operative assessment  Difficulty Due To: Difficult Airway- due to anterior larynx

## 2016-11-03 NOTE — Brief Op Note (Signed)
11/03/2016  8:48 AM  PATIENT:  Timothy Wang  66 y.o. male  PRE-OPERATIVE DIAGNOSIS:  VENTRAL HERNIA WITHOUT OBSTRUCTION  POST-OPERATIVE DIAGNOSIS:  ventral hernia  PROCEDURE:  Procedure(s): HERNIA REPAIR VENTRAL ADULT WITH MESH (N/A)  SURGEON:  Surgeon(s) and Role:    * Clayburn Pert, MD - Primary  PHYSICIAN ASSISTANT:   ASSISTANTS: PA Student   ANESTHESIA:   general  EBL:  No intake/output data recorded.  BLOOD ADMINISTERED:none  DRAINS: none   LOCAL MEDICATIONS USED:  OTHER lidocaine and Exparel  SPECIMEN:  No Specimen  DISPOSITION OF SPECIMEN:  N/A  COUNTS:  YES  TOURNIQUET:  * No tourniquets in log *  DICTATION: .Dragon Dictation  PLAN OF CARE: Discharge to home after PACU  PATIENT DISPOSITION:  PACU - hemodynamically stable.   Delay start of Pharmacological VTE agent (>24hrs) due to surgical blood loss or risk of bleeding: not applicable

## 2016-11-03 NOTE — Discharge Instructions (Signed)
Ventral Hernia Repair, Care After This sheet gives you information about how to care for yourself after your procedure. Your health care provider may also give you more specific instructions. If you have problems or questions, contact your health care provider. What can I expect after the procedure? After the procedure, it is common to have:  Pain, discomfort, or soreness. Follow these instructions at home: Incision care   Follow instructions from your health care provider about how to take care of your incision. Make sure you:  Wash your hands with soap and water before you change your bandage (dressing) or before you touch your abdomen. If soap and water are not available, use hand sanitizer.  Change your dressing as told by your health care provider.  Leave stitches (sutures), skin glue, or adhesive strips in place. These skin closures may need to stay in place for 2 weeks or longer. If adhesive strip edges start to loosen and curl up, you may trim the loose edges. Do not remove adhesive strips completely unless your health care provider tells you to do that.  Check your incision area every day for signs of infection. Check for:  Redness, swelling, or pain.  Fluid or blood.  Warmth.  Pus or a bad smell. Bathing   Do not take baths, swim, or use a hot tub until your health care provider approves. Ask your health care provider if you can take showers. You may only be allowed to take sponge baths for bathing. OK to shower in 24 hours.  Keep your bandage (dressing) dry until your health care provider says it can be removed. Activity   Do not lift anything that is heavier than 10 lb (4.5 kg) until your health care provider approves.  Do not drive or use heavy machinery while taking prescription pain medicine. Ask your health care provider when it is safe for you to drive or use heavy machinery.  Do not drive for 24 hours if you were given a medicine to help you relax (sedative)  during your procedure.  Rest as told by your health care provider. You may return to your normal activities when your health care provider approves. General instructions   Take over-the-counter and prescription medicines only as told by your health care provider.  To prevent or treat constipation while you are taking prescription pain medicine, your health care provider may recommend that you:  Take over-the-counter or prescription medicines.  Eat foods that are high in fiber, such as fresh fruits and vegetables, whole grains, and beans.  Limit foods that are high in fat and processed sugars, such as fried and sweet foods.  Drink enough fluid to keep your urine clear or pale yellow.  Hold a pillow over your abdomen when you cough or sneeze. This helps with pain.  Keep all follow-up visits as told by your health care provider. This is important. Contact a health care provider if:  You have:  A fever or chills.  Redness, swelling, or pain around your incision.  Fluid or blood coming from your incision.  Pus or a bad smell coming from your incision.  Pain that gets worse or does not get better with medicine.  Nausea or vomiting.  A cough.  Shortness of breath.  Your incision feels warm to the touch.  You have not had a bowel movement in three days.  You are not able to urinate. Get help right away if:  You have severe pain in your abdomen.  You have  persistent nausea and vomiting.  You have redness, warmth, or pain in your leg.  You have chest pain.  You have trouble breathing. Summary  After this procedure, it is common to have pain, discomfort, or soreness.  Follow instructions from your health care provider about how to take care of your incision.  Check your incision area every day for signs of infection. Report any signs of infection to your health care provider.  Keep all follow-up visits as told by your health care provider. This is important. This  information is not intended to replace advice given to you by your health care provider. Make sure you discuss any questions you have with your health care provider. Document Released: 07/03/2012 Document Revised: 03/08/2016 Document Reviewed: 03/08/2016 Elsevier Interactive Patient Education  2017 [employer]   1) The drugs that you were given will stay in your system until tomorrow so for the next 24 hours you should not:  A) Drive an automobile B) Make any legal decisions C) Drink any alcoholic beverage   2) You may resume regular meals tomorrow.  Today it is better to start with liquids and gradually work up to solid foods.  You may eat anything you prefer, but it is better to start with liquids, then soup and crackers, and gradually work up to solid foods.   3) Please notify your doctor immediately if you have any unusual bleeding, trouble breathing, redness and pain at the surgery site, drainage, fever, or pain not relieved by medication.    4) Additional Instructions:Splint abdominal incision with towel or pillow anytime you cough, sneeze or move.        Please contact your physician with any problems or Same Day Surgery at [phone], Monday through Friday 6 am to 4 pm, or [facility] number at [phone].

## 2016-11-03 NOTE — Anesthesia Post-op Follow-up Note (Cosign Needed)
Anesthesia QCDR form completed.        

## 2016-11-03 NOTE — Op Note (Signed)
   Pre-operative Diagnosis: Ventral Hernia  Post-operative Diagnosis: Ventral Hernia  Procedure performed: Open Ventral Hernia Repair with Mesh  Surgeon: Clayburn Pert   Assistants: PA Studnet  Anesthesia: General endotracheal anesthesia  ASA Class: 2  Surgeon: Clayburn Pert, MD FACS  Anesthesia: Gen. with endotracheal tube  Assistant:PA Studnet  Procedure Details  The patient was seen again in the Holding Room. The benefits, complications, treatment options, and expected outcomes were discussed with the patient. The risks of bleeding, infection, recurrence of symptoms, failure to resolve symptoms,  bowel injury, any of which could require further surgery were reviewed with the patient.   The patient was taken to Operating Room, identified as Timothy Wang and the procedure verified.  A Time Out was held and the above information confirmed.  Prior to the induction of general anesthesia, antibiotic prophylaxis was administered. VTE prophylaxis was in place. General endotracheal anesthesia was then administered and tolerated well. After the induction, the abdomen was prepped with Chloraprep and draped in the sterile fashion. The patient was positioned in the supine position.  The palpable hernia was again marked and localized with 1% lidocaine solution. A midline incision was made over this with a 15 blade scalpel and taken on the level of the hernia with electrocautery. The hernia sac was entered into and omentum was identified. The omentum was fully reduced into the abdomen. Some chronic attachments to the hernia sac were taken down with light cautery. The hernia sac itself was excised from the superficial tissues with electrocautery and removed from the field. With the omentum and hernia contents fully reduced and adequate space around it was cleared out with blunt dissection and electrocautery.  The fascial defect was then measured and found to be 4 x 1 cm. The decision was made to  close this hernia defect over a 6.4 round ventralex ST Hernia Patch mesh. The mesh was able to be easily placed into the hernia defect. The hernia defect and mesh were secured with interrupted 0 Ethibond sutures. The strap from the mesh was removed and the sutures were tied down both securing the mesh and closing hernia defect. Entire area was copiously irrigated and meticulous hemostasis was insured. Liposomal bupivacaine was then used to localize the entire operative field.  The wound was then closed in layers. A deep dermal 2-0 Vicryl followed by a superficial dermal 3-0 Vicryl in interrupted fashion. A running subcuticular 4-0 Monocryl was then used and the skin was sealed with Dermabond. The patient tolerated the procedure well. He was awoken from general anesthesia and transferred to PACU in good condition. There were no immediate, complications and all counts were correct at the end of the procedure.  Findings: 4cm Ventral Hernia   Estimated Blood Loss: 10 mL         Drains: None         Specimens: None          Complications: None                  Condition: Good   Clayburn Pert, MD, FACS

## 2016-11-03 NOTE — H&P (View-Only) (Signed)
Outpatient Surgical Follow Up  10/19/2016  Timothy Wang is an 66 y.o. male.   Chief Complaint  Patient presents with  . Follow-up    Ventral hernia (H&P)    HPI: 66 year old male returns to clinic today for an update of his H&P prior to a ventral hernia repair. As previously reported its been there for at least 10 years and they thinks it is increased in size since his last visit. Otherwise he denies any new changes. He continues to bulge out through the day and reduces on its own when he lays back. His primary concern continues to be cosmesis. It is not tender and does not cause any pain. He denies any fevers, chills, nausea, vomiting, chest pain, shortness of breath, diarrhea, constipation.  Past Medical History:  Diagnosis Date  . Allergic rhinitis due to pollen   . Depression   . H/O: Bell's palsy   . Hyperlipidemia   . Ventral hernia     Past Surgical History:  Procedure Laterality Date  . [place]  . TONSILLECTOMY  1960    Family History  Problem Relation Age of Onset  . Pancreatic cancer Mother   . Alcohol abuse Father   . Heart attack Father   . Thyroid disease Sister   . Depression Sister   . Cancer Sister     breast    Social History:  reports that he has quit smoking. He has never used smokeless tobacco. He reports that he drinks about 2.4 oz of alcohol per week . He reports that he does not use drugs.  Allergies: No Known Allergies  Medications reviewed.    ROS A multipoint review of systems was completed, all pertinent positives and negatives are documented through the history of present illness and remainder are negative.   BP (!) 180/105   Pulse 80   Temp 97.8 F (36.6 C) (Oral)   Ht 5\' 11"  (1.803 m)   Wt 105.7 kg (233 lb)   BMI 32.50 kg/m   Physical Exam CONSTITUTIONAL: No acute distress. EYES: Pupils are equal, round, and reactive to light, Sclera are non-icteric. EARS, NOSE, MOUTH AND THROAT: The oropharynx is clear. The  oral mucosa is pink and moist. Hearing is intact to voice. LYMPH NODES:  Lymph nodes in the neck are normal. RESPIRATORY:  Lungs are clear. There is normal respiratory effort, with equal breath sounds bilaterally, and without pathologic use of accessory muscles. CARDIOVASCULAR: Heart is regular without murmurs, gallops, or rubs. GI: The abdomen is soft, nontender, and nondistended. There is a visible and palpable midline ventral hernia that is easily reducible when the patient lies supine. There is no hepatosplenomegaly. There are normal bowel sounds in all quadrants. GU: Rectal deferred.   MUSCULOSKELETAL: Normal muscle strength and tone. No cyanosis or edema.   SKIN: Turgor is good and there are no pathologic skin lesions or ulcers. NEUROLOGIC: Motor and sensation is grossly normal. Cranial nerves are grossly intact. PSYCH:  Oriented to person, place and time. Affect is normal.    No results found for this or any previous visit (from the past 48 hour(s)). No results found.  Assessment/Plan:  1. Ventral hernia without obstruction or gangrene 66 year old male with reducible ventral hernia. Again described the treatment options of open versus laparoscopic in detail he decides to proceed with an open repair. He is currently scheduled for surgery on April 6. He was noted today to have elevated blood pressure. He has been elevated in last 3  times in clinic. He will be seeing his primary care provider prior to proceeding with surgery. Tentatively plan to continue with her current schedule however should his blood pressure continued be an issue he understands that his surgery may be delayed. All questions answered to his satisfaction.  A total of 25 minutes was spent on this encounter. 50% of a user counseling and coordination of care.   Clayburn Pert, MD FACS General Surgeon  10/19/2016,3:12 PM

## 2016-11-03 NOTE — Interval H&P Note (Signed)
History and Physical Interval Note:  11/03/2016 6:55 AM  Timothy Wang  has presented today for surgery, with the diagnosis of VENTRAL HERNIA WITHOUT OBSTRUCTION  The various methods of treatment have been discussed with the patient and family. After consideration of risks, benefits and other options for treatment, the patient has consented to  Procedure(s): HERNIA REPAIR VENTRAL ADULT WITH MESH (N/A) as a surgical intervention .  The patient's history has been reviewed, patient examined, no change in status, stable for surgery.  I have reviewed the patient's chart and labs.  Questions were answered to the patient's satisfaction.     Clayburn Pert

## 2016-11-03 NOTE — Transfer of Care (Signed)
Immediate Anesthesia Transfer of Care Note  Patient: Timothy Wang  Procedure(s) Performed: Procedure(s): HERNIA REPAIR VENTRAL ADULT WITH MESH (N/A)  Patient Location: PACU  Anesthesia Type:General  Level of Consciousness: sedated  Airway & Oxygen Therapy: Patient Spontanous Breathing and Patient connected to face mask oxygen  Post-op Assessment: Report given to RN and Post -op Vital signs reviewed and stable  Post vital signs: Reviewed and stable  Last Vitals:  Vitals:   11/03/16 0631 11/03/16 0901  BP: 118/87 (!) 150/99  Pulse: 92 81  Resp: 18 (!) 21  Temp: 36.5 C 54.5 C    Complications: No apparent anesthesia complications

## 2016-11-10 ENCOUNTER — Ambulatory Visit (INDEPENDENT_AMBULATORY_CARE_PROVIDER_SITE_OTHER): Payer: Medicare Other | Admitting: General Surgery

## 2016-11-10 ENCOUNTER — Encounter: Payer: Self-pay | Admitting: General Surgery

## 2016-11-10 VITALS — BP 136/95 | HR 94 | Temp 98.3°F | Wt 220.0 lb

## 2016-11-10 DIAGNOSIS — Z4889 Encounter for other specified surgical aftercare: Secondary | ICD-10-CM

## 2016-11-10 NOTE — Progress Notes (Signed)
Outpatient Surgical Follow Up  11/10/2016  Timothy Wang is an 66 y.o. male.   Chief Complaint  Patient presents with  . Routine Post Op    Ventral Hernia Repair  11/03/16 Dr. Adonis Huguenin    HPI: 67 year old male returns to clinic 1 week status post open ventral hernia repair. Patient reports doing very well. He is eating well and has not required any pain medication. He did have some postoperative constipation that is slowly resolving. He denies any fevers, chills, nausea, vomiting, chest pain, shortness of breath. He has been very happy with his surgical drainage.  Past Medical History:  Diagnosis Date  . Allergic rhinitis due to pollen   . Depression   . GERD (gastroesophageal reflux disease)   . H/O: Bell's palsy   . Hyperlipidemia   . Hypertension   . Ventral hernia     Past Surgical History:  Procedure Laterality Date  . COLONOSCOPY    . [place]  . KNEE SURGERY  2015  . TONSILLECTOMY  1960  . VENTRAL HERNIA REPAIR N/A 11/03/2016   Procedure: HERNIA REPAIR VENTRAL ADULT WITH MESH;  Surgeon: Clayburn Pert, MD;  Location: [facility] ORS;  Service: General;  Laterality: N/A;    Family History  Problem Relation Age of Onset  . Pancreatic cancer Mother   . Alcohol abuse Father   . Heart attack Father   . Thyroid disease Sister   . Depression Sister   . Cancer Sister     breast    Social History:  reports that he quit smoking about 14 years ago. His smoking use included Cigarettes. He has a 10.00 pack-year smoking history. He has never used smokeless tobacco. He reports that he drinks alcohol. He reports that he does not use drugs.  Allergies: No Known Allergies  Medications reviewed.    ROS A multipoint review of systems was completed, all pertinent positives and negatives are documented within the history of present illness and remainder are negative   BP (!) 136/95   Pulse 94   Temp 98.3 F (36.8 C) (Oral)   Wt 99.8 kg (220 lb)   BMI 29.84 kg/m    Physical Exam Gen.: No acute distress Chest: Clear to auscultation Heart: Regular rhythm Abdomen: Soft, nontender, nondistended. Well approximated midline incision with Dermabond still in place. There is a palpable seroma underneath the incision. No evidence of drainage, erythema, infection.    No results found for this or any previous visit (from the past 48 hour(s)). No results found.  Assessment/Plan:  1. Aftercare following surgery 66 year old male 1 week status post open ventral hernia repair. Doing very well. He has a postoperative seroma. Discussed that so long as it doesn't drain or get infected and should resolve on its own. He voiced understanding. He'll follow-up in clinic in 2 weeks for additional wound check.     Clayburn Pert, MD FACS General Surgeon  11/10/2016,11:47 AM

## 2016-11-10 NOTE — Patient Instructions (Signed)
Please call our office with any questions or concerns.  Please do not submerge in a tub, hot tub, or pool until incisions are completely sealed.  Use sun block to incision area over the next year if this area will be exposed to sun. This helps decrease scarring.  You may resume your normal activities on 12/15/2016. At that time- Listen to your body when lifting, if you have pain when lifting, stop and then try again in a few days. Pain after doing exercises or activities of daily living is normal as you get back in to your normal routine.  If you develop redness, drainage, or pain at incision sites- call our office immediately and speak with a nurse.

## 2016-11-23 ENCOUNTER — Encounter: Payer: Self-pay | Admitting: General Surgery

## 2016-11-23 ENCOUNTER — Ambulatory Visit (INDEPENDENT_AMBULATORY_CARE_PROVIDER_SITE_OTHER): Payer: Medicare Other | Admitting: Surgery

## 2016-11-23 VITALS — BP 145/89 | HR 81 | Temp 97.8°F | Ht 72.0 in | Wt 225.0 lb

## 2016-11-23 DIAGNOSIS — Z09 Encounter for follow-up examination after completed treatment for conditions other than malignant neoplasm: Secondary | ICD-10-CM

## 2016-11-23 NOTE — Progress Notes (Signed)
11/23/2016  HPI: Patient is s/p ventral hernia repair with mesh with Dr. Adonis Huguenin on 4/6.  He was seen in the office on 4/13 and had a post-op seroma.  He presents for further evaluation.  He reports his seroma has decreased in size considerably.  Denies any pain or bulging.  Denies any nausea or vomiting.  Reports good appetite and normal bowel movements.  Vital signs: BP (!) 145/89   Pulse 81   Temp 97.8 F (36.6 C)   Ht 6' (1.829 m)   Wt 102.1 kg (225 lb)   BMI 30.52 kg/m    Physical Exam: Constitutional: No acute distress Abdomen: soft, nondistended, nontender to palpation.  Seroma much smaller today.  No erythema or induration.  No evidence of infection.  No hernia recurrence.  Patient does have diastasis recti.  Assessment/Plan: 66 yo male s/p ventral hernia repair.  --No acute issues on today's visit and the patient continues to improve.  Reassured that the seroma will continue to decrease in size as it's getting reabsorbed.  No evidence of infection or hernia recurrence. --Patient still has one more week of no heavy lifting or pushing of more than 10-15 lbs.  He understands this and that afterwards, he can resume regular activities. --patient may follow up on an as needed basis.   Melvyn Neth, [place]

## 2016-11-23 NOTE — Patient Instructions (Signed)
Please call our office with any questions or concerns.  You may submerge in a tub, hot tub, or pool if you desire.  Use sun block to incision area over the next year if this area will be exposed to sun. This helps decrease scarring.  You may resume your normal activities on 12/15/2016. At that time- Listen to your body when lifting, if you have pain when lifting, stop and then try again in a few days. Pain after doing exercises or activities of daily living is normal as you get back in to your normal routine.  If you develop redness, drainage, or pain at incision sites- call our office immediately and speak with a nurse.

## 2016-11-27 ENCOUNTER — Telehealth: Payer: Self-pay

## 2016-11-27 NOTE — Telephone Encounter (Signed)
Pt has medicare and will ck with ins whether has better benefits getting pneumonia vaccine at [facility] or pharmacy. Pt will cb to either schedule nurse visit or have rx sent to pharmacy.

## 2016-12-29 ENCOUNTER — Other Ambulatory Visit: Payer: Self-pay | Admitting: Family Medicine

## 2017-05-24 ENCOUNTER — Ambulatory Visit (INDEPENDENT_AMBULATORY_CARE_PROVIDER_SITE_OTHER): Payer: Medicare Other | Admitting: Family Medicine

## 2017-05-24 VITALS — BP 116/80 | HR 71 | Temp 97.7°F | Ht 71.0 in | Wt 225.5 lb

## 2017-05-24 DIAGNOSIS — G5622 Lesion of ulnar nerve, left upper limb: Secondary | ICD-10-CM | POA: Diagnosis not present

## 2017-05-24 DIAGNOSIS — Z23 Encounter for immunization: Secondary | ICD-10-CM | POA: Diagnosis not present

## 2017-05-24 NOTE — Progress Notes (Signed)
Dr. Frederico Hamman T. Johnanna Bakke, MD, [facility] [place], [address] Phone: [phone] Fax: [phone]  05/24/2017  Patient: Timothy Wang, MRN: [id number], DOB: [birth year], 66 y.o.  Primary Physician:  Owens Loffler, MD   Chief Complaint  Patient presents with  . Numbness    Left Hand x months   Subjective:   Timothy Wang is a 65 y.o. very pleasant male patient who presents with the following:  L arm numbness: the patient presents with a multi-month history of left-sided elbow pain, tingling down his arm and some numbness and tingling in his fourth and fifth digits.  He notices it when this is worse when he was at work.  He does do a lot of computer work.  Some shoulder stiffness.   Resting on the desk will make it wworse.    Past Medical History, Surgical History, Social History, Family History, Problem List, Medications, and Allergies have been reviewed and updated if relevant.  Patient Active Problem List   Diagnosis Date Noted  . Tendon rupture, traumatic. quadriceps, left, subsequent encounter 06/11/2014  . Essential hypertension 08/04/2010  . Hyperlipidemia 05/28/2008  . Ventral hernia 05/28/2008  . FATIGUE 05/28/2008  . DEPRESSION 04/21/2008  . ALLERGIC RHINITIS 04/21/2008    Past Medical History:  Diagnosis Date  . Allergic rhinitis due to pollen   . Depression   . GERD (gastroesophageal reflux disease)   . H/O: Bell's palsy   . Hyperlipidemia   . Hypertension   . Ventral hernia     Past Surgical History:  Procedure Laterality Date  . COLONOSCOPY    . [place]  . KNEE SURGERY  2015  . TONSILLECTOMY  1960  . VENTRAL HERNIA REPAIR N/A 11/03/2016   Procedure: HERNIA REPAIR VENTRAL ADULT WITH MESH;  Surgeon: Clayburn Pert, MD;  Location: [facility] ORS;  Service: General;  Laterality: N/A;    Social History   Social History  . Marital status: Single    Spouse name: N/A  . Number  of children: N/A  . 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   Social History Main Topics  . Smoking status: Former Smoker    Packs/day: 1.00    Years: 10.00    Types: Cigarettes    Quit date: 10/27/2002  . Smokeless tobacco: Never Used     Comment: quit 2004  . Alcohol use Yes     Comment: Occassionally beer and wine  . Drug use: No  . Sexual activity: Not on file   Other Topics Concern  . Not on file   Social History Narrative  . No narrative on file    Family History  Problem Relation Age of Onset  . Pancreatic cancer Mother   . Alcohol abuse Father   . Heart attack Father   . Thyroid disease Sister   . Depression Sister   . Cancer Sister        breast    No Known Allergies  Medication list reviewed and updated in full in [place].  GEN: no acute illness or fever CV: No chest pain or shortness of breath MSK: detailed above Neuro: neurological signs are described above ROS O/w per HPI  Objective:   BP 116/80   Pulse 71   Temp 97.7 F (36.5 C) (Oral)   Ht 5\' 11"  (1.803 m)   Wt 225 lb 8 oz (102.3 kg)  BMI 31.45 kg/m    GEN: WDWN, NAD, Non-toxic, Alert & Oriented x 3 HEENT: Atraumatic, Normocephalic.  Ears and Nose: No external deformity. EXTR: No clubbing/cyanosis/edema NEURO: Normal gait.  PSYCH: Normally interactive. Conversant. Not depressed or anxious appearing.  Calm demeanor.   L elbow Ecchymosis or edema: neg ROM: full flexion, extension, pronation, supination Shoulder ROM: Full Flexion: 5/5 Extension: 5/5 Supination: 5/5  Pronation: 5/5 Wrist ext: 5/5 Wrist flexion: 5/5 No gross bony abnormality Varus and Valgus stress: stable ECRB tenderness: neg Medial epicondyle: NT Lateral epicondyle, resisted wrist extension from wrist full pronation and flexion: NT grip: 5/5  sensation intact Tinel's, Elbow: + Decreased sensation 4th and 5th digits   Radiology: No results found.  Assessment and  Plan:   Cubital tunnel syndrome on left  Need for prophylactic vaccination and inoculation against influenza - Plan: Flu Vaccine QUAD 36+ mos IM  Need for prophylactic vaccination against Streptococcus pneumoniae (pneumococcus) - Plan: Pneumococcal conjugate vaccine 13-valent  Cubital tunnel syndrome or ulnar neuropathy is an entrapment neuropathy of the ulnar nerve at the elbow. This often occurs from repetitive trauma to this area from resting the elbow during occupational situations. Sometimes sleeping in flexion at the elbow can induce this as well. I reviewed the relevant anatomy and gave the patient a handout on this condition, which reviews recommendations, basic nerve gliding and conservative care.   I also recommended either getting a ulnar neuropathy brace for nighttime or to wrap the elbow with towels and then to use tape to keep it in relative extension.  Recommended for daytime use to use a pad or an elbow pad, such as those used by collegiate wrestlers.    Follow-up: No Follow-up on file.   Patient Instructions  Ulnar neuropathy brace: [employer] (cubital tunnel)  Padding of the elbow, can use a pad or a wrestling type elbow pad.     Future Appointments Date Time Provider [place]  08/15/2017 10:30 AM Ovida Delagarza, Frederico Hamman, MD [facility]    Meds ordered this encounter  Medications  . naproxen sodium (ANAPROX) 220 MG tablet    Sig: Take 220 mg by mouth daily as needed.  Marland Kitchen ibuprofen (ADVIL,MOTRIN) 200 MG tablet    Sig: Take 200 mg by mouth daily as needed.   There are no discontinued medications. Orders Placed This Encounter  Procedures  . Flu Vaccine QUAD 36+ mos IM  . Pneumococcal conjugate vaccine 13-valent    Signed,  Davieon Stockham T. Akiel Fennell, MD   Allergies as of 05/24/2017   No Known Allergies     Medication List       Accurate as of 05/24/17 11:59 PM. Always use your most recent med list.          aspirin 325 MG EC tablet Take 650 mg by mouth  once a week.   famotidine 10 MG tablet Commonly known as:  PEPCID Take 10 mg by mouth daily as needed for indigestion.   fluticasone 50 MCG/ACT nasal spray Commonly known as:  FLONASE USE TWO SPRAY(S) IN EACH NOSTRIL ONCE DAILY AS NEEDED   ibuprofen 200 MG tablet Commonly known as:  ADVIL,MOTRIN Take 200 mg by mouth daily as needed.   lisinopril-hydrochlorothiazide 10-12.5 MG tablet Commonly known as:  PRINZIDE,ZESTORETIC TAKE 1 TABLET BY MOUTH ONCE DAILY   naproxen sodium 220 MG tablet Commonly known as:  ANAPROX Take 220 mg by mouth daily as needed.   venlafaxine XR 37.5 MG 24 hr capsule Commonly known as:  EFFEXOR-XR Take 1  capsule (37.5 mg total) by mouth daily with breakfast.

## 2017-05-24 NOTE — Patient Instructions (Signed)
Ulnar neuropathy brace: [employer] (cubital tunnel)  Padding of the elbow, can use a pad or a wrestling type elbow pad.

## 2017-05-27 ENCOUNTER — Encounter: Payer: Self-pay | Admitting: Family Medicine

## 2017-06-15 DIAGNOSIS — H2513 Age-related nuclear cataract, bilateral: Secondary | ICD-10-CM | POA: Diagnosis not present

## 2017-06-15 DIAGNOSIS — H353131 Nonexudative age-related macular degeneration, bilateral, early dry stage: Secondary | ICD-10-CM | POA: Diagnosis not present

## 2017-08-15 ENCOUNTER — Ambulatory Visit: Payer: Medicare Other | Admitting: Family Medicine

## 2017-09-05 ENCOUNTER — Ambulatory Visit (INDEPENDENT_AMBULATORY_CARE_PROVIDER_SITE_OTHER): Payer: Medicare Other | Admitting: Family Medicine

## 2017-09-05 ENCOUNTER — Encounter: Payer: Self-pay | Admitting: Family Medicine

## 2017-09-05 ENCOUNTER — Other Ambulatory Visit: Payer: Self-pay

## 2017-09-05 VITALS — BP 120/90 | HR 74 | Temp 97.6°F | Ht 70.0 in | Wt 222.5 lb

## 2017-09-05 DIAGNOSIS — E785 Hyperlipidemia, unspecified: Secondary | ICD-10-CM

## 2017-09-05 DIAGNOSIS — N400 Enlarged prostate without lower urinary tract symptoms: Secondary | ICD-10-CM | POA: Diagnosis not present

## 2017-09-05 DIAGNOSIS — Z79899 Other long term (current) drug therapy: Secondary | ICD-10-CM

## 2017-09-05 DIAGNOSIS — Z0000 Encounter for general adult medical examination without abnormal findings: Secondary | ICD-10-CM | POA: Diagnosis not present

## 2017-09-05 LAB — CBC WITH DIFFERENTIAL/PLATELET
Basophils Absolute: 0 10*3/uL (ref 0.0–0.1)
Basophils Relative: 1 % (ref 0.0–3.0)
Eosinophils Absolute: 0.1 10*3/uL (ref 0.0–0.7)
Eosinophils Relative: 1.9 % (ref 0.0–5.0)
HEMATOCRIT: 48.2 % (ref 39.0–52.0)
HEMOGLOBIN: 16 g/dL (ref 13.0–17.0)
LYMPHS PCT: 19.7 % (ref 12.0–46.0)
Lymphs Abs: 0.9 10*3/uL (ref 0.7–4.0)
MCHC: 33.2 g/dL (ref 30.0–36.0)
MCV: 90.2 fl (ref 78.0–100.0)
MONO ABS: 0.5 10*3/uL (ref 0.1–1.0)
Monocytes Relative: 10.7 % (ref 3.0–12.0)
Neutro Abs: 3 10*3/uL (ref 1.4–7.7)
Neutrophils Relative %: 66.7 % (ref 43.0–77.0)
Platelets: 253 10*3/uL (ref 150.0–400.0)
RBC: 5.34 Mil/uL (ref 4.22–5.81)
RDW: 13.8 % (ref 11.5–15.5)
WBC: 4.5 10*3/uL (ref 4.0–10.5)

## 2017-09-05 LAB — HEPATIC FUNCTION PANEL
ALT: 17 U/L (ref 0–53)
AST: 15 U/L (ref 0–37)
Albumin: 4 g/dL (ref 3.5–5.2)
Alkaline Phosphatase: 93 U/L (ref 39–117)
BILIRUBIN DIRECT: 0.1 mg/dL (ref 0.0–0.3)
BILIRUBIN TOTAL: 0.6 mg/dL (ref 0.2–1.2)
TOTAL PROTEIN: 6.6 g/dL (ref 6.0–8.3)

## 2017-09-05 LAB — BASIC METABOLIC PANEL
BUN: 15 mg/dL (ref 6–23)
CHLORIDE: 103 meq/L (ref 96–112)
CO2: 35 mEq/L — ABNORMAL HIGH (ref 19–32)
Calcium: 9 mg/dL (ref 8.4–10.5)
Creatinine, Ser: 0.76 mg/dL (ref 0.40–1.50)
GFR: 109.1 mL/min (ref >60.00)
Glucose, Bld: 92 mg/dL (ref 70–99)
Potassium: 4.8 mEq/L (ref 3.5–5.1)
SODIUM: 141 meq/L (ref 135–145)

## 2017-09-05 LAB — LIPID PANEL
CHOL/HDL RATIO: 5
Cholesterol: 179 mg/dL (ref 0–200)
HDL: 37.5 mg/dL — ABNORMAL LOW (ref >39.00)
LDL CALC: 129 mg/dL — AB (ref 0–99)
NONHDL: 141.46
TRIGLYCERIDES: 60 mg/dL (ref 0.0–149.0)
VLDL: 12 mg/dL (ref 0.0–40.0)

## 2017-09-05 LAB — PSA: PSA: 0.7 ng/mL (ref 0.10–4.00)

## 2017-09-05 NOTE — Progress Notes (Signed)
Dr. Frederico Hamman T. Leandro Berkowitz, MD, [facility] [place], [address] Phone: [phone] Fax: [phone]  09/05/2017  Patient: Timothy Wang, MRN: [id number], DOB: [birth year], 67 y.o.  Primary Physician:  Owens Loffler, MD   Chief Complaint  Patient presents with  . Welcome to Medicare   Subjective:   Timothy Wang is a 67 y.o. pleasant patient who presents for a Welcome to Medicare examination:  Preventative Health Maintenance Visit:  Health Maintenance Summary Reviewed and updated, unless pt declines services.  Tobacco History Reviewed. Alcohol: No concerns, no excessive use Exercise Habits: minimal activity, rec at least 30 mins 5 times a week STD concerns: no risk or activity to increase risk Drug Use: None Encouraged self-testicular check  1 of his coworkers that he knew well for many years recently committed suicide, and he is having some difficulty with that and he misses this gentleman who was a friend at work and outside of work.  Is also contemplating retiring.  Health Maintenance  Topic Date Due  . PNA vac Low Risk Adult (2 of 2 - PPSV23) 05/24/2018  . COLONOSCOPY  05/31/2022  . TETANUS/TDAP  02/18/2023  . INFLUENZA VACCINE  Completed  . Hepatitis C Screening  Completed  . HIV Screening  Completed    Immunization History  Administered Date(s) Administered  . Influenza Whole 04/10/2011  . Influenza,inj,Quad PF,6+ Mos 04/30/2014, 06/05/2016, 05/24/2017  . Pneumococcal Conjugate-13 05/24/2017  . Tdap 02/17/2013  . Zoster 09/10/2014    Patient Active Problem List   Diagnosis Date Noted  . Tendon rupture, traumatic. quadriceps, left, subsequent encounter 06/11/2014  . Essential hypertension 08/04/2010  . Hyperlipidemia 05/28/2008  . Ventral hernia 05/28/2008  . FATIGUE 05/28/2008  . DEPRESSION 04/21/2008  . ALLERGIC RHINITIS 04/21/2008   Past Medical History:  Diagnosis Date  . Allergic  rhinitis due to pollen   . Depression   . GERD (gastroesophageal reflux disease)   . H/O: Bell's palsy   . Hyperlipidemia   . Hypertension   . Ventral hernia    Past Surgical History:  Procedure Laterality Date  . COLONOSCOPY    . [place]  . KNEE SURGERY  2015  . TONSILLECTOMY  1960  . VENTRAL HERNIA REPAIR N/A 11/03/2016   Procedure: HERNIA REPAIR VENTRAL ADULT WITH MESH;  Surgeon: Clayburn Pert, MD;  Location: [facility] ORS;  Service: General;  Laterality: N/A;   Social History   Socioeconomic History  . Marital status: Single    Spouse name: Not on file  . Number of children: Not on file  . Years of education: Not on file  . Highest education level: Not on file  Social Needs  . Financial resource strain: Not on file  . Food insecurity - worry: Not on file  . Food insecurity - inability: Not on file  . Transportation needs - medical: Not on file  . Transportation needs - non-medical: Not on file  Occupational History  . Occupation: Scientist, clinical (histocompatibility and immunogenetics): [employer]  Tobacco Use  . Smoking status: Former Smoker    Packs/day: 1.00    Years: 10.00    Pack years: 10.00    Types: Cigarettes    Last attempt to quit: 10/27/2002    Years since quitting: 14.8  . Smokeless tobacco: Never Used  . Tobacco comment: quit 2004  Substance and Sexual Activity  . Alcohol use: Yes    Comment: Occassionally beer and wine  .  Drug use: No  . Sexual activity: Not on file  Other Topics Concern  . Not on file  Social History Narrative  . Not on file   Family History  Problem Relation Age of Onset  . Pancreatic cancer Mother   . Alcohol abuse Father   . Heart attack Father   . Thyroid disease Sister   . Depression Sister   . Cancer Sister        breast   No Known Allergies  Medication list has been reviewed and updated.   General: Denies fever, chills, sweats. No significant weight loss. Eyes: Denies blurring,significant itching ENT: Denies earache, sore throat,  and hoarseness. Cardiovascular: Denies chest pains, palpitations, dyspnea on exertion Respiratory: Denies cough, dyspnea at rest,wheeezing Breast: no concerns about lumps GI: Denies nausea, vomiting, diarrhea, constipation, change in bowel habits, abdominal pain, melena, hematochezia GU: Denies penile discharge, ED, urinary flow / outflow problems. No STD concerns. Musculoskeletal: Denies back pain, joint pain Derm: Denies rash, itching Neuro: Denies  paresthesias, frequent falls, frequent headaches Psych: Denies depression, anxiety Endocrine: Denies cold intolerance, heat intolerance, polydipsia Heme: Denies enlarged lymph nodes Allergy: No hayfever  Objective:   BP 120/90   Pulse 74   Temp 97.6 F (36.4 C) (Oral)   Ht '5\' 10"'  (1.778 m)   Wt 222 lb 8 oz (100.9 kg)   BMI 31.93 kg/m   The patient completed a fall screen and PHQ-2 and PHQ-9 if necessary, which is documented in the EHR. The CMA/LPN/RN who assisted the patient verbally completed with them and documented results in [place].   Hearing Screening   Method: Audiometry   '125Hz'  '250Hz'  '500Hz'  '1000Hz'  '2000Hz'  '3000Hz'  '4000Hz'  '6000Hz'  '8000Hz'   Right ear:   20 0 20  20    Left ear:   20 40 20  20    Vision Screening Comments: Wears Glasses-[facility] Ex[facility] with Dr. Sandra Cockayne in 05/2017  GEN: well developed, well nourished, no acute distress Eyes: conjunctiva and lids normal, PERRLA, EOMI ENT: TM clear, nares clear, oral exam WNL Neck: supple, no lymphadenopathy, no thyromegaly, no JVD Pulm: clear to auscultation and percussion, respiratory effort normal CV: regular rate and rhythm, S1-S2, no murmur, rub or gallop, no bruits, peripheral pulses normal and symmetric, no cyanosis, clubbing, edema or varicosities GI: soft, non-tender; no hepatosplenomegaly, masses; active bowel sounds all quadrants GU: no hernia, testicular mass, penile discharge Lymph: no cervical, axillary or inguinal adenopathy MSK: gait normal,  muscle tone and strength WNL, no joint swelling, effusions, discoloration, crepitus  SKIN: clear, good turgor, color WNL, no rashes, lesions, or ulcerations Neuro: normal mental status, normal strength, sensation, and motion Psych: alert; oriented to person, place and time, normally interactive and not anxious or depressed in appearance.  All labs reviewed with patient.  Lipids:    Component Value Date/Time   CHOL 179 09/05/2017 1000   TRIG 60.0 09/05/2017 1000   HDL 37.50 (L) 09/05/2017 1000   LDLDIRECT 172.7 04/20/2010 1006   VLDL 12.0 09/05/2017 1000   CHOLHDL 5 09/05/2017 1000   CBC: CBC Latest Ref Rng & Units 09/05/2017 10/30/2016 05/29/2016  WBC 4.0 - 10.5 K/uL 4.5 4.1 5.0  Hemoglobin 13.0 - 17.0 g/dL 16.0 16.0 16.2  Hematocrit 39.0 - 52.0 % 48.2 47.3 47.8  Platelets 150.0 - 400.0 K/uL 253.0 252.0 024.0    Basic Metabolic Panel:    Component Value Date/Time   NA 141 09/05/2017 1000   K 4.8 09/05/2017 1000  CL 103 09/05/2017 1000   CO2 35 (H) 09/05/2017 1000   BUN 15 09/05/2017 1000   CREATININE 0.76 09/05/2017 1000   GLUCOSE 92 09/05/2017 1000   CALCIUM 9.0 09/05/2017 1000   Hepatic Function Latest Ref Rng & Units 09/05/2017 10/30/2016 05/29/2016  Total Protein 6.0 - 8.3 g/dL 6.6 7.1 7.3  Albumin 3.5 - 5.2 g/dL 4.0 4.2 4.3  AST 0 - 37 U/L '15 17 18  ' ALT 0 - 53 U/L '17 16 17  ' Alk Phosphatase 39 - 117 U/L 93 100 106  Total Bilirubin 0.2 - 1.2 mg/dL 0.6 0.7 0.6  Bilirubin, Direct 0.0 - 0.3 mg/dL 0.1 0.1 -    Lab Results  Component Value Date   TSH 2.84 05/29/2016   Lab Results  Component Value Date   PSA 0.70 09/05/2017   PSA 0.49 04/30/2014   PSA 0.55 02/13/2013    Assessment and Plan:   Healthcare maintenance  Hyperlipidemia LDL goal <100 - Plan: Lipid panel  Benign prostatic hyperplasia, unspecified whether lower urinary tract symptoms present - Plan: PSA  Encounter for long-term (current) use of medications - Plan: Basic metabolic panel, CBC with  Differential/Platelet, Hepatic function panel  Health Maintenance Exam: The patient's preventative maintenance and recommended screening tests for an annual wellness exam were reviewed in full today. Brought up to date unless services declined.  Counselled on the importance of diet, exercise, and its role in overall health and mortality. The patient's FH and SH was reviewed, including their home life, tobacco status, and drug and alcohol status.  Follow-up in 1 year for physical exam or additional follow-up below.  I have personally reviewed the Medicare Annual Wellness questionnaire and have noted 1. The patient's medical and social history 2. Their use of alcohol, tobacco or illicit drugs 3. Their current medications and supplements 4. The patient's functional ability including ADL's, fall risks, home safety risks and hearing or visual             impairment. 5. Diet and physical activities 6. Evidence for depression or mood disorders 7. Reviewed Updated provider list, see scanned forms and [facility] Snapshot.   The patients weight, height, BMI and visual acuity have been recorded in the chart I have made referrals, counseling and provided education to the patient based review of the above and I have provided the pt with a written personalized care plan for preventive services.  I have provided the patient with a copy of your personalized plan for preventive services. Instructed to take the time to review along with their updated medication list.  Follow-up: No Follow-up on file. Or follow-up in 1 year if not noted.  Medications Discontinued During This Encounter  Medication Reason  . lisinopril-hydrochlorothiazide (PRINZIDE,ZESTORETIC) 10-12.5 MG tablet    Orders Placed This Encounter  Procedures  . Lipid panel  . Basic metabolic panel  . CBC with Differential/Platelet  . Hepatic function panel  . PSA    Signed,  Frederico Hamman T. Persis Graffius, MD   Allergies as of 09/05/2017   No Known  Allergies     Medication List        Accurate as of 09/05/17 11:59 PM. Always use your most recent med list.          aspirin 325 MG EC tablet Take 650 mg by mouth once a week.   famotidine 10 MG tablet Commonly known as:  PEPCID Take 10 mg by mouth daily as needed for indigestion.   fluticasone 50 MCG/ACT nasal spray  Commonly known as:  FLONASE USE TWO SPRAY(S) IN EACH NOSTRIL ONCE DAILY AS NEEDED   ibuprofen 200 MG tablet Commonly known as:  ADVIL,MOTRIN Take 200 mg by mouth daily as needed.   naproxen sodium 220 MG tablet Commonly known as:  ALEVE Take 220 mg by mouth daily as needed.   venlafaxine XR 37.5 MG 24 hr capsule Commonly known as:  EFFEXOR-XR Take 1 capsule (37.5 mg total) by mouth daily with breakfast.

## 2017-09-25 ENCOUNTER — Ambulatory Visit: Payer: Self-pay

## 2017-09-25 NOTE — Telephone Encounter (Signed)
Called with c/o generalized rash on hips, inner thighs, arms, axilla, chest, and stomach, over the past 2 weeks.  Reported the rash has the appearance of "small red splotches and patches."  Today, denies any facial symptoms, but reported had an episode last week with facial and lip swelling. Stated the lip swelled like he had been stung by a bee.  Reported the onset of rash correlated with changing his laundry detergent to Tide with Oxyclean, approx. 2 weeks ago.  Reported the only other change is using Venlafaxine and Aleve prn for shoulder pain. Stated has episodes with intense itching; taking Benadryl OTC.  Appt. Given for 2/27 @ 9:00 AM.  Care advice given per protocol. Verb. Understanding .       Reason for Disposition . SEVERE itching (i.e., interferes with sleep, normal activities or school)  Answer Assessment - Initial Assessment Questions 1. APPEARANCE of RASH: "Describe the rash." (e.g., spots, blisters, raised areas, skin peeling, scaly)    Red patches on hips, inner thighs, arms and axilla; stomach and chest last night 2. SIZE: "How big are the spots?" (e.g., tip of pen, eraser, coin; inches, centimeters)     Small splotches, red patches vary in size 3. LOCATION: "Where is the rash located?"     Hips, inner thigh, arms, and axilla 4. COLOR: "What color is the rash?" (Note: It is difficult to assess rash color in people with darker-colored skin. When this situation occurs, simply ask the caller to describe what they see.)     Red 5. ONSET: "When did the rash begin?"     Within the last 2 week 6. FEVER: "Do you have a fever?" If so, ask: "What is your temperature, how was it measured, and when did it start?"     No  7. ITCHING: "Does the rash itch?" If so, ask: "How bad is the itch?" (Scale 1-10; or mild, moderate, severe)     Intermittent itching @ 7-8/10 8. CAUSE: "What do you think is causing the rash?"     Unknown; has been using Tide with Oxyclean 9. MEDICATION FACTORS: "Have you  started any new medications within the last 2 weeks?" (e.g., antibiotics)     Takes Aleve prn; took 2 tablets this weekend 10. OTHER SYMPTOMS: "Do you have any other symptoms?" (e.g., dizziness, headache, sore throat, joint pain)       Reported one short episode of facial and lip swelling one week ago; itching    11. PREGNANCY: "Is there any chance you are pregnant?" "When was your last menstrual period?"       n/a  Protocols used: RASH OR REDNESS - [facility]

## 2017-09-26 ENCOUNTER — Encounter: Payer: Self-pay | Admitting: Family Medicine

## 2017-09-26 ENCOUNTER — Ambulatory Visit (INDEPENDENT_AMBULATORY_CARE_PROVIDER_SITE_OTHER): Payer: Medicare Other | Admitting: Family Medicine

## 2017-09-26 VITALS — BP 128/72 | HR 79 | Temp 98.4°F | Wt 223.8 lb

## 2017-09-26 DIAGNOSIS — L240 Irritant contact dermatitis due to detergents: Secondary | ICD-10-CM | POA: Diagnosis not present

## 2017-09-26 NOTE — Patient Instructions (Signed)
Take a daily dose of cetirizine (generic Zyrtec) and Tagamet (generic is fine) for 7-10 days  If not better in a couple of days, let me know and we can do a course of prednisone   Hives Hives (urticaria) are itchy, red, swollen areas on your skin. Hives can appear on any part of your body and can vary in size. They can be as small as the tip of a pen or much larger. Hives often fade within 24 hours (acute hives). In other cases, new hives appear after old ones fade. This cycle can continue for several days or weeks (chronic hives). Hives result from your body's reaction to an irritant or to something that you are allergic to (trigger). When you are exposed to a trigger, your body releases a chemical (histamine) that causes redness, itching, and swelling. You can get hives immediately after being exposed to a trigger or hours later. Hives do not spread from person to person (are not contagious). Your hives may get worse with scratching, exercise, and emotional stress. What are the causes? Causes of this condition include:  Allergies to certain foods or ingredients.  Insect bites or stings.  Exposure to pollen or pet dander.  Contact with latex or chemicals.  Spending time in sunlight, heat, or cold (exposure).  Exercise.  Stress.  You can also get hives from some medical conditions and treatments. These include:  Viruses, including the common cold.  Bacterial infections, such as urinary tract infections and strep throat.  Disorders such as vasculitis, lupus, or thyroid disease.  Certain medications.  Allergy shots.  Blood transfusions.  Sometimes, the cause of hives is not known (idiopathic hives). What increases the risk? This condition is more likely to develop in:  Women.  People who have food allergies, especially to citrus fruits, milk, eggs, peanuts, tree nuts, or shellfish.  People who are allergic  to: ? Medicines. ? Latex. ? Insects. ? Animals. ? Pollen.  People who have certain medical conditions, includinglupus or thyroid disease.  What are the signs or symptoms? The main symptom of this condition is raised, itchyred or white bumps or patches on your skin. These areas may:  Become large and swollen (welts).  Change in shape and location, quickly and repeatedly.  Be separate hives or connect over a large area of skin.  Sting or become painful.  Turn white when pressed in the center (blanch).  In severe cases, yourhands, feet, and face may also become swollen. This may occur if hives develop deeper in your skin. How is this diagnosed? This condition is diagnosed based on your symptoms, medical history, and physical exam. Your skin, urine, or blood may be tested to find out what is causing your hives and to rule out other health issues. Your health care provider may also remove a small sample of skin from the affected area and examine it under a microscope (biopsy). How is this treated? Treatment depends on the severity of your condition. Your health care provider may recommend using cool, wet cloths (cool compresses) or taking cool showers to relieve itching. Hives are sometimes treated with medicines, including:  Antihistamines.  Corticosteroids.  Antibiotics.  An injectable medicine (omalizumab). Your health care provider may prescribe this if you have chronic idiopathic hives and you continue to have symptoms even after treatment with antihistamines.  Severe cases may require an emergency injection of adrenaline (epinephrine) to prevent a life-threatening allergic reaction (anaphylaxis). Follow these instructions at home: Medicines  Take or apply over-the-counter  and prescription medicines only as told by your health care provider.  If you were prescribed an antibiotic medicine, use it as told by your health care provider. Do not stop taking the antibiotic even  if you start to feel better. Skin Care  Apply cool compresses to the affected areas.  Do not scratch or rub your skin. General instructions  Do not take hot showers or baths. This can make itching worse.  Do not wear tight-fitting clothing.  Use sunscreen and wear protective clothing when you are outside.  Avoid any substances that cause your hives. Keep a journal to help you track what causes your hives. Write down: ? What medicines you take. ? What you eat and drink. ? What products you use on your skin.  Keep all follow-up visits as told by your health care provider. This is important. Contact a health care provider if:  Your symptoms are not controlled with medicine.  Your joints are painful or swollen. Get help right away if:  You have a fever.  You have pain in your abdomen.  Your tongue or lips are swollen.  Your eyelids are swollen.  Your chest or throat feels tight.  You have trouble breathing or swallowing. These symptoms may represent a serious problem that is an emergency. Do not wait to see if the symptoms will go away. Get medical help right away. Call your local emergency services (911 in the [place] Do not drive yourself to the hospital. This information is not intended to replace advice given to you by your health care provider. Make sure you discuss any questions you have with your health care provider. Document Released: 07/17/2005 Document Revised: 12/15/2015 Document Reviewed: 05/05/2015 Elsevier Interactive Patient Education  2018 [employer].

## 2017-09-26 NOTE — Progress Notes (Signed)
   Subjective:    Patient ID: Timothy Wang, male    DOB: 1951/03/07, 67 y.o.   MRN: [id number]  HPI This is a 67 yo male who presents today with rash x 1.5 weeks. Rash is worse at some times, on legs, trunk, itchy. Changed laundry detergent recently. Has taken some diphenhydramine and used cortisone cream. Switched to Tide with Oxy clean. Has switched to a free and clear detergent and has washed everything.  Has history of plant allergies many years ago, had allergy shots, resolved.   Past Medical History:  Diagnosis Date  . Allergic rhinitis due to pollen   . Depression   . GERD (gastroesophageal reflux disease)   . H/O: Bell's palsy   . Hyperlipidemia   . Hypertension   . Ventral hernia    Past Surgical History:  Procedure Laterality Date  . COLONOSCOPY    . [place]  . KNEE SURGERY  2015  . TONSILLECTOMY  1960  . VENTRAL HERNIA REPAIR N/A 11/03/2016   Procedure: HERNIA REPAIR VENTRAL ADULT WITH MESH;  Surgeon: Clayburn Pert, MD;  Location: [facility] ORS;  Service: General;  Laterality: N/A;   Family History  Problem Relation Age of Onset  . Pancreatic cancer Mother   . Alcohol abuse Father   . Heart attack Father   . Thyroid disease Sister   . Depression Sister   . Cancer Sister        breast   Social History   Tobacco Use  . Smoking status: Former Smoker    Packs/day: 1.00    Years: 10.00    Pack years: 10.00    Types: Cigarettes    Last attempt to quit: 10/27/2002    Years since quitting: 14.9  . Smokeless tobacco: Never Used  . Tobacco comment: quit 2004  Substance Use Topics  . Alcohol use: Yes    Comment: Occassionally beer and wine  . Drug use: No      Review of Systems Per HPI    Objective:   Physical Exam  Constitutional: He is oriented to person, place, and time. He appears well-developed and well-nourished.  HENT:  Head: Normocephalic and atraumatic.  Eyes: Conjunctivae are normal.  Cardiovascular: Normal rate.  Pulmonary/Chest:  Effort normal.  Neurological: He is alert and oriented to person, place, and time.  Skin: Skin is warm and dry. Rash noted.  Scattered erythematous areas on neck, lateral trunk, forearms.    Psychiatric: He has a normal mood and affect. His behavior is normal. Judgment and thought content normal.  Vitals reviewed.    BP 128/72   Pulse 79   Temp 98.4 F (36.9 C) (Oral)   Wt 223 lb 12 oz (101.5 kg)   SpO2 98%   BMI 32.10 kg/m       Assessment & Plan:  1. Irritant contact dermatitis due to detergent - Provided written and verbal information regarding diagnosis and treatment. - encouraged him to take cetirizine and Tagamet for 7-10 days, discussed possible prednisone course, but he declines at this time- he will let me know if symptoms get worse.  - RTC precautions reveiwed    Clarene Reamer, FNP-BC  [facility], [place]  09/26/2017 9:16 AM

## 2017-12-12 DIAGNOSIS — H35413 Lattice degeneration of retina, bilateral: Secondary | ICD-10-CM | POA: Diagnosis not present

## 2018-02-27 DIAGNOSIS — J301 Allergic rhinitis due to pollen: Secondary | ICD-10-CM | POA: Diagnosis not present

## 2018-02-27 DIAGNOSIS — H6123 Impacted cerumen, bilateral: Secondary | ICD-10-CM | POA: Diagnosis not present

## 2018-06-10 DIAGNOSIS — H353131 Nonexudative age-related macular degeneration, bilateral, early dry stage: Secondary | ICD-10-CM | POA: Diagnosis not present

## 2018-07-31 DIAGNOSIS — H269 Unspecified cataract: Secondary | ICD-10-CM

## 2018-07-31 HISTORY — DX: Unspecified cataract: H26.9

## 2018-09-23 ENCOUNTER — Telehealth: Payer: Self-pay | Admitting: Family Medicine

## 2018-09-23 NOTE — Telephone Encounter (Signed)
Called to schedule pt for annual wellness visit.

## 2018-10-04 ENCOUNTER — Telehealth: Payer: Self-pay | Admitting: Family Medicine

## 2018-10-04 DIAGNOSIS — I10 Essential (primary) hypertension: Secondary | ICD-10-CM

## 2018-10-04 DIAGNOSIS — Z79899 Other long term (current) drug therapy: Secondary | ICD-10-CM

## 2018-10-04 DIAGNOSIS — Z131 Encounter for screening for diabetes mellitus: Secondary | ICD-10-CM

## 2018-10-04 DIAGNOSIS — N400 Enlarged prostate without lower urinary tract symptoms: Secondary | ICD-10-CM

## 2018-10-04 DIAGNOSIS — E785 Hyperlipidemia, unspecified: Secondary | ICD-10-CM

## 2018-10-04 DIAGNOSIS — R739 Hyperglycemia, unspecified: Secondary | ICD-10-CM

## 2018-10-04 NOTE — Telephone Encounter (Signed)
Timothy Wang notified by telephone that he did have the Prevnar 13 on 05/24/2017 so he due for a Pneumovax 23.  He is scheduled for his Timothy Wang with [name] on 10/09/2018.  I advised Timothy Wang will given him the vaccine at that appointment.

## 2018-10-04 NOTE — Telephone Encounter (Signed)
Pt need to know if he has taken the 1st pneumonia vaccine. Please call pt to advise.

## 2018-10-06 NOTE — Progress Notes (Signed)
This encounter was created in error - please disregard.

## 2018-10-07 NOTE — Addendum Note (Signed)
Addended by: Ellamae Sia on: 10/07/2018 09:43 AM   Modules accepted: Orders

## 2018-10-07 NOTE — Addendum Note (Signed)
Addended by: Ellamae Sia on: 10/07/2018 09:38 AM   Modules accepted: Orders

## 2018-10-09 ENCOUNTER — Other Ambulatory Visit: Payer: Self-pay

## 2018-10-09 ENCOUNTER — Ambulatory Visit (INDEPENDENT_AMBULATORY_CARE_PROVIDER_SITE_OTHER): Payer: Medicare Other

## 2018-10-09 VITALS — BP 102/82 | HR 83 | Temp 98.1°F | Ht 69.5 in | Wt 222.6 lb

## 2018-10-09 DIAGNOSIS — Z0000 Encounter for general adult medical examination without abnormal findings: Secondary | ICD-10-CM

## 2018-10-09 DIAGNOSIS — Z23 Encounter for immunization: Secondary | ICD-10-CM | POA: Diagnosis not present

## 2018-10-09 DIAGNOSIS — R739 Hyperglycemia, unspecified: Secondary | ICD-10-CM | POA: Diagnosis not present

## 2018-10-09 DIAGNOSIS — Z79899 Other long term (current) drug therapy: Secondary | ICD-10-CM

## 2018-10-09 DIAGNOSIS — E785 Hyperlipidemia, unspecified: Secondary | ICD-10-CM

## 2018-10-09 DIAGNOSIS — N400 Enlarged prostate without lower urinary tract symptoms: Secondary | ICD-10-CM | POA: Diagnosis not present

## 2018-10-09 LAB — BASIC METABOLIC PANEL
BUN: 16 mg/dL (ref 6–23)
CHLORIDE: 103 meq/L (ref 96–112)
CO2: 32 mEq/L (ref 19–32)
Calcium: 9 mg/dL (ref 8.4–10.5)
Creatinine, Ser: 0.79 mg/dL (ref 0.40–1.50)
GFR: 97.83 mL/min (ref >60.00)
GLUCOSE: 87 mg/dL (ref 70–99)
POTASSIUM: 4.6 meq/L (ref 3.5–5.1)
Sodium: 139 mEq/L (ref 135–145)

## 2018-10-09 LAB — LIPID PANEL
CHOL/HDL RATIO: 4
CHOLESTEROL: 181 mg/dL (ref 0–200)
HDL: 43.3 mg/dL (ref >39.00)
LDL CALC: 127 mg/dL — AB (ref 0–99)
NonHDL: 138.04
TRIGLYCERIDES: 57 mg/dL (ref 0.0–149.0)
VLDL: 11.4 mg/dL (ref 0.0–40.0)

## 2018-10-09 LAB — CBC WITH DIFFERENTIAL/PLATELET
Basophils Absolute: 0 10*3/uL (ref 0.0–0.1)
Basophils Relative: 1 % (ref 0.0–3.0)
Eosinophils Absolute: 0.1 10*3/uL (ref 0.0–0.7)
Eosinophils Relative: 1.5 % (ref 0.0–5.0)
HCT: 46.7 % (ref 39.0–52.0)
HEMOGLOBIN: 15.7 g/dL (ref 13.0–17.0)
LYMPHS ABS: 0.8 10*3/uL (ref 0.7–4.0)
Lymphocytes Relative: 18.5 % (ref 12.0–46.0)
MCHC: 33.6 g/dL (ref 30.0–36.0)
MCV: 91.1 fl (ref 78.0–100.0)
MONO ABS: 0.4 10*3/uL (ref 0.1–1.0)
Monocytes Relative: 10.2 % (ref 3.0–12.0)
NEUTROS PCT: 68.8 % (ref 43.0–77.0)
Neutro Abs: 2.9 10*3/uL (ref 1.4–7.7)
Platelets: 241 10*3/uL (ref 150.0–400.0)
RBC: 5.12 Mil/uL (ref 4.22–5.81)
RDW: 14 % (ref 11.5–15.5)
WBC: 4.2 10*3/uL (ref 4.0–10.5)

## 2018-10-09 LAB — HEPATIC FUNCTION PANEL
ALT: 13 U/L (ref 0–53)
AST: 15 U/L (ref 0–37)
Albumin: 4.1 g/dL (ref 3.5–5.2)
Alkaline Phosphatase: 97 U/L (ref 39–117)
BILIRUBIN DIRECT: 0.1 mg/dL (ref 0.0–0.3)
BILIRUBIN TOTAL: 0.6 mg/dL (ref 0.2–1.2)
Total Protein: 6.4 g/dL (ref 6.0–8.3)

## 2018-10-09 LAB — HEMOGLOBIN A1C: Hgb A1c MFr Bld: 5.8 % (ref 4.6–6.5)

## 2018-10-09 NOTE — Progress Notes (Signed)
Subjective:   Timothy Wang is a 68 y.o. male who presents for Medicare Annual/Subsequent preventive examination.  Review of Systems:  N/A Cardiac Risk Factors include: advanced age (>33men, >24 women);male gender;hypertension;dyslipidemia;obesity (BMI >30kg/m2)     Objective:    Vitals: BP 102/82 (BP Location: Right Arm, Patient Position: Sitting, Cuff Size: Large)   Pulse 83   Temp 98.1 F (36.7 C) (Oral)   Ht 5' 9.5" (1.765 m) Comment: no shoes  Wt 222 lb 9.6 oz (101 kg)   SpO2 95%   BMI 32.40 kg/m   Body mass index is 32.4 kg/m.  Advanced Directives 10/09/2018 11/03/2016 10/26/2016  Does Patient Have a Medical Advance Directive? No No No  Would patient like information on creating a medical advance directive? Yes (MAU/Ambulatory/Procedural Areas - Information given) Yes (MAU/Ambulatory/Procedural Areas - Information given) -    Tobacco Social History   Tobacco Use  Smoking Status Former Smoker  . Packs/day: 1.00  . Years: 10.00  . Pack years: 10.00  . Types: Cigarettes  . Last attempt to quit: 10/27/2002  . Years since quitting: 15.9  Smokeless Tobacco Never Used  Tobacco Comment   quit 2004     Counseling given: No Comment: quit 2004   Clinical Intake:  Pre-visit preparation completed: Yes  Pain : No/denies pain Pain Score: 0-No pain     Nutritional Status: BMI > 30  Obese Nutritional Risks: None Diabetes: No  How often do you need to have someone help you when you read instructions, pamphlets, or other written materials from your doctor or pharmacy?: 1 - Never What is the last grade level you completed in school?: 12th grade + 3.5 yrs of college  Interpreter Needed?: No  Comments: pt lives independently Information entered by :: LPinson, LPN  Past Medical History:  Diagnosis Date  . Allergic rhinitis due to pollen   . Cataract 2020   right eye  . Depression   . GERD (gastroesophageal reflux disease)   . H/O: Bell's palsy   . Hyperlipidemia    . Hypertension   . Ventral hernia    Past Surgical History:  Procedure Laterality Date  . COLONOSCOPY    . [place]  . KNEE SURGERY  2015  . TONSILLECTOMY  1960  . VENTRAL HERNIA REPAIR N/A 11/03/2016   Procedure: HERNIA REPAIR VENTRAL ADULT WITH MESH;  Surgeon: Clayburn Pert, MD;  Location: [facility] ORS;  Service: General;  Laterality: N/A;   Family History  Problem Relation Age of Onset  . Pancreatic cancer Mother   . Alcohol abuse Father   . Heart attack Father   . Thyroid disease Sister   . Depression Sister   . Cancer Sister        breast   Social History   Socioeconomic History  . Marital status: Single    Spouse name: Not on file  . Number of children: Not on file  . Years of education: Not on file  . Highest education level: Not on file  Occupational History  . Occupation: Scientist, clinical (histocompatibility and immunogenetics): [employer]  Social Needs  . Financial resource strain: Not on file  . Food insecurity:    Worry: Not on file    Inability: Not on file  . Transportation needs:    Medical: Not on file    Non-medical: Not on file  Tobacco Use  . Smoking status: Former Smoker    Packs/day: 1.00    Years: 10.00  Pack years: 10.00    Types: Cigarettes    Last attempt to quit: 10/27/2002    Years since quitting: 15.9  . Smokeless tobacco: Never Used  . Tobacco comment: quit 2004  Substance and Sexual Activity  . Alcohol use: Yes    Comment: Occassionally beer and wine  . Drug use: No  . Sexual activity: Not on file  Lifestyle  . Physical activity:    Days per week: Not on file    Minutes per session: Not on file  . Stress: Not on file  Relationships  . Social connections:    Talks on phone: Not on file    Gets together: Not on file    Attends religious service: Not on file    Active member of club or organization: Not on file    Attends meetings of clubs or organizations: Not on file    Relationship status: Not on file  Other Topics Concern  . Not on file   Social History Narrative  . Not on file    Outpatient Encounter Medications as of 10/09/2018  Medication Sig  . aspirin 325 MG EC tablet Take 650 mg by mouth once a week.   Marland Kitchen azelastine (ASTELIN) 0.1 % nasal spray USE 2 SPRAY(S) IN EACH NOSTRIL TWICE DAILY  . famotidine (PEPCID) 10 MG tablet Take 10 mg by mouth daily as needed for indigestion.   . fluticasone (FLONASE) 50 MCG/ACT nasal spray USE TWO SPRAY(S) IN EACH NOSTRIL ONCE DAILY AS NEEDED (Patient taking differently: USE TWO SPRAY(S) IN EACH NOSTRIL ONCE DAILY AS NEEDED ALLERGIES)  . ibuprofen (ADVIL,MOTRIN) 200 MG tablet Take 200 mg by mouth daily as needed.  . naproxen sodium (ANAPROX) 220 MG tablet Take 220 mg by mouth daily as needed.  . venlafaxine XR (EFFEXOR-XR) 37.5 MG 24 hr capsule Take 1 capsule (37.5 mg total) by mouth daily with breakfast. (Patient taking differently: Take 37.5 mg by mouth 2 (two) times a week. USUALLY TAKES ON THE WEEKENDS ONLY)   No facility-administered encounter medications on file as of 10/09/2018.     Activities of Daily Living In your present state of health, do you have any difficulty performing the following activities: 10/09/2018  Hearing? N  Vision? N  Difficulty concentrating or making decisions? N  Walking or climbing stairs? N  Dressing or bathing? N  Doing errands, shopping? N  Preparing Food and eating ? N  Using the Toilet? N  In the past six months, have you accidently leaked urine? N  Do you have problems with loss of bowel control? N  Managing your Medications? N  Managing your Finances? N  Housekeeping or managing your Housekeeping? N  Some recent data might be hidden    Patient Care Team: Owens Loffler, MD as PCP - General   Assessment:   This is a routine wellness examination for Timothy Wang.   Hearing Screening   125Hz  250Hz  500Hz  1000Hz  2000Hz  3000Hz  4000Hz  6000Hz  8000Hz   Right ear:   40 40 40  40    Left ear:   40 40 40  40    Vision Screening Comments: Vision exam  every 6 mths with Dr. Sandra Cockayne   Exercise Activities and Dietary recommendations Current Exercise Habits: The patient does not participate in regular exercise at present, Exercise limited by: None identified  Goals    . Patient Stated     Starting 10/09/2018, I will continue to take medications as prescribed.        Fall Risk  Fall Risk  10/09/2018 09/05/2017  Falls in the past year? 0 No   Depression Screen PHQ 2/9 Scores 10/09/2018 09/05/2017  PHQ - 2 Score 0 0  PHQ- 9 Score 0 -    Cognitive Function MMSE - Mini Mental State Exam 10/09/2018  Orientation to time 5  Orientation to Place 5  Registration 3  Attention/ Calculation 0  Recall 3  Language- name 2 objects 0  Language- repeat 1  Language- follow 3 step command 3  Language- read & follow direction 0  Write a sentence 0  Copy design 0  Total score 20     PLEASE NOTE: A Mini-Cog screen was completed. Maximum score is 20. A value of 0 denotes this part of Folstein MMSE was not completed or the patient failed this part of the Mini-Cog screening.   Mini-Cog Screening Orientation to Time - Max 5 pts Orientation to Place - Max 5 pts Registration - Max 3 pts Recall - Max 3 pts Language Repeat - Max 1 pts Language Follow 3 Step Command - Max 3 pts     Immunization History  Administered Date(s) Administered  . Influenza Whole 04/10/2011  . Influenza,inj,Quad PF,6+ Mos 04/30/2014, 06/05/2016, 05/24/2017, 10/09/2018  . Pneumococcal Conjugate-13 05/24/2017  . Pneumococcal Polysaccharide-23 10/09/2018  . Tdap 02/17/2013  . Zoster 09/10/2014  . Zoster Recombinat (Shingrix) 04/18/2018, 08/08/2018   Screening Tests Health Maintenance  Topic Date Due  . COLONOSCOPY  05/31/2022  . TETANUS/TDAP  02/18/2023  . INFLUENZA VACCINE  Completed  . Hepatitis C Screening  Completed  . PNA vac Low Risk Adult  Completed       Plan:     I have personally reviewed, addressed, and noted the following in the patient's chart:   A. Medical and social history B. Use of alcohol, tobacco or illicit drugs  C. Current medications and supplements D. Functional ability and status E.  Nutritional status F.  Physical activity G. Advance directives H. List of other physicians I.  Hospitalizations, surgeries, and ER visits in previous 12 months J.  [place] to include hearing, vision, cognitive, depression L. Referrals and appointments - none  In addition, I have reviewed and discussed with patient certain preventive protocols, quality metrics, and best practice recommendations. A written personalized care plan for preventive services as well as general preventive health recommendations were provided to patient.  See attached scanned questionnaire for additional information.   Signed,   Lindell Noe, MHA, BS, LPN Health Coach

## 2018-10-09 NOTE — Patient Instructions (Addendum)
Timothy Wang , Thank you for taking time to come for your Medicare Wellness Visit. I appreciate your ongoing commitment to your health goals. Please review the following plan we discussed and let me know if I can assist you in the future.   These are the goals we discussed: Goals    . Patient Stated     Starting 10/09/2018, I will continue to take medications as prescribed.        This is a list of the screening recommended for you and due dates:  Health Maintenance  Topic Date Due  . Colon Cancer Screening  05/31/2022  . Tetanus Vaccine  02/18/2023  . Flu Shot  Completed  .  Hepatitis C: One time screening is recommended by Center for Disease Control  (CDC) for  adults born from [birth year] through 1964.   Completed  . Pneumonia vaccines  Completed   Preventive Care for Adults  A healthy lifestyle and preventive care can promote health and wellness. Preventive health guidelines for adults include the following key practices.  . A routine yearly physical is a good way to check with your health care provider about your health and preventive screening. It is a chance to share any concerns and updates on your health and to receive a thorough exam.  . Visit your dentist for a routine exam and preventive care every 6 months. Brush your teeth twice a day and floss once a day. Good oral hygiene prevents tooth decay and gum disease.  . The frequency of eye exams is based on your age, health, family medical history, use  of contact lenses, and other factors. Follow your health care provider's recommendations for frequency of eye exams.  . Eat a healthy diet. Foods like vegetables, fruits, whole grains, low-fat dairy products, and lean protein foods contain the nutrients you need without too many calories. Decrease your intake of foods high in solid fats, added sugars, and salt. Eat the right amount of calories for you. Get information about a proper diet from your health care provider, if necessary.  .  Regular physical exercise is one of the most important things you can do for your health. Most adults should get at least 150 minutes of moderate-intensity exercise (any activity that increases your heart rate and causes you to sweat) each week. In addition, most adults need muscle-strengthening exercises on 2 or more days a week.  Silver Sneakers may be a benefit available to you. To determine eligibility, you may visit the website: www.silversneakers.com or contact program at 5080225013 Mon-Fri between 8AM-8PM.   . Maintain a healthy weight. The body mass index (BMI) is a screening tool to identify possible weight problems. It provides an estimate of body fat based on height and weight. Your health care provider can find your BMI and can help you achieve or maintain a healthy weight.   For adults 20 years and older: ? A BMI below 18.5 is considered underweight. ? A BMI of 18.5 to 24.9 is normal. ? A BMI of 25 to 29.9 is considered overweight. ? A BMI of 30 and above is considered obese.   . Maintain normal blood lipids and cholesterol levels by exercising and minimizing your intake of saturated fat. Eat a balanced diet with plenty of fruit and vegetables. Blood tests for lipids and cholesterol should begin at age 9 and be repeated every 5 years. If your lipid or cholesterol levels are high, you are over 50, or you are at high risk for  heart disease, you may need your cholesterol levels checked more frequently. Ongoing high lipid and cholesterol levels should be treated with medicines if diet and exercise are not working.  . If you smoke, find out from your health care provider how to quit. If you do not use tobacco, please do not start.  . If you choose to drink alcohol, please do not consume more than 2 drinks per day. One drink is considered to be 12 ounces (355 mL) of beer, 5 ounces (148 mL) of wine, or 1.5 ounces (44 mL) of liquor.  . If you are 35-66 years old, ask your health care  provider if you should take aspirin to prevent strokes.  . Use sunscreen. Apply sunscreen liberally and repeatedly throughout the day. You should seek shade when your shadow is shorter than you. Protect yourself by wearing long sleeves, pants, a wide-brimmed hat, and sunglasses year round, whenever you are outdoors.  . Once a month, do a whole body skin exam, using a mirror to look at the skin on your back. Tell your health care provider of new moles, moles that have irregular borders, moles that are larger than a pencil eraser, or moles that have changed in shape or color.

## 2018-10-09 NOTE — Progress Notes (Signed)
I reviewed health advisor's note, was available for consultation, and agree with documentation and plan.   Signed,  Azael Ragain T. Raelynn Corron, MD  

## 2018-10-09 NOTE — Progress Notes (Signed)
PCP notes:   Health maintenance:  Flu vaccine - administered PPSV23 vaccine - administered  Abnormal screenings:   None  Patient concerns:   None  Nurse concerns:  None  Next PCP appt:   10/10/18 @ 1020

## 2018-10-10 ENCOUNTER — Encounter: Payer: Medicare Other | Admitting: Family Medicine

## 2018-10-10 ENCOUNTER — Ambulatory Visit (INDEPENDENT_AMBULATORY_CARE_PROVIDER_SITE_OTHER): Payer: Medicare Other | Admitting: Family Medicine

## 2018-10-10 ENCOUNTER — Encounter: Payer: Self-pay | Admitting: Family Medicine

## 2018-10-10 ENCOUNTER — Other Ambulatory Visit: Payer: Self-pay

## 2018-10-10 VITALS — BP 140/84 | HR 81 | Temp 97.9°F | Ht 69.5 in | Wt 225.0 lb

## 2018-10-10 DIAGNOSIS — F325 Major depressive disorder, single episode, in full remission: Secondary | ICD-10-CM

## 2018-10-10 DIAGNOSIS — I10 Essential (primary) hypertension: Secondary | ICD-10-CM

## 2018-10-10 DIAGNOSIS — E7800 Pure hypercholesterolemia, unspecified: Secondary | ICD-10-CM | POA: Diagnosis not present

## 2018-10-10 LAB — PSA, TOTAL AND FREE
PSA, % FREE: 33 % (ref >25)
PSA, Free: 0.2 ng/mL
PSA, Total: 0.6 ng/mL (ref <=4.0)

## 2018-10-10 NOTE — Progress Notes (Signed)
Dr. Frederico Hamman T. Aleria Maheu, MD, [facility] [place], [address] Phone: [phone] Fax: [phone]  10/10/2018  Patient: Timothy Wang, MRN: [id number], DOB: [birth year], 68 y.o.  Primary Physician:  Owens Loffler, MD   Chief Complaint  Patient presents with  . Annual Exam    Part 2   Subjective:   Gearl Wang is a 68 y.o. very pleasant male patient who presents with the following:  Some stress at work ongoing.  Death at work.   Thinking about retirement.   HTN: n omeds now Stable and at goal No CP, no sob. No HA.  BP Readings from Last 3 Encounters:  10/10/18 140/84  10/09/18 102/82  09/26/17 865/78    Basic Metabolic Panel:    Component Value Date/Time   NA 139 10/09/2018 0916   K 4.6 10/09/2018 0916   CL 103 10/09/2018 0916   CO2 32 10/09/2018 0916   BUN 16 10/09/2018 0916   CREATININE 0.79 10/09/2018 0916   GLUCOSE 87 10/09/2018 0916   CALCIUM 9.0 10/09/2018 0916    Lipids: Doing well, stable. Better than prior years. Panel reviewed with patient.  Lipids:    Component Value Date/Time   CHOL 181 10/09/2018 0916   TRIG 57.0 10/09/2018 0916   HDL 43.30 10/09/2018 0916   LDLDIRECT 172.7 04/20/2010 1006   VLDL 11.4 10/09/2018 0916   CHOLHDL 4 10/09/2018 0916    Lab Results  Component Value Date   ALT 13 10/09/2018   AST 15 10/09/2018   ALKPHOS 97 10/09/2018   BILITOT 0.6 10/09/2018     Depression is doing OK, some occ moments  Wt Readings from Last 3 Encounters:  10/10/18 225 lb (102.1 kg)  10/09/18 222 lb 9.6 oz (101 kg)  09/26/17 223 lb 12 oz (101.5 kg)    Immunization History  Administered Date(s) Administered  . Influenza Whole 04/10/2011  . Influenza,inj,Quad PF,6+ Mos 04/30/2014, 06/05/2016, 05/24/2017, 10/09/2018  . Pneumococcal Conjugate-13 05/24/2017  . Pneumococcal Polysaccharide-23 10/09/2018  . Tdap 02/17/2013  . Zoster 09/10/2014  . Zoster Recombinat (Shingrix)  04/18/2018, 08/08/2018     Past Medical History, Surgical History, Social History, Family History, Problem List, Medications, and Allergies have been reviewed and updated if relevant.  Patient Active Problem List   Diagnosis Date Noted  . Tendon rupture, traumatic. quadriceps, left, subsequent encounter 06/11/2014  . Essential hypertension 08/04/2010  . Hyperlipidemia 05/28/2008  . Ventral hernia 05/28/2008  . FATIGUE 05/28/2008  . DEPRESSION 04/21/2008  . ALLERGIC RHINITIS 04/21/2008    Past Medical History:  Diagnosis Date  . Allergic rhinitis due to pollen   . Cataract 2020   right eye  . Depression   . GERD (gastroesophageal reflux disease)   . H/O: Bell's palsy   . Hyperlipidemia   . Hypertension   . Ventral hernia     Past Surgical History:  Procedure Laterality Date  . COLONOSCOPY    . [place]  . KNEE SURGERY  2015  . TONSILLECTOMY  1960  . VENTRAL HERNIA REPAIR N/A 11/03/2016   Procedure: HERNIA REPAIR VENTRAL ADULT WITH MESH;  Surgeon: Clayburn Pert, MD;  Location: [facility] ORS;  Service: General;  Laterality: N/A;    Social History   Socioeconomic History  . Marital status: Single    Spouse name: Not on file  . Number of children: Not on file  . Years of education: Not on file  . Highest education level:  Not on file  Occupational History  . Occupation: Scientist, clinical (histocompatibility and immunogenetics): [employer]  Social Needs  . Financial resource strain: Not on file  . Food insecurity:    Worry: Not on file    Inability: Not on file  . Transportation needs:    Medical: Not on file    Non-medical: Not on file  Tobacco Use  . Smoking status: Former Smoker    Packs/day: 1.00    Years: 10.00    Pack years: 10.00    Types: Cigarettes    Last attempt to quit: 10/27/2002    Years since quitting: 15.9  . Smokeless tobacco: Never Used  . Tobacco comment: quit 2004  Substance and Sexual Activity  . Alcohol use: Yes    Comment: Occassionally beer and wine  . Drug  use: No  . Sexual activity: Not on file  Lifestyle  . Physical activity:    Days per week: Not on file    Minutes per session: Not on file  . Stress: Not on file  Relationships  . Social connections:    Talks on phone: Not on file    Gets together: Not on file    Attends religious service: Not on file    Active member of club or organization: Not on file    Attends meetings of clubs or organizations: Not on file    Relationship status: Not on file  . Intimate partner violence:    Fear of current or ex partner: Not on file    Emotionally abused: Not on file    Physically abused: Not on file    Forced sexual activity: Not on file  Other Topics Concern  . Not on file  Social History Narrative  . Not on file    Family History  Problem Relation Age of Onset  . Pancreatic cancer Mother   . Alcohol abuse Father   . Heart attack Father   . Thyroid disease Sister   . Depression Sister   . Cancer Sister        breast    No Known Allergies  Medication list reviewed and updated in full in [place].   GEN: No acute illnesses, no fevers, chills. GI: No n/v/d, eating normally Pulm: No SOB Interactive and getting along well at home.  Otherwise, ROS is as per the HPI.  Objective:   BP 140/84   Pulse 81   Temp 97.9 F (36.6 C) (Oral)   Ht 5' 9.5" (1.765 m)   Wt 225 lb (102.1 kg)   BMI 32.75 kg/m   Ideal body weight: 71.8 kg (158 lb 6.4 oz) Adjusted ideal body weight: 83.9 kg (185 lb 0.6 oz)   GEN: WDWN, NAD, Non-toxic, A & O x 3 HEENT: Atraumatic, Normocephalic. Neck supple. No masses, No LAD. Ears and Nose: No external deformity. CV: RRR, No M/G/R. No JVD. No thrill. No extra heart sounds. PULM: CTA B, no wheezes, crackles, rhonchi. No retractions. No resp. distress. No accessory muscle use. EXTR: No c/c/e NEURO Normal gait.  PSYCH: Normally interactive. Conversant. Not depressed or anxious appearing.  Calm demeanor.   Laboratory and Imaging Data:  Results for orders placed or performed in visit on 10/09/18  Hemoglobin A1c  Result Value Ref Range   Hgb A1c MFr Bld 5.8 4.6 - 6.5 %  PSA, total and free  Result Value Ref Range   PSA, Total 0.6 < OR = 4.0 ng/mL   PSA, Free 0.2 ng/mL  PSA, % Free 33 >25 % (calc)  Basic metabolic panel  Result Value Ref Range   Sodium 139 135 - 145 mEq/L   Potassium 4.6 3.5 - 5.1 mEq/L   Chloride 103 96 - 112 mEq/L   CO2 32 19 - 32 mEq/L   Glucose, Bld 87 70 - 99 mg/dL   BUN 16 6 - 23 mg/dL   Creatinine, Ser 0.79 0.40 - 1.50 mg/dL   Calcium 9.0 8.4 - 10.5 mg/dL   GFR 97.83 >60.00 mL/min  CBC with Differential/Platelet  Result Value Ref Range   WBC 4.2 4.0 - 10.5 K/uL   RBC 5.12 4.22 - 5.81 Mil/uL   Hemoglobin 15.7 13.0 - 17.0 g/dL   HCT 46.7 39.0 - 52.0 %   MCV 91.1 78.0 - 100.0 fl   MCHC 33.6 30.0 - 36.0 g/dL   RDW 14.0 11.5 - 15.5 %   Platelets 241.0 150.0 - 400.0 K/uL   Neutrophils Relative % 68.8 43.0 - 77.0 %   Lymphocytes Relative 18.5 12.0 - 46.0 %   Monocytes Relative 10.2 3.0 - 12.0 %   Eosinophils Relative 1.5 0.0 - 5.0 %   Basophils Relative 1.0 0.0 - 3.0 %   Neutro Abs 2.9 1.4 - 7.7 K/uL   Lymphs Abs 0.8 0.7 - 4.0 K/uL   Monocytes Absolute 0.4 0.1 - 1.0 K/uL   Eosinophils Absolute 0.1 0.0 - 0.7 K/uL   Basophils Absolute 0.0 0.0 - 0.1 K/uL  Hepatic function panel  Result Value Ref Range   Total Bilirubin 0.6 0.2 - 1.2 mg/dL   Bilirubin, Direct 0.1 0.0 - 0.3 mg/dL   Alkaline Phosphatase 97 39 - 117 U/L   AST 15 0 - 37 U/L   ALT 13 0 - 53 U/L   Total Protein 6.4 6.0 - 8.3 g/dL   Albumin 4.1 3.5 - 5.2 g/dL  Lipid panel  Result Value Ref Range   Cholesterol 181 0 - 200 mg/dL   Triglycerides 57.0 0.0 - 149.0 mg/dL   HDL 43.30 >39.00 mg/dL   VLDL 11.4 0.0 - 40.0 mg/dL   LDL Cholesterol 127 (H) 0 - 99 mg/dL   Total CHOL/HDL Ratio 4    NonHDL 138.04      Assessment and Plan:   Essential hypertension  Pure hypercholesterolemia  Depression, major, in remission  ([place])  Globally doing well Stable  Work on diet and exercise  Follow-up: next year CPX  Signed,  Naidelin Gugliotta T. Berthel Bagnall, MD   Outpatient Encounter Medications as of 10/10/2018  Medication Sig  . aspirin 325 MG EC tablet Take 650 mg by mouth once a week.   Marland Kitchen azelastine (ASTELIN) 0.1 % nasal spray USE 2 SPRAY(S) IN EACH NOSTRIL TWICE DAILY  . famotidine (PEPCID) 10 MG tablet Take 10 mg by mouth daily as needed for indigestion.   . fluticasone (FLONASE) 50 MCG/ACT nasal spray USE TWO SPRAY(S) IN EACH NOSTRIL ONCE DAILY AS NEEDED (Patient taking differently: USE TWO SPRAY(S) IN EACH NOSTRIL ONCE DAILY AS NEEDED ALLERGIES)  . ibuprofen (ADVIL,MOTRIN) 200 MG tablet Take 200 mg by mouth daily as needed.  . naproxen sodium (ANAPROX) 220 MG tablet Take 220 mg by mouth daily as needed.  . [DISCONTINUED] venlafaxine XR (EFFEXOR-XR) 37.5 MG 24 hr capsule Take 1 capsule (37.5 mg total) by mouth daily with breakfast. (Patient taking differently: Take 37.5 mg by mouth 2 (two) times a week. USUALLY TAKES ON THE WEEKENDS ONLY)   No facility-administered encounter medications on file as of  10/10/2018.     

## 2018-10-11 ENCOUNTER — Encounter: Payer: Self-pay | Admitting: Family Medicine

## 2018-11-11 ENCOUNTER — Encounter: Payer: Self-pay | Admitting: Family Medicine

## 2018-12-11 DIAGNOSIS — H353131 Nonexudative age-related macular degeneration, bilateral, early dry stage: Secondary | ICD-10-CM | POA: Diagnosis not present

## 2019-04-17 ENCOUNTER — Encounter: Payer: Self-pay | Admitting: Family Medicine

## 2019-06-10 DIAGNOSIS — H2513 Age-related nuclear cataract, bilateral: Secondary | ICD-10-CM | POA: Diagnosis not present

## 2019-09-02 ENCOUNTER — Encounter: Payer: Self-pay | Admitting: Family Medicine

## 2019-09-20 ENCOUNTER — Ambulatory Visit: Payer: Medicare Other | Attending: Internal Medicine

## 2019-09-20 DIAGNOSIS — Z23 Encounter for immunization: Secondary | ICD-10-CM | POA: Insufficient documentation

## 2019-09-20 NOTE — Progress Notes (Signed)
   Covid-19 Vaccination Clinic  Name:  Timothy Wang    MRN: [id number] DOB: Jul 12, 1951  09/20/2019  [name] was observed post Covid-19 immunization for 15 minutes without incidence. He was provided with Vaccine Information Sheet and instruction to access the V-Safe system.   [name] was instructed to call 911 with any severe reactions post vaccine: Marland Kitchen Difficulty breathing  . Swelling of your face and throat  . A fast heartbeat  . A bad rash all over your body  . Dizziness and weakness    Immunizations Administered    Name Date Dose VIS Date Route   Pfizer COVID-19 Vaccine 09/20/2019 11:47 AM 0.3 mL 07/11/2019 Intramuscular   Manufacturer: [place]   Lot: [id number]   [place]: [id number]

## 2019-10-07 ENCOUNTER — Other Ambulatory Visit: Payer: Self-pay | Admitting: Family Medicine

## 2019-10-07 DIAGNOSIS — E7800 Pure hypercholesterolemia, unspecified: Secondary | ICD-10-CM

## 2019-10-07 DIAGNOSIS — Z79899 Other long term (current) drug therapy: Secondary | ICD-10-CM

## 2019-10-07 DIAGNOSIS — Z125 Encounter for screening for malignant neoplasm of prostate: Secondary | ICD-10-CM

## 2019-10-07 DIAGNOSIS — Z131 Encounter for screening for diabetes mellitus: Secondary | ICD-10-CM

## 2019-10-07 DIAGNOSIS — R739 Hyperglycemia, unspecified: Secondary | ICD-10-CM

## 2019-10-10 ENCOUNTER — Telehealth: Payer: Self-pay

## 2019-10-10 ENCOUNTER — Other Ambulatory Visit (INDEPENDENT_AMBULATORY_CARE_PROVIDER_SITE_OTHER): Payer: Medicare Other

## 2019-10-10 ENCOUNTER — Other Ambulatory Visit: Payer: Self-pay

## 2019-10-10 ENCOUNTER — Ambulatory Visit: Payer: Medicare Other

## 2019-10-10 DIAGNOSIS — R739 Hyperglycemia, unspecified: Secondary | ICD-10-CM | POA: Diagnosis not present

## 2019-10-10 DIAGNOSIS — Z125 Encounter for screening for malignant neoplasm of prostate: Secondary | ICD-10-CM | POA: Diagnosis not present

## 2019-10-10 DIAGNOSIS — Z79899 Other long term (current) drug therapy: Secondary | ICD-10-CM | POA: Diagnosis not present

## 2019-10-10 DIAGNOSIS — E7800 Pure hypercholesterolemia, unspecified: Secondary | ICD-10-CM

## 2019-10-10 LAB — BASIC METABOLIC PANEL
BUN: 13 mg/dL (ref 6–23)
CO2: 34 mEq/L — ABNORMAL HIGH (ref 19–32)
Calcium: 9.2 mg/dL (ref 8.4–10.5)
Chloride: 101 mEq/L (ref 96–112)
Creatinine, Ser: 0.8 mg/dL (ref 0.40–1.50)
GFR: 96.13 mL/min (ref >60.00)
Glucose, Bld: 84 mg/dL (ref 70–99)
Potassium: 4.8 mEq/L (ref 3.5–5.1)
Sodium: 139 mEq/L (ref 135–145)

## 2019-10-10 LAB — CBC WITH DIFFERENTIAL/PLATELET
Basophils Absolute: 0.1 10*3/uL (ref 0.0–0.1)
Basophils Relative: 1.3 % (ref 0.0–3.0)
Eosinophils Absolute: 0.1 10*3/uL (ref 0.0–0.7)
Eosinophils Relative: 1.7 % (ref 0.0–5.0)
HCT: 45.7 % (ref 39.0–52.0)
Hemoglobin: 15.4 g/dL (ref 13.0–17.0)
Lymphocytes Relative: 22.1 % (ref 12.0–46.0)
Lymphs Abs: 1 10*3/uL (ref 0.7–4.0)
MCHC: 33.6 g/dL (ref 30.0–36.0)
MCV: 91.3 fl (ref 78.0–100.0)
Monocytes Absolute: 0.4 10*3/uL (ref 0.1–1.0)
Monocytes Relative: 9.4 % (ref 3.0–12.0)
Neutro Abs: 3.1 10*3/uL (ref 1.4–7.7)
Neutrophils Relative %: 65.5 % (ref 43.0–77.0)
Platelets: 263 10*3/uL (ref 150.0–400.0)
RBC: 5 Mil/uL (ref 4.22–5.81)
RDW: 13.7 % (ref 11.5–15.5)
WBC: 4.7 10*3/uL (ref 4.0–10.5)

## 2019-10-10 LAB — LIPID PANEL
Cholesterol: 194 mg/dL (ref 0–200)
HDL: 42.6 mg/dL (ref >39.00)
LDL Cholesterol: 136 mg/dL — ABNORMAL HIGH (ref 0–99)
NonHDL: 151.09
Total CHOL/HDL Ratio: 5
Triglycerides: 73 mg/dL (ref 0.0–149.0)
VLDL: 14.6 mg/dL (ref 0.0–40.0)

## 2019-10-10 LAB — HEPATIC FUNCTION PANEL
ALT: 16 U/L (ref 0–53)
AST: 19 U/L (ref 0–37)
Albumin: 4.1 g/dL (ref 3.5–5.2)
Alkaline Phosphatase: 95 U/L (ref 39–117)
Bilirubin, Direct: 0.1 mg/dL (ref 0.0–0.3)
Total Bilirubin: 0.8 mg/dL (ref 0.2–1.2)
Total Protein: 6.8 g/dL (ref 6.0–8.3)

## 2019-10-10 LAB — HEMOGLOBIN A1C: Hgb A1c MFr Bld: 5.6 % (ref 4.6–6.5)

## 2019-10-10 NOTE — Telephone Encounter (Signed)
FYI- Called patient 5 times trying to complete his Medicare visit. Patient never answered. Unable to leave a message because mailbox is full. Appointment was cancelled.

## 2019-10-13 ENCOUNTER — Encounter: Payer: Self-pay | Admitting: Family Medicine

## 2019-10-13 ENCOUNTER — Ambulatory Visit (INDEPENDENT_AMBULATORY_CARE_PROVIDER_SITE_OTHER): Payer: Medicare Other | Admitting: Family Medicine

## 2019-10-13 ENCOUNTER — Other Ambulatory Visit: Payer: Self-pay

## 2019-10-13 VITALS — BP 120/80 | HR 76 | Temp 98.1°F | Ht 69.5 in | Wt 224.2 lb

## 2019-10-13 DIAGNOSIS — Z0000 Encounter for general adult medical examination without abnormal findings: Secondary | ICD-10-CM

## 2019-10-13 LAB — PSA, TOTAL WITH REFLEX TO PSA, FREE: PSA, Total: 0.5 ng/mL (ref <=4.0)

## 2019-10-13 NOTE — Progress Notes (Signed)
July Linam T. Layman Gully, MD Primary Care and [place] at [facility] [place], [address] Phone: [phone]  FAX: [phone]  Timothy Wang - 69 y.o. male  MRN [id number]  Date of Birth: 06-30-1951  Visit Date: 10/13/2019  PCP: Owens Loffler, MD  Referred by: Owens Loffler, MD  Chief Complaint  Patient presents with  . Medicare Wellness    This visit occurred during the SARS-CoV-2 public health emergency.  Safety protocols were in place, including screening questions prior to the visit, additional usage of staff PPE, and extensive cleaning of exam room while observing appropriate contact time as indicated for disinfecting solutions.   Patient Care Team: Owens Loffler, MD as PCP - General Subjective:   Timothy Wang is a 69 y.o. pleasant patient who presents for a medicare wellness examination:  Preventative Health Maintenance Visit:  Health Maintenance Summary Reviewed and updated, unless pt declines services.  Tobacco History Reviewed. Alcohol: No concerns, no excessive use Exercise Habits: Some activity, rec at least 30 mins 5 times a week STD concerns: no risk or activity to increase risk Drug Use: None  Does not depressed himself.  Feels for the country. Feeling a whole lot better.  Has been a long year. Retired this year.   PHQ is neg   Health Maintenance  Topic Date Due  . COLONOSCOPY  05/31/2022  . TETANUS/TDAP  02/18/2023  . INFLUENZA VACCINE  Completed  . Hepatitis C Screening  Completed  . PNA vac Low Risk Adult  Completed    Immunization History  Administered Date(s) Administered  . Influenza Whole 04/10/2011  . Influenza,inj,Quad PF,6+ Mos 04/30/2014, 06/05/2016, 05/24/2017, 10/09/2018  . Influenza-Unspecified 04/18/2019  . PFIZER SARS-COV-2 Vaccination 09/20/2019  . Pneumococcal Conjugate-13 05/24/2017  . Pneumococcal Polysaccharide-23 10/09/2018  . Tdap 02/17/2013  . Zoster  09/10/2014  . Zoster Recombinat (Shingrix) 04/18/2018, 08/08/2018    Patient Active Problem List   Diagnosis Date Noted  . Tendon rupture, traumatic. quadriceps, left, subsequent encounter 06/11/2014  . Essential hypertension 08/04/2010  . Hyperlipidemia 05/28/2008  . Ventral hernia 05/28/2008  . FATIGUE 05/28/2008  . Depression, major, in remission ([facility]) 04/21/2008  . ALLERGIC RHINITIS 04/21/2008    Past Medical History:  Diagnosis Date  . Allergic rhinitis due to pollen   . Cataract 2020   right eye  . Depression   . GERD (gastroesophageal reflux disease)   . H/O: Bell's palsy   . Hyperlipidemia   . Hypertension   . Ventral hernia     Past Surgical History:  Procedure Laterality Date  . COLONOSCOPY    . [place]  . KNEE SURGERY  2015  . TONSILLECTOMY  1960  . VENTRAL HERNIA REPAIR N/A 11/03/2016   Procedure: HERNIA REPAIR VENTRAL ADULT WITH MESH;  Surgeon: Clayburn Pert, MD;  Location: [facility] ORS;  Service: General;  Laterality: N/A;    Family History  Problem Relation Age of Onset  . Pancreatic cancer Mother   . Alcohol abuse Father   . Heart attack Father   . Thyroid disease Sister   . Depression Sister   . Cancer Sister        breast    Past Medical History, Surgical History, Social History, Family History, Problem List, Medications, and Allergies have been reviewed and updated if relevant.  Review of Systems: Pertinent positives are listed above.  Otherwise, a full 14 point review of systems has been done in full and it is  negative except where it is noted positive.  Objective:   BP 120/80   Pulse 76   Temp 98.1 F (36.7 C) (Temporal)   Ht 5' 9.5" (1.765 m)   Wt 224 lb 4 oz (101.7 kg)   SpO2 96%   BMI 32.64 kg/m  Fall Risk  10/13/2019 10/09/2018 09/05/2017  Falls in the past year? 0 0 No   Ideal Body Weight: Weight in (lb) to have BMI = 25: 171.4  Hearing Screening   Method: Audiometry   125Hz  250Hz  500Hz  1000Hz  2000Hz  3000Hz  4000Hz   6000Hz  8000Hz   Right ear:   20 25 20  20     Left ear:   20 40 40  20    Vision Screening Comments: Wear Glasses-Eye Exam with [facility] Dr. Sandra Cockayne 07/2019 Depression screen [facility] 2/9 10/09/2018 09/05/2017  Decreased Interest 0 0  Down, Depressed, Hopeless 0 0  PHQ - 2 Score 0 0  Altered sleeping 0 -  Tired, decreased energy 0 -  Change in appetite 0 -  Feeling bad or failure about yourself  0 -  Trouble concentrating 0 -  Moving slowly or fidgety/restless 0 -  Suicidal thoughts 0 -  PHQ-9 Score 0 -  Difficult doing work/chores Not difficult at all -     GEN: well developed, well nourished, no acute distress Eyes: conjunctiva and lids normal, PERRLA, EOMI ENT: TM clear, nares clear, oral exam WNL Neck: supple, no lymphadenopathy, no thyromegaly, no JVD Pulm: clear to auscultation and percussion, respiratory effort normal CV: regular rate and rhythm, S1-S2, no murmur, rub or gallop, no bruits, peripheral pulses normal and symmetric, no cyanosis, clubbing, edema or varicosities GI: soft, non-tender; no hepatosplenomegaly, masses; active bowel sounds all quadrants GU: no hernia, testicular mass, penile discharge Lymph: no cervical, axillary or inguinal adenopathy MSK: gait normal, muscle tone and strength WNL, no joint swelling, effusions, discoloration, crepitus  SKIN: clear, good turgor, color WNL, no rashes, lesions, or ulcerations Neuro: normal mental status, normal strength, sensation, and motion Psych: alert; oriented to person, place and time, normally interactive and not anxious or depressed in appearance.  All labs reviewed with patient.  Results for orders placed or performed in visit on 10/10/19  Hemoglobin A1c  Result Value Ref Range   Hgb A1c MFr Bld 5.6 4.6 - 6.5 %  CBC with Differential/Platelet  Result Value Ref Range   WBC 4.7 4.0 - 10.5 K/uL   RBC 5.00 4.22 - 5.81 Mil/uL   Hemoglobin 15.4 13.0 - 17.0 g/dL   HCT 45.7 39.0 - 52.0 %   MCV 91.3 78.0 - 100.0  fl   MCHC 33.6 30.0 - 36.0 g/dL   RDW 13.7 11.5 - 15.5 %   Platelets 263.0 150.0 - 400.0 K/uL   Neutrophils Relative % 65.5 43.0 - 77.0 %   Lymphocytes Relative 22.1 12.0 - 46.0 %   Monocytes Relative 9.4 3.0 - 12.0 %   Eosinophils Relative 1.7 0.0 - 5.0 %   Basophils Relative 1.3 0.0 - 3.0 %   Neutro Abs 3.1 1.4 - 7.7 K/uL   Lymphs Abs 1.0 0.7 - 4.0 K/uL   Monocytes Absolute 0.4 0.1 - 1.0 K/uL   Eosinophils Absolute 0.1 0.0 - 0.7 K/uL   Basophils Absolute 0.1 0.0 - 0.1 K/uL  Basic metabolic panel  Result Value Ref Range   Sodium 139 135 - 145 mEq/L   Potassium 4.8 3.5 - 5.1 mEq/L   Chloride 101 96 - 112 mEq/L   CO2  34 (H) 19 - 32 mEq/L   Glucose, Bld 84 70 - 99 mg/dL   BUN 13 6 - 23 mg/dL   Creatinine, Ser 0.80 0.40 - 1.50 mg/dL   GFR 96.13 >60.00 mL/min   Calcium 9.2 8.4 - 10.5 mg/dL  Hepatic function panel  Result Value Ref Range   Total Bilirubin 0.8 0.2 - 1.2 mg/dL   Bilirubin, Direct 0.1 0.0 - 0.3 mg/dL   Alkaline Phosphatase 95 39 - 117 U/L   AST 19 0 - 37 U/L   ALT 16 0 - 53 U/L   Total Protein 6.8 6.0 - 8.3 g/dL   Albumin 4.1 3.5 - 5.2 g/dL  Lipid panel  Result Value Ref Range   Cholesterol 194 0 - 200 mg/dL   Triglycerides 73.0 0.0 - 149.0 mg/dL   HDL 42.60 >39.00 mg/dL   VLDL 14.6 0.0 - 40.0 mg/dL   LDL Cholesterol 136 (H) 0 - 99 mg/dL   Total CHOL/HDL Ratio 5    NonHDL 151.09     Assessment and Plan:     ICD-10-CM   1. Healthcare maintenance  Z00.00    He is going to work on losing weight and getting more exercise.  Health Maintenance Exam: The patient's preventative maintenance and recommended screening tests for an annual wellness exam were reviewed in full today. Brought up to date unless services declined.  Counselled on the importance of diet, exercise, and its role in overall health and mortality. The patient's FH and SH was reviewed, including their home life, tobacco status, and drug and alcohol status.  Follow-up in 1 year for physical  exam or additional follow-up below.  I have personally reviewed the Medicare Annual Wellness questionnaire and have noted 1. The patient's medical and social history 2. Their use of alcohol, tobacco or illicit drugs 3. Their current medications and supplements 4. The patient's functional ability including ADL's, fall risks, home safety risks and hearing or visual             impairment. 5. Diet and physical activities 6. Evidence for depression or mood disorders 7. Reviewed Updated provider list, see scanned forms and [facility] Snapshot.  8. Reviewed whether or not the patient has HCPOA or living will, and discussed what this means with the patient.  Recommended he bring in a copy for his chart in [facility].  The patients weight, height, BMI and visual acuity have been recorded in the chart I have made referrals, counseling and provided education to the patient based review of the above and I have provided the pt with a written personalized care plan for preventive services.  I have provided the patient with a copy of your personalized plan for preventive services. Instructed to take the time to review along with their updated medication list.  Follow-up: No follow-ups on file. Or follow-up in 1 year if not noted.  No orders of the defined types were placed in this encounter.  No orders of the defined types were placed in this encounter.   Signed,  Maud Deed. Telly Broberg, MD   Allergies as of 10/13/2019   No Known Allergies     Medication List       Accurate as of October 13, 2019 10:54 AM. If you have any questions, ask your nurse or doctor.        STOP taking these medications   azelastine 0.1 % nasal spray Commonly known as: ASTELIN Stopped by: Owens Loffler, MD     TAKE these medications  aspirin 325 MG EC tablet Take 325 mg by mouth. Every other day   famotidine 10 MG tablet Commonly known as: PEPCID Take 10 mg by mouth daily as needed for indigestion.   fluticasone 50  MCG/ACT nasal spray Commonly known as: FLONASE Place 2 sprays into both nostrils daily as needed for allergies or rhinitis.   ibuprofen 200 MG tablet Commonly known as: ADVIL Take 200 mg by mouth daily as needed.   naproxen sodium 220 MG tablet Commonly known as: ALEVE Take 220 mg by mouth daily as needed.

## 2019-10-14 ENCOUNTER — Ambulatory Visit: Payer: Medicare Other | Attending: Internal Medicine

## 2019-10-14 DIAGNOSIS — Z23 Encounter for immunization: Secondary | ICD-10-CM

## 2019-10-14 NOTE — Progress Notes (Signed)
   Covid-19 Vaccination Clinic  Name:  Milos Bevans    MRN: [id number] DOB: 1950-10-29  10/14/2019  Mr. Poole was observed post Covid-19 immunization for 15 minutes without incident. He was provided with Vaccine Information Sheet and instruction to access the V-Safe system.   Mr. Pase was instructed to call 911 with any severe reactions post vaccine: Marland Kitchen Difficulty breathing  . Swelling of face and throat  . A fast heartbeat  . A bad rash all over body  . Dizziness and weakness   Immunizations Administered    Name Date Dose VIS Date Route   Pfizer COVID-19 Vaccine 10/14/2019 11:27 AM 0.3 mL 07/11/2019 Intramuscular   Manufacturer: [place]   Lot: [id number]   [place]: [id number]

## 2020-03-05 ENCOUNTER — Other Ambulatory Visit: Admission: RE | Admit: 2020-03-05 | Payer: Medicare Other | Source: Ambulatory Visit

## 2020-04-01 DIAGNOSIS — J3089 Other allergic rhinitis: Secondary | ICD-10-CM | POA: Diagnosis not present

## 2020-04-01 DIAGNOSIS — H2511 Age-related nuclear cataract, right eye: Secondary | ICD-10-CM | POA: Diagnosis not present

## 2020-04-14 ENCOUNTER — Other Ambulatory Visit: Payer: Self-pay

## 2020-04-14 ENCOUNTER — Encounter: Payer: Self-pay | Admitting: Ophthalmology

## 2020-04-16 ENCOUNTER — Other Ambulatory Visit
Admission: RE | Admit: 2020-04-16 | Discharge: 2020-04-16 | Disposition: A | Discharge status: Home / Self Care | Payer: Medicare Other | Source: Ambulatory Visit | Attending: Ophthalmology | Admitting: Ophthalmology

## 2020-04-16 ENCOUNTER — Other Ambulatory Visit: Payer: Self-pay

## 2020-04-16 DIAGNOSIS — Z20822 Contact with and (suspected) exposure to covid-19: Secondary | ICD-10-CM | POA: Insufficient documentation

## 2020-04-16 DIAGNOSIS — Z01812 Encounter for preprocedural laboratory examination: Secondary | ICD-10-CM | POA: Diagnosis not present

## 2020-04-17 LAB — SARS CORONAVIRUS 2 (TAT 6-24 HRS): SARS Coronavirus 2: NEGATIVE

## 2020-04-19 NOTE — Discharge Instructions (Signed)

## 2020-04-20 ENCOUNTER — Ambulatory Visit: Payer: Medicare Other | Admitting: Anesthesiology

## 2020-04-20 ENCOUNTER — Ambulatory Visit
Admission: RE | Admit: 2020-04-20 | Discharge: 2020-04-20 | Disposition: A | Discharge status: Home / Self Care | Payer: Medicare Other | Attending: Ophthalmology | Admitting: Ophthalmology

## 2020-04-20 ENCOUNTER — Encounter: Payer: Self-pay | Admitting: Ophthalmology

## 2020-04-20 ENCOUNTER — Encounter
Admission: RE | Disposition: A | Discharge status: Home / Self Care | Payer: Self-pay | Source: Home / Self Care | Attending: Ophthalmology

## 2020-04-20 ENCOUNTER — Other Ambulatory Visit: Payer: Self-pay

## 2020-04-20 DIAGNOSIS — G510 Bell's palsy: Secondary | ICD-10-CM | POA: Diagnosis not present

## 2020-04-20 DIAGNOSIS — Z87891 Personal history of nicotine dependence: Secondary | ICD-10-CM | POA: Diagnosis not present

## 2020-04-20 DIAGNOSIS — H25811 Combined forms of age-related cataract, right eye: Secondary | ICD-10-CM | POA: Diagnosis not present

## 2020-04-20 DIAGNOSIS — H2511 Age-related nuclear cataract, right eye: Secondary | ICD-10-CM | POA: Insufficient documentation

## 2020-04-20 HISTORY — PX: CATARACT EXTRACTION W/PHACO: SHX586

## 2020-04-20 SURGERY — PHACOEMULSIFICATION, CATARACT, WITH IOL INSERTION
Anesthesia: Monitor Anesthesia Care | Site: Eye | Laterality: Right

## 2020-04-20 MED ORDER — EPINEPHRINE PF 1 MG/ML IJ SOLN
INTRAOCULAR | Status: DC | PRN
  Administered 2020-04-20: 53 mL via OPHTHALMIC

## 2020-04-20 MED ORDER — NA CHONDROIT SULF-NA HYALURON 40-17 MG/ML IO SOLN
INTRAOCULAR | Status: DC | PRN
  Administered 2020-04-20: 1 mL via INTRAOCULAR

## 2020-04-20 MED ORDER — MOXIFLOXACIN HCL 0.5 % OP SOLN
OPHTHALMIC | Status: DC | PRN
  Administered 2020-04-20: 0.2 mL via OPHTHALMIC

## 2020-04-20 MED ORDER — LIDOCAINE HCL (PF) 2 % IJ SOLN
INTRAOCULAR | Status: DC | PRN
  Administered 2020-04-20: 2 mL

## 2020-04-20 MED ORDER — MIDAZOLAM HCL 2 MG/2ML IJ SOLN
INTRAMUSCULAR | Status: DC | PRN
  Administered 2020-04-20: 1 mg via INTRAVENOUS

## 2020-04-20 MED ORDER — FENTANYL CITRATE (PF) 100 MCG/2ML IJ SOLN
INTRAMUSCULAR | Status: DC | PRN
Start: 2020-04-20 — End: 2020-04-20
  Administered 2020-04-20: 50 ug via INTRAVENOUS

## 2020-04-20 MED ORDER — ARMC OPHTHALMIC DILATING DROPS
1.0000 "application " | OPHTHALMIC | Status: DC | PRN
  Administered 2020-04-20 (×3): 1 via OPHTHALMIC

## 2020-04-20 MED ORDER — TETRACAINE HCL 0.5 % OP SOLN
1.0000 [drp] | OPHTHALMIC | Status: DC | PRN
  Administered 2020-04-20 (×3): 1 [drp] via OPHTHALMIC

## 2020-04-20 MED ORDER — BRIMONIDINE TARTRATE-TIMOLOL 0.2-0.5 % OP SOLN
OPHTHALMIC | Status: DC | PRN
  Administered 2020-04-20: 1 [drp] via OPHTHALMIC

## 2020-04-20 SURGICAL SUPPLY — 19 items
CANNULA ANT/CHMB 27G (MISCELLANEOUS) ×2 IMPLANT
CANNULA ANT/CHMB 27GA (MISCELLANEOUS) ×6 IMPLANT
GLOVE SURG LX 8.0 MICRO (GLOVE) ×2
GLOVE SURG LX STRL 8.0 MICRO (GLOVE) ×1 IMPLANT
GLOVE SURG TRIUMPH 8.0 PF LTX (GLOVE) ×3 IMPLANT
GOWN STRL REUS W/ TWL LRG LVL3 (GOWN DISPOSABLE) ×2 IMPLANT
GOWN STRL REUS W/TWL LRG LVL3 (GOWN DISPOSABLE) ×6
LENS IOL TECNIS EYHANCE 19.0 ×2 IMPLANT
MARKER SKIN DUAL TIP RULER LAB (MISCELLANEOUS) ×3 IMPLANT
NDL FILTER BLUNT 18X1 1/2 (NEEDLE) ×1 IMPLANT
NEEDLE FILTER BLUNT 18X 1/2SAF (NEEDLE) ×2
NEEDLE FILTER BLUNT 18X1 1/2 (NEEDLE) ×1 IMPLANT
PACK EYE AFTER SURG (MISCELLANEOUS) ×3 IMPLANT
PACK OPTHALMIC (MISCELLANEOUS) ×3 IMPLANT
PACK PORFILIO (MISCELLANEOUS) ×3 IMPLANT
SYR 3ML LL SCALE MARK (SYRINGE) ×3 IMPLANT
SYR TB 1ML LUER SLIP (SYRINGE) ×3 IMPLANT
WATER STERILE IRR 250ML POUR (IV SOLUTION) ×3 IMPLANT
WIPE NON LINTING 3.25X3.25 (MISCELLANEOUS) ×3 IMPLANT

## 2020-04-20 NOTE — Anesthesia Procedure Notes (Signed)
Procedure Name: MAC Date/Time: 04/20/2020 8:44 AM Performed by: Silvana Newness, CRNA Pre-anesthesia Checklist: Patient identified, Emergency Drugs available, Suction available, Patient being monitored and Timeout performed Patient Re-evaluated:Patient Re-evaluated prior to induction Oxygen Delivery Method: Nasal cannula Placement Confirmation: positive ETCO2

## 2020-04-20 NOTE — Anesthesia Postprocedure Evaluation (Signed)
Anesthesia Post Note  Patient: Timothy Wang  Procedure(s) Performed: CATARACT EXTRACTION PHACO AND INTRAOCULAR LENS PLACEMENT (IOC) RIGHT 11.14 00:53.9 (Right Eye)     Patient location during evaluation: PACU Anesthesia Type: MAC Level of consciousness: awake and alert Pain management: pain level controlled Vital Signs Assessment: post-procedure vital signs reviewed and stable Respiratory status: spontaneous breathing Cardiovascular status: blood pressure returned to baseline Postop Assessment: no apparent nausea or vomiting, adequate PO intake and no headache Anesthetic complications: no   No complications documented.  Adele Barthel Tayjon Halladay

## 2020-04-20 NOTE — Op Note (Signed)
PREOPERATIVE DIAGNOSIS:  Nuclear sclerotic cataract of the right eye.   POSTOPERATIVE DIAGNOSIS:  H25.11 Cataract   OPERATIVE PROCEDURE:@   SURGEON:  Birder Robson, MD.   ANESTHESIA:  Anesthesiologist: Page, Adele Barthel, MD CRNA: Silvana Newness, CRNA  1.      Managed anesthesia care. 2.      0.67ml of Shugarcaine was instilled in the eye following the paracentesis.   COMPLICATIONS:  None.   TECHNIQUE:   Stop and chop   DESCRIPTION OF PROCEDURE:  The patient was examined and consented in the preoperative holding area where the aforementioned topical anesthesia was applied to the right eye and then brought back to the Operating Room where the right eye was prepped and draped in the usual sterile ophthalmic fashion and a lid speculum was placed. A paracentesis was created with the side port blade and the anterior chamber was filled with viscoelastic. A near clear corneal incision was performed with the steel keratome. A continuous curvilinear capsulorrhexis was performed with a cystotome followed by the capsulorrhexis forceps. Hydrodissection and hydrodelineation were carried out with BSS on a blunt cannula. The lens was removed in a stop and chop  technique and the remaining cortical material was removed with the irrigation-aspiration handpiece. The capsular bag was inflated with viscoelastic and the Technis [id number]  lens was placed in the capsular bag without complication. The remaining viscoelastic was removed from the eye with the irrigation-aspiration handpiece. The wounds were hydrated. The anterior chamber was flushed with BSS and the eye was inflated to physiologic pressure. 0.30ml of Vigamox was placed in the anterior chamber. The wounds were found to be water tight. The eye was dressed with Combigan. The patient was given protective glasses to wear throughout the day and a shield with which to sleep tonight. The patient was also given drops with which to begin a drop regimen today and will  follow-up with me in one day. Implant Name Type Inv. Item Serial No. Manufacturer Lot No. LRB No. Used Action  LENS II EYHANCE 19.0 - [id number]  LENS II EYHANCE 19.0 [id number] JOHNSON   Right 1 Implanted   Procedure(s): CATARACT EXTRACTION PHACO AND INTRAOCULAR LENS PLACEMENT (IOC) RIGHT 11.14 00:53.9 (Right)  Electronically signed: Birder Robson 04/20/2020 8:58 AM

## 2020-04-20 NOTE — Transfer of Care (Signed)
Immediate Anesthesia Transfer of Care Note  Patient: Timothy Wang  Procedure(s) Performed: CATARACT EXTRACTION PHACO AND INTRAOCULAR LENS PLACEMENT (IOC) RIGHT 11.14 00:53.9 (Right Eye)  Patient Location: PACU  Anesthesia Type: MAC  Level of Consciousness: awake, alert  and patient cooperative  Airway and Oxygen Therapy: Patient Spontanous Breathing and Patient connected to supplemental oxygen  Post-op Assessment: Post-op Vital signs reviewed, Patient's Cardiovascular Status Stable, Respiratory Function Stable, Patent Airway and No signs of Nausea or vomiting  Post-op Vital Signs: Reviewed and stable  Complications: No complications documented.

## 2020-04-20 NOTE — H&P (Signed)
All labs reviewed. Abnormal studies sent to patients PCP when indicated.  Previous H&P reviewed, patient examined, there are NO CHANGES.  Timothy Gurney Porfilio9/21/20218:36 AM

## 2020-04-20 NOTE — Anesthesia Preprocedure Evaluation (Signed)
Anesthesia Evaluation  Patient identified by MRN, date of birth, ID band Patient awake    History of Anesthesia Complications Negative for: history of anesthetic complications  Airway Mallampati: III  TM Distance: >3 FB Neck ROM: Full    Dental no notable dental hx.    Pulmonary former smoker,    Pulmonary exam normal        Cardiovascular Exercise Tolerance: Good negative cardio ROS Normal cardiovascular exam     Neuro/Psych negative neurological ROS     GI/Hepatic negative GI ROS, Neg liver ROS,   Endo/Other  negative endocrine ROS  Renal/GU negative Renal ROS     Musculoskeletal negative musculoskeletal ROS (+)   Abdominal   Peds  Hematology negative hematology ROS (+)   Anesthesia Other Findings   Reproductive/Obstetrics                             Anesthesia Physical Anesthesia Plan  ASA: I  Anesthesia Plan: MAC   Post-op Pain Management:    Induction: Intravenous  PONV Risk Score and Plan: 1 and Midazolam, TIVA and Treatment may vary due to age or medical condition  Airway Management Planned: Nasal Cannula and Natural Airway  Additional Equipment: None  Intra-op Plan:   Post-operative Plan:   Informed Consent: I have reviewed the patients History and Physical, chart, labs and discussed the procedure including the risks, benefits and alternatives for the proposed anesthesia with the patient or authorized representative who has indicated his/her understanding and acceptance.       Plan Discussed with: CRNA  Anesthesia Plan Comments:         Anesthesia Quick Evaluation

## 2020-04-21 ENCOUNTER — Encounter: Payer: Self-pay | Admitting: Ophthalmology

## 2020-04-21 DIAGNOSIS — Z23 Encounter for immunization: Secondary | ICD-10-CM | POA: Diagnosis not present

## 2020-04-30 ENCOUNTER — Encounter: Payer: Self-pay | Admitting: Ophthalmology

## 2020-05-04 DIAGNOSIS — H2512 Age-related nuclear cataract, left eye: Secondary | ICD-10-CM | POA: Diagnosis not present

## 2020-05-07 ENCOUNTER — Other Ambulatory Visit
Admission: RE | Admit: 2020-05-07 | Discharge: 2020-05-07 | Disposition: A | Discharge status: Home / Self Care | Payer: Medicare Other | Source: Ambulatory Visit | Attending: Ophthalmology | Admitting: Ophthalmology

## 2020-05-07 ENCOUNTER — Other Ambulatory Visit: Payer: Self-pay

## 2020-05-07 DIAGNOSIS — Z20822 Contact with and (suspected) exposure to covid-19: Secondary | ICD-10-CM | POA: Diagnosis not present

## 2020-05-07 DIAGNOSIS — Z01812 Encounter for preprocedural laboratory examination: Secondary | ICD-10-CM | POA: Diagnosis not present

## 2020-05-08 LAB — SARS CORONAVIRUS 2 (TAT 6-24 HRS): SARS Coronavirus 2: NEGATIVE

## 2020-05-10 NOTE — Discharge Instructions (Signed)

## 2020-05-11 ENCOUNTER — Other Ambulatory Visit: Payer: Self-pay

## 2020-05-11 ENCOUNTER — Ambulatory Visit
Admission: RE | Admit: 2020-05-11 | Discharge: 2020-05-11 | Disposition: A | Discharge status: Home / Self Care | Payer: Medicare Other | Attending: Ophthalmology | Admitting: Ophthalmology

## 2020-05-11 ENCOUNTER — Ambulatory Visit: Payer: Medicare Other | Admitting: Anesthesiology

## 2020-05-11 ENCOUNTER — Encounter: Payer: Self-pay | Admitting: Ophthalmology

## 2020-05-11 ENCOUNTER — Encounter
Admission: RE | Disposition: A | Discharge status: Home / Self Care | Payer: Self-pay | Source: Home / Self Care | Attending: Ophthalmology

## 2020-05-11 DIAGNOSIS — Z87891 Personal history of nicotine dependence: Secondary | ICD-10-CM | POA: Insufficient documentation

## 2020-05-11 DIAGNOSIS — Z9841 Cataract extraction status, right eye: Secondary | ICD-10-CM | POA: Diagnosis not present

## 2020-05-11 DIAGNOSIS — Z79899 Other long term (current) drug therapy: Secondary | ICD-10-CM | POA: Insufficient documentation

## 2020-05-11 DIAGNOSIS — H2512 Age-related nuclear cataract, left eye: Secondary | ICD-10-CM | POA: Insufficient documentation

## 2020-05-11 DIAGNOSIS — J301 Allergic rhinitis due to pollen: Secondary | ICD-10-CM | POA: Diagnosis not present

## 2020-05-11 DIAGNOSIS — Z961 Presence of intraocular lens: Secondary | ICD-10-CM | POA: Diagnosis not present

## 2020-05-11 DIAGNOSIS — Z7982 Long term (current) use of aspirin: Secondary | ICD-10-CM | POA: Insufficient documentation

## 2020-05-11 DIAGNOSIS — K219 Gastro-esophageal reflux disease without esophagitis: Secondary | ICD-10-CM | POA: Insufficient documentation

## 2020-05-11 DIAGNOSIS — I10 Essential (primary) hypertension: Secondary | ICD-10-CM | POA: Insufficient documentation

## 2020-05-11 DIAGNOSIS — H25812 Combined forms of age-related cataract, left eye: Secondary | ICD-10-CM | POA: Diagnosis not present

## 2020-05-11 HISTORY — PX: CATARACT EXTRACTION W/PHACO: SHX586

## 2020-05-11 SURGERY — PHACOEMULSIFICATION, CATARACT, WITH IOL INSERTION
Anesthesia: Monitor Anesthesia Care | Site: Eye | Laterality: Left

## 2020-05-11 MED ORDER — ARMC OPHTHALMIC DILATING DROPS
1.0000 "application " | OPHTHALMIC | Status: DC | PRN
  Administered 2020-05-11 (×3): 1 via OPHTHALMIC

## 2020-05-11 MED ORDER — LACTATED RINGERS IV SOLN: INTRAVENOUS | Status: DC

## 2020-05-11 MED ORDER — TETRACAINE HCL 0.5 % OP SOLN
1.0000 [drp] | OPHTHALMIC | Status: DC | PRN
  Administered 2020-05-11 (×3): 1 [drp] via OPHTHALMIC

## 2020-05-11 MED ORDER — BRIMONIDINE TARTRATE-TIMOLOL 0.2-0.5 % OP SOLN
OPHTHALMIC | Status: DC | PRN
  Administered 2020-05-11: 1 [drp] via OPHTHALMIC

## 2020-05-11 MED ORDER — ACETAMINOPHEN 10 MG/ML IV SOLN: 1000.0000 mg | Freq: Once | INTRAVENOUS | Status: DC | PRN

## 2020-05-11 MED ORDER — NA CHONDROIT SULF-NA HYALURON 40-17 MG/ML IO SOLN
INTRAOCULAR | Status: DC | PRN
  Administered 2020-05-11: 1 mL via INTRAOCULAR

## 2020-05-11 MED ORDER — EPINEPHRINE PF 1 MG/ML IJ SOLN
INTRAOCULAR | Status: DC | PRN
  Administered 2020-05-11: 73 mL via OPHTHALMIC

## 2020-05-11 MED ORDER — LIDOCAINE HCL (PF) 2 % IJ SOLN
INTRAOCULAR | Status: DC | PRN
  Administered 2020-05-11: 2 mL

## 2020-05-11 MED ORDER — MOXIFLOXACIN HCL 0.5 % OP SOLN
OPHTHALMIC | Status: DC | PRN
  Administered 2020-05-11: 0.2 mL via OPHTHALMIC

## 2020-05-11 MED ORDER — MIDAZOLAM HCL 2 MG/2ML IJ SOLN
INTRAMUSCULAR | Status: DC | PRN
  Administered 2020-05-11 (×2): 1 mg via INTRAVENOUS

## 2020-05-11 MED ORDER — FENTANYL CITRATE (PF) 100 MCG/2ML IJ SOLN
INTRAMUSCULAR | Status: DC | PRN
  Administered 2020-05-11 (×2): 50 ug via INTRAVENOUS

## 2020-05-11 MED ORDER — ONDANSETRON HCL 4 MG/2ML IJ SOLN: 4.0000 mg | Freq: Once | INTRAMUSCULAR | Status: DC | PRN

## 2020-05-11 SURGICAL SUPPLY — 19 items
CANNULA ANT/CHMB 27G (MISCELLANEOUS) ×2 IMPLANT
CANNULA ANT/CHMB 27GA (MISCELLANEOUS) ×6 IMPLANT
GLOVE SURG LX 8.0 MICRO (GLOVE) ×2
GLOVE SURG LX STRL 8.0 MICRO (GLOVE) ×1 IMPLANT
GLOVE SURG TRIUMPH 8.0 PF LTX (GLOVE) ×3 IMPLANT
GOWN STRL REUS W/ TWL LRG LVL3 (GOWN DISPOSABLE) ×2 IMPLANT
GOWN STRL REUS W/TWL LRG LVL3 (GOWN DISPOSABLE) ×6
LENS IOL TECNIS EYHANCE 20.0 (Intraocular Lens) ×2 IMPLANT
MARKER SKIN DUAL TIP RULER LAB (MISCELLANEOUS) ×3 IMPLANT
NDL FILTER BLUNT 18X1 1/2 (NEEDLE) ×1 IMPLANT
NEEDLE FILTER BLUNT 18X 1/2SAF (NEEDLE) ×2
NEEDLE FILTER BLUNT 18X1 1/2 (NEEDLE) ×1 IMPLANT
PACK EYE AFTER SURG (MISCELLANEOUS) ×3 IMPLANT
PACK OPTHALMIC (MISCELLANEOUS) ×3 IMPLANT
PACK PORFILIO (MISCELLANEOUS) ×3 IMPLANT
SYR 3ML LL SCALE MARK (SYRINGE) ×3 IMPLANT
SYR TB 1ML LUER SLIP (SYRINGE) ×3 IMPLANT
WATER STERILE IRR 250ML POUR (IV SOLUTION) ×3 IMPLANT
WIPE NON LINTING 3.25X3.25 (MISCELLANEOUS) ×3 IMPLANT

## 2020-05-11 NOTE — Transfer of Care (Signed)
Immediate Anesthesia Transfer of Care Note  Patient: Timothy Wang  Procedure(s) Performed: CATARACT EXTRACTION PHACO AND INTRAOCULAR LENS PLACEMENT (IOC) LEFT 4.98 00:28.8  (Left Eye)  Patient Location: PACU  Anesthesia Type: MAC  Level of Consciousness: awake, alert  and patient cooperative  Airway and Oxygen Therapy: Patient Spontanous Breathing and Patient connected to supplemental oxygen  Post-op Assessment: Post-op Vital signs reviewed, Patient's Cardiovascular Status Stable, Respiratory Function Stable, Patent Airway and No signs of Nausea or vomiting  Post-op Vital Signs: Reviewed and stable  Complications: No complications documented.

## 2020-05-11 NOTE — Anesthesia Postprocedure Evaluation (Signed)
Anesthesia Post Note  Patient: Dora Clauss  Procedure(s) Performed: CATARACT EXTRACTION PHACO AND INTRAOCULAR LENS PLACEMENT (IOC) LEFT 4.98 00:28.8  (Left Eye)     Patient location during evaluation: PACU Anesthesia Type: MAC Level of consciousness: awake and alert Pain management: pain level controlled Vital Signs Assessment: post-procedure vital signs reviewed and stable Respiratory status: spontaneous breathing, nonlabored ventilation, respiratory function stable and patient connected to nasal cannula oxygen Cardiovascular status: stable and blood pressure returned to baseline Postop Assessment: no apparent nausea or vomiting Anesthetic complications: no   No complications documented.  Eri Platten A  Brenden Rudman

## 2020-05-11 NOTE — Anesthesia Preprocedure Evaluation (Signed)
Anesthesia Evaluation  Patient identified by MRN, date of birth, ID band Patient awake    Reviewed: Allergy & Precautions, NPO status , Patient's Chart, lab work & pertinent test results, reviewed documented beta blocker date and time   History of Anesthesia Complications Negative for: history of anesthetic complications  Airway Mallampati: III  TM Distance: >3 FB Neck ROM: Full    Dental no notable dental hx.    Pulmonary former smoker,    Pulmonary exam normal breath sounds clear to auscultation       Cardiovascular Exercise Tolerance: Good hypertension, (-) angina(-) DOE Normal cardiovascular exam Rhythm:Regular Rate:Normal   HLD   Neuro/Psych PSYCHIATRIC DISORDERS Depression  H/o Bell's palsy    GI/Hepatic GERD  Controlled,  Endo/Other    Renal/GU      Musculoskeletal   Abdominal (+) + obese (BMI 31),   Peds  Hematology   Anesthesia Other Findings   Reproductive/Obstetrics                             Anesthesia Physical  Anesthesia Plan  ASA: II  Anesthesia Plan: MAC   Post-op Pain Management:    Induction: Intravenous  PONV Risk Score and Plan: 1 and Midazolam, TIVA and Treatment may vary due to age or medical condition  Airway Management Planned: Nasal Cannula and Natural Airway  Additional Equipment: None  Intra-op Plan:   Post-operative Plan:   Informed Consent: I have reviewed the patients History and Physical, chart, labs and discussed the procedure including the risks, benefits and alternatives for the proposed anesthesia with the patient or authorized representative who has indicated his/her understanding and acceptance.       Plan Discussed with: CRNA  Anesthesia Plan Comments:         Anesthesia Quick Evaluation

## 2020-05-11 NOTE — H&P (Signed)
[facility]   Primary Care Physician:  Owens Loffler, MD Ophthalmologist: Dr. George Ina  Pre-Procedure History & Physical: HPI:  Timothy Wang is a 69 y.o. male here for cataract surgery.   Past Medical History:  Diagnosis Date  . Allergic rhinitis due to pollen   . Cataract 2020   right eye  . Depression   . GERD (gastroesophageal reflux disease)   . H/O: Bell's palsy   . Hyperlipidemia   . Hypertension   . Ventral hernia     Past Surgical History:  Procedure Laterality Date  . CATARACT EXTRACTION W/PHACO Right 04/20/2020   Procedure: CATARACT EXTRACTION PHACO AND INTRAOCULAR LENS PLACEMENT (IOC) RIGHT 11.14 00:53.9;  Surgeon: Birder Robson, MD;  Location: [place];  Service: Ophthalmology;  Laterality: Right;  . COLONOSCOPY    . [place]  . KNEE SURGERY  2015  . TONSILLECTOMY  1960  . VENTRAL HERNIA REPAIR N/A 11/03/2016   Procedure: HERNIA REPAIR VENTRAL ADULT WITH MESH;  Surgeon: Clayburn Pert, MD;  Location: [facility];  Service: General;  Laterality: N/A;    Prior to Admission medications   Medication Sig Start Date End Date Taking? Authorizing Provider  acyclovir (ZOVIRAX) 400 MG tablet Take 400 mg by mouth 5 (five) times daily.   Yes [provider]  aspirin 325 MG EC tablet Take 325 mg by mouth. Every other day   Yes [provider]  famotidine (PEPCID) 10 MG tablet Take 10 mg by mouth daily as needed for indigestion.    Yes [provider]  fluticasone (FLONASE) 50 MCG/ACT nasal spray Place 2 sprays into both nostrils daily as needed for allergies or rhinitis.   Yes [provider]  ibuprofen (ADVIL,MOTRIN) 200 MG tablet Take 200 mg by mouth daily as needed.   Yes [provider]  naproxen sodium (ANAPROX) 220 MG tablet Take 220 mg by mouth daily as needed.   Yes [provider]    Allergies as of 04/30/2020  . (No Known Allergies)    Family History  Problem Relation Age  of Onset  . Pancreatic cancer Mother   . Alcohol abuse Father   . Heart attack Father   . Thyroid disease Sister   . Depression Sister   . Cancer Sister        breast    Social History   Socioeconomic History  . Marital status: Single    Spouse name: Not on file  . Number of children: Not on file  . Years of education: Not on file  . Highest education level: Not on file  Occupational History  . Occupation: Scientist, clinical (histocompatibility and immunogenetics): [employer]  Tobacco Use  . Smoking status: Former Smoker    Packs/day: 1.00    Years: 10.00    Pack years: 10.00    Types: Cigarettes    Quit date: 10/27/2002    Years since quitting: 17.5  . Smokeless tobacco: Never Used  . Tobacco comment: quit 2004  Vaping Use  . Vaping Use: Never used  Substance and Sexual Activity  . Alcohol use: Yes    Comment: Occassionally beer and wine  . Drug use: No  . Sexual activity: Not on file  Other Topics Concern  . Not on file  Social History Narrative  . Not on file   Social Determinants of Health   Financial Resource Strain:   . Difficulty of Paying Living Expenses: Not on file  Food Insecurity:   .  Worried About Charity fundraiser in the Last Year: Not on file  . Ran Out of Food in the Last Year: Not on file  Transportation Needs:   . Lack of Transportation (Medical): Not on file  . Lack of Transportation (Non-Medical): Not on file  Physical Activity:   . Days of Exercise per Week: Not on file  . Minutes of Exercise per Session: Not on file  Stress:   . Feeling of Stress : Not on file  Social Connections:   . Frequency of Communication with Friends and Family: Not on file  . Frequency of Social Gatherings with Friends and Family: Not on file  . Attends Religious Services: Not on file  . Active Member of Clubs or Organizations: Not on file  . Attends Archivist Meetings: Not on file  . Marital Status: Not on file  Intimate Partner Violence:   . Fear of Current or Ex-Partner: Not on  file  . Emotionally Abused: Not on file  . Physically Abused: Not on file  . Sexually Abused: Not on file    Review of Systems: See HPI, otherwise negative ROS  Physical Exam: BP (!) 135/98   Pulse 87   Temp 97.8 F (36.6 C) (Temporal)   Resp 16   Ht 6' (1.829 m)   Wt 103.9 kg   BMI 31.06 kg/m  General:   Alert,  pleasant and cooperative in NAD Head:  Normocephalic and atraumatic. Lungs:  Clear to auscultation.    Heart:  Regular rate and rhythm.   Impression/Plan: Timothy Wang is here for cataract surgery.  Risks, benefits, limitations, and alternatives regarding cataract surgery have been reviewed with the patient.  Questions have been answered.  All parties agreeable.   Birder Robson, MD  05/11/2020, 12:30 PM

## 2020-05-11 NOTE — Anesthesia Procedure Notes (Signed)
Procedure Name: MAC Date/Time: 05/11/2020 12:39 PM Performed by: Cameron Ali, CRNA Pre-anesthesia Checklist: Patient identified, Emergency Drugs available, Suction available, Timeout performed and Patient being monitored Patient Re-evaluated:Patient Re-evaluated prior to induction Oxygen Delivery Method: Nasal cannula Placement Confirmation: positive ETCO2

## 2020-05-11 NOTE — Op Note (Signed)
PREOPERATIVE DIAGNOSIS:  Nuclear sclerotic cataract of the left eye.   POSTOPERATIVE DIAGNOSIS:  Nuclear sclerotic cataract of the left eye.   OPERATIVE PROCEDURE:@   SURGEON:  Birder Robson, MD.   ANESTHESIA:  Anesthesiologist: Heniser, Fredric Dine, MD CRNA: Cameron Ali, CRNA  1.      Managed anesthesia care. 2.     0.34ml of Shugarcaine was instilled following the paracentesis   COMPLICATIONS:  None.   TECHNIQUE:   Stop and chop   DESCRIPTION OF PROCEDURE:  The patient was examined and consented in the preoperative holding area where the aforementioned topical anesthesia was applied to the left eye and then brought back to the Operating Room where the left eye was prepped and draped in the usual sterile ophthalmic fashion and a lid speculum was placed. A paracentesis was created with the side port blade and the anterior chamber was filled with viscoelastic. A near clear corneal incision was performed with the steel keratome. A continuous curvilinear capsulorrhexis was performed with a cystotome followed by the capsulorrhexis forceps. Hydrodissection and hydrodelineation were carried out with BSS on a blunt cannula. The lens was removed in a stop and chop  technique and the remaining cortical material was removed with the irrigation-aspiration handpiece. The capsular bag was inflated with viscoelastic and the Technis ZCB00 lens was placed in the capsular bag without complication. The remaining viscoelastic was removed from the eye with the irrigation-aspiration handpiece. The wounds were hydrated. The anterior chamber was flushed with BSS and the eye was inflated to physiologic pressure. 0.54ml Vigamox was placed in the anterior chamber. The wounds were found to be water tight. The eye was dressed with Combigan. The patient was given protective glasses to wear throughout the day and a shield with which to sleep tonight. The patient was also given drops with which to begin a drop regimen today and  will follow-up with me in one day. Implant Name Type Inv. Item Serial No. Manufacturer Lot No. LRB No. Used Action  LENS II EYHANCE 20.0 - [id number] Intraocular Lens LENS II EYHANCE 20.0 [id number] JOHNSON   Left 1 Implanted    Procedure(s): CATARACT EXTRACTION PHACO AND INTRAOCULAR LENS PLACEMENT (IOC) LEFT 4.98 00:28.8  (Left)  Electronically signed: Birder Robson 05/11/2020 12:53 PM

## 2020-05-12 ENCOUNTER — Encounter: Payer: Self-pay | Admitting: Ophthalmology

## 2020-07-06 DIAGNOSIS — Z23 Encounter for immunization: Secondary | ICD-10-CM | POA: Diagnosis not present

## 2020-10-22 DIAGNOSIS — H353131 Nonexudative age-related macular degeneration, bilateral, early dry stage: Secondary | ICD-10-CM | POA: Diagnosis not present

## 2021-02-24 ENCOUNTER — Other Ambulatory Visit: Payer: Self-pay

## 2021-02-24 ENCOUNTER — Ambulatory Visit (INDEPENDENT_AMBULATORY_CARE_PROVIDER_SITE_OTHER): Payer: Medicare Other | Admitting: Family Medicine

## 2021-02-24 VITALS — BP 130/80 | HR 79 | Temp 98.1°F | Ht 69.5 in | Wt 225.2 lb

## 2021-02-24 DIAGNOSIS — M7661 Achilles tendinitis, right leg: Secondary | ICD-10-CM

## 2021-02-24 NOTE — Patient Instructions (Addendum)
Achilles Tendon Rehab  Begin with easy walking, heel, toe and backwards  Start: Calf raises while seated First lower and then raise on both feett Assist lifting with hands and then slowly lower  Begin with 3 sets of 10 repetitions Increase by 5 repetitions every 3 days  Goal is 3 sets of 30 repetitions  If feels good at 3 sets of 30 - add backpack with 5 lbs Increase by 5 lbs per week to max of 30 lbs   If this is easy, then change to calf lowers standing on a step.  A heel cup of any brand will elevate the heel and take stress off of the achilles tendon.

## 2021-02-24 NOTE — Progress Notes (Signed)
[name], MD, [place] at [facility] [place], [address]  Phone: [phone]  FAX: [phone]  Timothy Wang - 70 y.o. male  MRN [id number]  Date of Birth: Jun 14, 1951  Date: 02/24/2021  PCP: Owens Loffler, MD  Referral: Owens Loffler, MD  Chief Complaint  Patient presents with   Tendonitis    Right Heel    This visit occurred during the SARS-CoV-2 public health emergency.  Safety protocols were in place, including screening questions prior to the visit, additional usage of staff PPE, and extensive cleaning of exam room while observing appropriate contact time as indicated for disinfecting solutions.   Subjective:   Pleasant patient who presents with a > 1 month history of posterior heel pain.  No occult, abrupt onset. Has been more insidious in character. There is a dull ache present and worse with activity:  R ankle pain and was plaing some pickleball, with walking will hurt.  Recall a specific acute event at that time.  Ice packs, foot massag will help.  Not doing those every day.  Sometimes advil or alleve.   Felt a pull in his hip flexor. And also some in the l knee.   Prior home rehab: Minimal Prior meds: Tylenol, Aleve, ice Orthosis / Braces: none   Review of Systems is noted in the HPI, as appropriate  Objective:   Blood pressure 130/80, pulse 79, temperature 98.1 F (36.7 C), temperature source Temporal, height 5' 9.5" (1.765 m), weight 225 lb 4 oz (102.2 kg), SpO2 94 %.   Foot: R Echymosis: no Edema: no ROM: full LE B Gait: heel toe, non-antalgic MT pain: no Callus pattern: none Lateral Mall: NT Medial Mall: NT Talus: NT Navicular: NT Cuboid: NT Calcaneous: NT Metatarsals: NT 5th MT: NT Phalanges: NT Achilles: PAINFUL TO PALPATE AT INSERTION ON RIGHT, SMALL NODULE Plantar Fascia: NT Fat Pad: NT Peroneals: NT Post Tib: NT Great Toe: Nml motion Ant Drawer:  neg ATFL: NT CFL: NT Deltoid: NT Sensation: intact  Radiology: No results found.  Assessment and Plan:     ICD-10-CM   1. Achilles tendinitis of right lower extremity  M76.61      Classic.  Heel cups.  Start eccentric protocol.  Pathophysiology of achilles tendinopathy reviewed.  Additionally, I have given the patient the program emphasizing eccentric overloading detailed in the instructions based on Dr. Trudi Ida work and protocols.  Supportive footwear reviewed.   Patient Instructions  Achilles Tendon Rehab  Begin with easy walking, heel, toe and backwards  Start: Calf raises while seated First lower and then raise on both feett Assist lifting with hands and then slowly lower  Begin with 3 sets of 10 repetitions Increase by 5 repetitions every 3 days  Goal is 3 sets of 30 repetitions  If feels good at 3 sets of 30 - add backpack with 5 lbs Increase by 5 lbs per week to max of 30 lbs   If this is easy, then change to calf lowers standing on a step.  A heel cup of any brand will elevate the heel and take stress off of the achilles tendon.   Signed,  Maud Deed. [name], MD   Outpatient Encounter Medications as of 02/24/2021  Medication Sig   acyclovir (ZOVIRAX) 400 MG tablet Take 400 mg by mouth 5 (five) times daily.   aspirin 325 MG EC tablet Take 325 mg by mouth. Every other day   famotidine (PEPCID) 10  MG tablet Take 10 mg by mouth daily as needed for indigestion.    fluticasone (FLONASE) 50 MCG/ACT nasal spray Place 2 sprays into both nostrils daily as needed for allergies or rhinitis.   ibuprofen (ADVIL,MOTRIN) 200 MG tablet Take 200 mg by mouth daily as needed.   naproxen sodium (ANAPROX) 220 MG tablet Take 220 mg by mouth daily as needed.   No facility-administered encounter medications on file as of 02/24/2021.

## 2021-02-27 ENCOUNTER — Encounter: Payer: Self-pay | Admitting: Family Medicine

## 2021-03-01 ENCOUNTER — Other Ambulatory Visit: Payer: Self-pay | Admitting: Family Medicine

## 2021-03-01 DIAGNOSIS — Z125 Encounter for screening for malignant neoplasm of prostate: Secondary | ICD-10-CM

## 2021-03-01 DIAGNOSIS — E7800 Pure hypercholesterolemia, unspecified: Secondary | ICD-10-CM

## 2021-03-01 DIAGNOSIS — Z79899 Other long term (current) drug therapy: Secondary | ICD-10-CM

## 2021-03-01 DIAGNOSIS — R7309 Other abnormal glucose: Secondary | ICD-10-CM

## 2021-03-02 ENCOUNTER — Other Ambulatory Visit: Payer: Self-pay

## 2021-03-02 ENCOUNTER — Other Ambulatory Visit (INDEPENDENT_AMBULATORY_CARE_PROVIDER_SITE_OTHER): Payer: Medicare Other

## 2021-03-02 DIAGNOSIS — Z125 Encounter for screening for malignant neoplasm of prostate: Secondary | ICD-10-CM | POA: Diagnosis not present

## 2021-03-02 DIAGNOSIS — E7800 Pure hypercholesterolemia, unspecified: Secondary | ICD-10-CM

## 2021-03-02 DIAGNOSIS — R7309 Other abnormal glucose: Secondary | ICD-10-CM | POA: Diagnosis not present

## 2021-03-02 DIAGNOSIS — Z79899 Other long term (current) drug therapy: Secondary | ICD-10-CM | POA: Diagnosis not present

## 2021-03-02 LAB — CBC WITH DIFFERENTIAL/PLATELET
Basophils Absolute: 0 10*3/uL (ref 0.0–0.1)
Basophils Relative: 1 % (ref 0.0–3.0)
Eosinophils Absolute: 0.1 10*3/uL (ref 0.0–0.7)
Eosinophils Relative: 1.9 % (ref 0.0–5.0)
HCT: 46.3 % (ref 39.0–52.0)
Hemoglobin: 15.4 g/dL (ref 13.0–17.0)
Lymphocytes Relative: 18.4 % (ref 12.0–46.0)
Lymphs Abs: 0.9 10*3/uL (ref 0.7–4.0)
MCHC: 33.4 g/dL (ref 30.0–36.0)
MCV: 90 fl (ref 78.0–100.0)
Monocytes Absolute: 0.6 10*3/uL (ref 0.1–1.0)
Monocytes Relative: 13.6 % — ABNORMAL HIGH (ref 3.0–12.0)
Neutro Abs: 3 10*3/uL (ref 1.4–7.7)
Neutrophils Relative %: 65.1 % (ref 43.0–77.0)
Platelets: 243 10*3/uL (ref 150.0–400.0)
RBC: 5.14 Mil/uL (ref 4.22–5.81)
RDW: 13.9 % (ref 11.5–15.5)
WBC: 4.6 10*3/uL (ref 4.0–10.5)

## 2021-03-02 LAB — HEPATIC FUNCTION PANEL
ALT: 12 U/L (ref 0–53)
AST: 14 U/L (ref 0–37)
Albumin: 4.2 g/dL (ref 3.5–5.2)
Alkaline Phosphatase: 103 U/L (ref 39–117)
Bilirubin, Direct: 0.2 mg/dL (ref 0.0–0.3)
Total Bilirubin: 0.8 mg/dL (ref 0.2–1.2)
Total Protein: 6.9 g/dL (ref 6.0–8.3)

## 2021-03-02 LAB — BASIC METABOLIC PANEL
BUN: 11 mg/dL (ref 6–23)
CO2: 28 mEq/L (ref 19–32)
Calcium: 9.2 mg/dL (ref 8.4–10.5)
Chloride: 103 mEq/L (ref 96–112)
Creatinine, Ser: 0.74 mg/dL (ref 0.40–1.50)
GFR: 92.53 mL/min (ref >60.00)
Glucose, Bld: 83 mg/dL (ref 70–99)
Potassium: 4.3 mEq/L (ref 3.5–5.1)
Sodium: 140 mEq/L (ref 135–145)

## 2021-03-02 LAB — LIPID PANEL
Cholesterol: 175 mg/dL (ref 0–200)
HDL: 40.5 mg/dL (ref >39.00)
LDL Cholesterol: 123 mg/dL — ABNORMAL HIGH (ref 0–99)
NonHDL: 134.52
Total CHOL/HDL Ratio: 4
Triglycerides: 59 mg/dL (ref 0.0–149.0)
VLDL: 11.8 mg/dL (ref 0.0–40.0)

## 2021-03-02 LAB — HEMOGLOBIN A1C: Hgb A1c MFr Bld: 5.8 % (ref 4.6–6.5)

## 2021-03-02 LAB — PSA, MEDICARE: PSA: 0.48 ng/ml (ref 0.10–4.00)

## 2021-03-09 ENCOUNTER — Ambulatory Visit (INDEPENDENT_AMBULATORY_CARE_PROVIDER_SITE_OTHER): Payer: Medicare Other | Admitting: Family Medicine

## 2021-03-09 ENCOUNTER — Encounter: Payer: Self-pay | Admitting: Family Medicine

## 2021-03-09 ENCOUNTER — Other Ambulatory Visit: Payer: Self-pay

## 2021-03-09 VITALS — BP 120/80 | HR 88 | Temp 98.2°F | Ht 69.5 in | Wt 224.8 lb

## 2021-03-09 DIAGNOSIS — Z0000 Encounter for general adult medical examination without abnormal findings: Secondary | ICD-10-CM

## 2021-03-09 NOTE — Progress Notes (Signed)
Tevis Dunavan T. Mart Colpitts, MD, [place] at [facility] [place], [address]  Phone: [phone]  FAX: [phone]  Deran Abbasi - 70 y.o. male  MRN [id number]  Date of Birth: 14-Nov-1950  Date: 03/09/2021  PCP: Owens Loffler, MD  Referral: Owens Loffler, MD  Chief Complaint  Patient presents with   Medicare Wellness    This visit occurred during the SARS-CoV-2 public health emergency.  Safety protocols were in place, including screening questions prior to the visit, additional usage of staff PPE, and extensive cleaning of exam room while observing appropriate contact time as indicated for disinfecting solutions.   Patient Care Team: Owens Loffler, MD as PCP - General Subjective:   Chaquille Purdum is a 70 y.o. pleasant patient who presents for a medicare wellness examination:  Preventative Health Maintenance Visit:  Health Maintenance Summary Reviewed and updated, unless pt declines services.  Tobacco History Reviewed. Alcohol: No concerns, no excessive use Exercise Habits: He had been more active, but his heel pain is bothering him.  He started and tried some pickleball. STD concerns: no risk or activity to increase risk Drug Use: None  His Achilles tendinitis has been improving.  Feeling quite a bit better.  Health Maintenance  Topic Date Due   COVID-19 Vaccine (4 - Booster for Pfizer series) 10/04/2020   INFLUENZA VACCINE  02/28/2021   COLONOSCOPY (Pts 45-73yr Insurance coverage will need to be confirmed)  05/31/2022   TETANUS/TDAP  02/18/2023   Hepatitis C Screening  Completed   PNA vac Low Risk Adult  Completed   Zoster Vaccines- Shingrix  Completed   HPV VACCINES  Aged Out    Immunization History  Administered Date(s) Administered   Influenza Whole 04/10/2011   Influenza,inj,Quad PF,6+ Mos 04/30/2014, 06/05/2016, 05/24/2017, 10/09/2018   Influenza-Unspecified 04/18/2019, 04/21/2020    PFIZER(Purple Top)SARS-COV-2 Vaccination 09/20/2019, 10/14/2019, 07/06/2020   Pneumococcal Conjugate-13 05/24/2017   Pneumococcal Polysaccharide-23 10/09/2018   Tdap 02/17/2013   Zoster Recombinat (Shingrix) 04/18/2018, 08/08/2018   Zoster, Live 09/10/2014    Patient Active Problem List   Diagnosis Date Noted   Tendon rupture, traumatic. quadriceps, left, subsequent encounter 06/11/2014   Essential hypertension 08/04/2010   Hyperlipidemia 05/28/2008   Ventral hernia 05/28/2008   Dep[place], major, [place] 04/21/2008   ALLERGIC RHINITIS 04/21/2008    Past Medical History:  Diagnosis Date   Allergic rhinitis due to pollen    Cataract 2020   right eye   Depression    GERD (gastroesophageal reflux disease)    H/O: Bell's palsy    Hyperlipidemia    Hypertension    Ventral hernia     Past Surgical History:  Procedure Laterality Date   CATARACT EXTRACTION W/PHACO Right 04/20/2020   Procedure: CATARACT EXTRACTION PHACO AND INTRAOCULAR LENS PLACEMENT ([place] RIGHT 11.14 00:53.9;  Surgeon: PBirder Robson MD;  [place]  Service: Ophthalmology;  Laterality: Right;   CATARACT EXTRACTION W/PHACO Left 05/11/2020   Procedure: CATARACT EXTRACTION PHACO AND INTRAOCULAR LENS PLACEMENT (IOC) LEFT 4.98 00:28.8 ;  Surgeon: PBirder Robson MD;  [place]  Service: Ophthalmology;  Laterality: Left;   COLONOSCOPY     HEEL SPUR SURGERY  1987   KNEE SURGERY  2015   TONSILLECTOMY  1960   VENTRAL HERNIA REPAIR N/A 11/03/2016   Procedure: HERNIA REPAIR VENTRAL ADULT WITH MESH;  Surgeon: CClayburn Pert MD;  Location: [facility];  Service: General;  Laterality: N/A;    Family History  Problem Relation Age of Onset   Pancreatic cancer Mother    Alcohol abuse Father    Heart attack Father    Thyroid disease Sister    Depression Sister    Cancer Sister        breast    Past Medical History, Surgical History, Social History, Family History,  Problem List, Medications, and Allergies have been reviewed and updated if relevant.  Review of Systems: Pertinent positives are listed above.  Otherwise, a full 14 point review of systems has been done in full and it is negative except where it is noted positive.  Objective:   BP 120/80   Pulse 88   Temp 98.2 F (36.8 C) (Temporal)   Ht 5' 9.5" (1.765 m)   Wt 224 lb 12 oz (101.9 kg)   SpO2 97%   BMI 32.71 kg/m  Fall Risk  03/09/2021 10/13/2019 10/09/2018 09/05/2017  Falls in the past year? 0 0 0 No   Ideal Body Weight: Weight in (lb) to have BMI = 25: 171.4 Hearing Screening  Method: Audiometry   '500Hz'$  '1000Hz'$  '2000Hz'$  '4000Hz'$   Right ear '20 20 20 20  '$ Left ear '20 25 20 20  '$ Vision Screening - Comments:: Wear Glasses-Eye Exam with [facility] 05/2020 Depression screen [facility] 2/9 03/09/2021 10/13/2019 10/09/2018 09/05/2017  Decreased Interest 0 0 0 0  Down, Depressed, Hopeless 0 0 0 0  PHQ - 2 Score 0 0 0 0  Altered sleeping - 0 0 -  Tired, decreased energy - 0 0 -  Change in appetite - 0 0 -  Feeling bad or failure about yourself  - 0 0 -  Trouble concentrating - 0 0 -  Moving slowly or fidgety/restless - 0 0 -  Suicidal thoughts - 0 0 -  PHQ-9 Score - 0 0 -  Difficult doing work/chores - Not difficult at all Not difficult at all -     GEN: well developed, well nourished, no acute distress Eyes: conjunctiva and lids normal, PERRLA, EOMI ENT: TM clear, nares clear, oral exam WNL Neck: supple, no lymphadenopathy, no thyromegaly, no JVD Pulm: clear to auscultation and percussion, respiratory effort normal CV: regular rate and rhythm, S1-S2, no murmur, rub or gallop, no bruits, peripheral pulses normal and symmetric, no cyanosis, clubbing, edema or varicosities GI: soft, non-tender; no hepatosplenomegaly, masses; active bowel sounds all quadrants GU: deferred Lymph: no cervical, axillary or inguinal adenopathy MSK: gait normal, muscle tone and strength WNL, no joint swelling, effusions,  discoloration, crepitus  SKIN: clear, good turgor, color WNL, no rashes, lesions, or ulcerations Neuro: normal mental status, normal strength, sensation, and motion Psych: alert; oriented to person, place and time, normally interactive and not anxious or depressed in appearance.  All labs reviewed with patient.  Results for orders placed or performed in visit on 03/02/21  PSA, Medicare  Result Value Ref Range   PSA 0.48 0.10 - 4.00 ng/ml  Hemoglobin A1c  Result Value Ref Range   Hgb A1c MFr Bld 5.8 4.6 - 6.5 %  CBC with Differential/Platelet  Result Value Ref Range   WBC 4.6 4.0 - 10.5 K/uL   RBC 5.14 4.22 - 5.81 Mil/uL   Hemoglobin 15.4 13.0 - 17.0 g/dL   HCT 46.3 39.0 - 52.0 %   MCV 90.0 78.0 - 100.0 fl   MCHC 33.4 30.0 - 36.0 g/dL   RDW 13.9 11.5 - 15.5 %   Platelets 243.0 150.0 - 400.0 K/uL   Neutrophils Relative % 65.1 43.0 -  77.0 %   Lymphocytes Relative 18.4 12.0 - 46.0 %   Monocytes Relative 13.6 (H) 3.0 - 12.0 %   Eosinophils Relative 1.9 0.0 - 5.0 %   Basophils Relative 1.0 0.0 - 3.0 %   Neutro Abs 3.0 1.4 - 7.7 K/uL   Lymphs Abs 0.9 0.7 - 4.0 K/uL   Monocytes Absolute 0.6 0.1 - 1.0 K/uL   Eosinophils Absolute 0.1 0.0 - 0.7 K/uL   Basophils Absolute 0.0 0.0 - 0.1 K/uL  Basic metabolic panel  Result Value Ref Range   Sodium 140 135 - 145 mEq/L   Potassium 4.3 3.5 - 5.1 mEq/L   Chloride 103 96 - 112 mEq/L   CO2 28 19 - 32 mEq/L   Glucose, Bld 83 70 - 99 mg/dL   BUN 11 6 - 23 mg/dL   Creatinine, Ser 0.74 0.40 - 1.50 mg/dL   GFR 92.53 >60.00 mL/min   Calcium 9.2 8.4 - 10.5 mg/dL  Hepatic function panel  Result Value Ref Range   Total Bilirubin 0.8 0.2 - 1.2 mg/dL   Bilirubin, Direct 0.2 0.0 - 0.3 mg/dL   Alkaline Phosphatase 103 39 - 117 U/L   AST 14 0 - 37 U/L   ALT 12 0 - 53 U/L   Total Protein 6.9 6.0 - 8.3 g/dL   Albumin 4.2 3.5 - 5.2 g/dL  Lipid panel  Result Value Ref Range   Cholesterol 175 0 - 200 mg/dL   Triglycerides 59.0 0.0 - 149.0 mg/dL    HDL 40.50 >39.00 mg/dL   VLDL 11.8 0.0 - 40.0 mg/dL   LDL Cholesterol 123 (H) 0 - 99 mg/dL   Total CHOL/HDL Ratio 4    NonHDL 134.52     Assessment and Plan:     ICD-10-CM   1. Healthcare maintenance  Z00.00      I have no major concerns.  Primary recommendation was to continue to try to work on weight and be as physically active as possible.  Health Maintenance Exam: The patient's preventative maintenance and recommended screening tests for an annual wellness exam were reviewed in full today. Brought up to date unless services declined.  Counselled on the importance of diet, exercise, and its role in overall health and mortality. The patient's FH and SH was reviewed, including their home life, tobacco status, and drug and alcohol status.  Follow-up in 1 year for physical exam or additional follow-up below.  I have personally reviewed the Medicare Annual Wellness questionnaire and have noted 1. The patient's medical and social history 2. Their use of alcohol, tobacco or illicit drugs 3. Their current medications and supplements 4. The patient's functional ability including ADL's, fall risks, home safety risks and hearing or visual             impairment. 5. Diet and physical activities 6. Evidence for depression or mood disorders 7. Reviewed Updated provider list, see scanned forms and [facility] Snapshot.  8. Reviewed whether or not the patient has HCPOA or living will, and discussed what this means with the patient.  Recommended he bring in a copy for his chart in [facility].  The patients weight, height, BMI and visual acuity have been recorded in the chart I have made referrals, counseling and provided education to the patient based review of the above and I have provided the pt with a written personalized care plan for preventive services.  I have provided the patient with a copy of your personalized plan for preventive services. Instructed  to take the time to review along with their  updated medication list.  Follow-up: No follow-ups on file. Or follow-up in 1 year if not noted.   Signed,  Maud Deed. Cameren Odwyer, MD   Allergies as of 03/09/2021   No Known Allergies      Medication List        Accurate as of March 09, 2021 11:59 PM. If you have any questions, ask your nurse or doctor.          STOP taking these medications    acyclovir 400 MG tablet Commonly known as: ZOVIRAX Stopped by: Owens Loffler, MD       TAKE these medications    aspirin 325 MG EC tablet Take 325 mg by mouth. Every other day   famotidine 10 MG tablet Commonly known as: PEPCID Take 10 mg by mouth daily as needed for indigestion.   fluticasone 50 MCG/ACT nasal spray Commonly known as: FLONASE Place 2 sprays into both nostrils daily as needed for allergies or rhinitis.   ibuprofen 200 MG tablet Commonly known as: ADVIL Take 200 mg by mouth daily as needed.   naproxen sodium 220 MG tablet Commonly known as: ALEVE Take 220 mg by mouth daily as needed.

## 2021-05-09 DIAGNOSIS — Z23 Encounter for immunization: Secondary | ICD-10-CM | POA: Diagnosis not present

## 2021-08-10 DIAGNOSIS — M1612 Unilateral primary osteoarthritis, left hip: Secondary | ICD-10-CM | POA: Diagnosis not present

## 2021-08-10 DIAGNOSIS — M545 Low back pain, unspecified: Secondary | ICD-10-CM | POA: Diagnosis not present

## 2021-09-22 DIAGNOSIS — M25552 Pain in left hip: Secondary | ICD-10-CM | POA: Diagnosis not present

## 2021-09-22 DIAGNOSIS — G8929 Other chronic pain: Secondary | ICD-10-CM | POA: Diagnosis not present

## 2021-12-15 DIAGNOSIS — M1612 Unilateral primary osteoarthritis, left hip: Secondary | ICD-10-CM | POA: Diagnosis not present

## 2021-12-15 DIAGNOSIS — Z6831 Body mass index (BMI) 31.0-31.9, adult: Secondary | ICD-10-CM | POA: Diagnosis not present

## 2021-12-30 DIAGNOSIS — G8929 Other chronic pain: Secondary | ICD-10-CM | POA: Diagnosis not present

## 2021-12-30 DIAGNOSIS — M25552 Pain in left hip: Secondary | ICD-10-CM | POA: Diagnosis not present

## 2022-01-16 ENCOUNTER — Encounter: Payer: Self-pay | Admitting: Family Medicine

## 2022-01-16 ENCOUNTER — Ambulatory Visit (INDEPENDENT_AMBULATORY_CARE_PROVIDER_SITE_OTHER): Payer: Medicare Other | Admitting: Family Medicine

## 2022-01-16 VITALS — BP 130/82 | HR 83 | Temp 98.3°F | Ht 69.5 in | Wt 213.1 lb

## 2022-01-16 DIAGNOSIS — M1612 Unilateral primary osteoarthritis, left hip: Secondary | ICD-10-CM | POA: Diagnosis not present

## 2022-01-16 DIAGNOSIS — H9193 Unspecified hearing loss, bilateral: Secondary | ICD-10-CM

## 2022-01-16 NOTE — Progress Notes (Signed)
Timothy Lanza T. Timothy Senft, MD, [place] at [facility] [place], [address]  Phone: [phone]  FAX: [phone]  Timothy Wang - 71 y.o. male  MRN [id number]  Date of Birth: [birth year]  Date: 01/16/2022  PCP: Owens Loffler, MD  Referral: Owens Loffler, MD  Chief Complaint  Patient presents with   Follow-up    Left Hip Pain-Seen at [place]:   Timothy Wang is a 71 y.o. very pleasant male patient with Body mass index is 31.02 kg/m. who presents with the following:  He is here for discussion of left-sided hip osteoarthritis.  It looks like recently has had an intra-articular injection done under fluoroscopy on December 30, 2021.  He is also had a prior hip injection in the past as well.  On additional chart review it appears that on Dec 15, 2021, the patient had a discussion regarding left-sided total hip arthroplasty with Mr. Timothy Wang at [facility].  Different conversation about hip replacement techniques.    Having some hearing issues. Some changes with pressure while going to elevation.    Went to [employer], felt a reverberation.    Review of Systems is noted in the HPI, as appropriate  Objective:   BP 130/82   Pulse 83   Temp 98.3 F (36.8 C) (Oral)   Ht 5' 9.5" (1.765 m)   Wt 213 lb 2 oz (96.7 kg)   SpO2 96%   BMI 31.02 kg/m   GEN: No acute distress; alert,appropriate. PULM: Breathing comfortably in no respiratory distress PSYCH: Normally interactive.   Laboratory and Imaging Data:  Assessment and Plan:     ICD-10-CM   1. Localized osteoarthrosis of left hip  M16.12     2. Problems with hearing, bilateral  H91.93      We talked about a few things, and also talked about hip arthroplasty.  Does have some moderate degenerative joint disease of the left hip.  He has had some good relief with the intra-articular injection.  I think that the standard of care is still  traditional hip replacement, anterior approach versus posterior approach.  He has some interest in a minimally invasive procedure, this is not done in the entire state in [place].  My bias is that the teaching centers in [place] are some of the most exemplary hip centers in the world.  Reviewed.  Pressure changes, most certainly from eustachian tube dysfunction.  Medication Management during today's office visit: No orders of the defined types were placed in this encounter.  Medications Discontinued During This Encounter  Medication Reason   naproxen sodium (ANAPROX) 220 MG tablet    ibuprofen (ADVIL,MOTRIN) 200 MG tablet     Orders placed today for conditions managed today: No orders of the defined types were placed in this encounter.   Follow-up if needed: Return in about 3 months (around 04/18/2022) for Health Maintenance / Georgiana was used for transcription in this dictation.  Possible transcriptional errors can occur using Editor, commissioning.   Signed,  Maud Deed. Giordano Getman, MD   Outpatient Encounter Medications as of 01/16/2022  Medication Sig   acetaminophen (TYLENOL) 325 MG tablet    aspirin 325 MG EC tablet Take 325 mg by mouth. Every other day   famotidine (PEPCID) 10 MG tablet Take 10 mg by mouth daily as needed for indigestion.    fluticasone (FLONASE) 50 MCG/ACT nasal spray Place  2 sprays into both nostrils daily as needed for allergies or rhinitis.   meloxicam (MOBIC) 7.5 MG tablet Take 7.5 mg by mouth daily.   [DISCONTINUED] ibuprofen (ADVIL,MOTRIN) 200 MG tablet Take 200 mg by mouth daily as needed.   [DISCONTINUED] naproxen sodium (ANAPROX) 220 MG tablet Take 220 mg by mouth daily as needed.   No facility-administered encounter medications on file as of 01/16/2022.

## 2022-01-26 DIAGNOSIS — G8929 Other chronic pain: Secondary | ICD-10-CM | POA: Diagnosis not present

## 2022-01-26 DIAGNOSIS — M545 Low back pain, unspecified: Secondary | ICD-10-CM | POA: Diagnosis not present

## 2022-01-26 DIAGNOSIS — M25552 Pain in left hip: Secondary | ICD-10-CM | POA: Diagnosis not present

## 2022-03-06 DIAGNOSIS — H903 Sensorineural hearing loss, bilateral: Secondary | ICD-10-CM | POA: Diagnosis not present

## 2022-03-06 DIAGNOSIS — H6123 Impacted cerumen, bilateral: Secondary | ICD-10-CM | POA: Diagnosis not present

## 2022-03-20 ENCOUNTER — Other Ambulatory Visit: Payer: Self-pay | Admitting: Family Medicine

## 2022-03-20 DIAGNOSIS — Z79899 Other long term (current) drug therapy: Secondary | ICD-10-CM

## 2022-03-20 DIAGNOSIS — Z125 Encounter for screening for malignant neoplasm of prostate: Secondary | ICD-10-CM

## 2022-03-20 DIAGNOSIS — E7800 Pure hypercholesterolemia, unspecified: Secondary | ICD-10-CM

## 2022-03-29 ENCOUNTER — Other Ambulatory Visit (INDEPENDENT_AMBULATORY_CARE_PROVIDER_SITE_OTHER): Payer: Medicare Other

## 2022-03-29 DIAGNOSIS — E7800 Pure hypercholesterolemia, unspecified: Secondary | ICD-10-CM | POA: Diagnosis not present

## 2022-03-29 DIAGNOSIS — Z79899 Other long term (current) drug therapy: Secondary | ICD-10-CM | POA: Diagnosis not present

## 2022-03-29 DIAGNOSIS — Z125 Encounter for screening for malignant neoplasm of prostate: Secondary | ICD-10-CM

## 2022-03-29 LAB — LIPID PANEL
Cholesterol: 184 mg/dL (ref 0–200)
HDL: 47.3 mg/dL (ref >39.00)
LDL Cholesterol: 127 mg/dL — ABNORMAL HIGH (ref 0–99)
NonHDL: 136.21
Total CHOL/HDL Ratio: 4
Triglycerides: 48 mg/dL (ref 0.0–149.0)
VLDL: 9.6 mg/dL (ref 0.0–40.0)

## 2022-03-29 LAB — CBC WITH DIFFERENTIAL/PLATELET
Basophils Absolute: 0 10*3/uL (ref 0.0–0.1)
Basophils Relative: 0.8 % (ref 0.0–3.0)
Eosinophils Absolute: 0.1 10*3/uL (ref 0.0–0.7)
Eosinophils Relative: 1.2 % (ref 0.0–5.0)
HCT: 46.1 % (ref 39.0–52.0)
Hemoglobin: 15.3 g/dL (ref 13.0–17.0)
Lymphocytes Relative: 20 % (ref 12.0–46.0)
Lymphs Abs: 0.9 10*3/uL (ref 0.7–4.0)
MCHC: 33.3 g/dL (ref 30.0–36.0)
MCV: 92 fl (ref 78.0–100.0)
Monocytes Absolute: 0.5 10*3/uL (ref 0.1–1.0)
Monocytes Relative: 10.7 % (ref 3.0–12.0)
Neutro Abs: 2.9 10*3/uL (ref 1.4–7.7)
Neutrophils Relative %: 67.3 % (ref 43.0–77.0)
Platelets: 234 10*3/uL (ref 150.0–400.0)
RBC: 5.01 Mil/uL (ref 4.22–5.81)
RDW: 14.5 % (ref 11.5–15.5)
WBC: 4.3 10*3/uL (ref 4.0–10.5)

## 2022-03-29 LAB — BASIC METABOLIC PANEL
BUN: 16 mg/dL (ref 6–23)
CO2: 30 mEq/L (ref 19–32)
Calcium: 9.4 mg/dL (ref 8.4–10.5)
Chloride: 103 mEq/L (ref 96–112)
Creatinine, Ser: 0.81 mg/dL (ref 0.40–1.50)
GFR: 89.36 mL/min (ref >60.00)
Glucose, Bld: 81 mg/dL (ref 70–99)
Potassium: 4.8 mEq/L (ref 3.5–5.1)
Sodium: 141 mEq/L (ref 135–145)

## 2022-03-29 LAB — HEPATIC FUNCTION PANEL
ALT: 11 U/L (ref 0–53)
AST: 15 U/L (ref 0–37)
Albumin: 4.2 g/dL (ref 3.5–5.2)
Alkaline Phosphatase: 115 U/L (ref 39–117)
Bilirubin, Direct: 0.2 mg/dL (ref 0.0–0.3)
Total Bilirubin: 0.8 mg/dL (ref 0.2–1.2)
Total Protein: 6.7 g/dL (ref 6.0–8.3)

## 2022-03-29 LAB — PSA, MEDICARE: PSA: 0.61 ng/ml (ref 0.10–4.00)

## 2022-03-31 ENCOUNTER — Ambulatory Visit (INDEPENDENT_AMBULATORY_CARE_PROVIDER_SITE_OTHER): Payer: Medicare Other

## 2022-03-31 VITALS — Ht 72.0 in | Wt 224.0 lb

## 2022-03-31 DIAGNOSIS — Z0000 Encounter for general adult medical examination without abnormal findings: Secondary | ICD-10-CM

## 2022-03-31 NOTE — Patient Instructions (Signed)
Timothy Wang , Thank you for taking time to come for your Medicare Wellness Visit. I appreciate your ongoing commitment to your health goals. Please review the following plan we discussed and let me know if I can assist you in the future.   Screening recommendations/referrals: Colonoscopy: completed 05/31/2012, due 05/31/2022 Recommended yearly ophthalmology/optometry visit for glaucoma screening and checkup Recommended yearly dental visit for hygiene and checkup  Vaccinations: Influenza vaccine: due Pneumococcal vaccine: completed 10/09/2018 Tdap vaccine: completed 02/17/2013, due 02/18/2023 Shingles vaccine: completed   Covid-19:  07/06/2020, 10/14/2019, 09/20/2019  Advanced directives: Please bring a copy of your POA (Power of Attorney) and/or Living Will to your next appointment.   Conditions/risks identified: none  Next appointment: Follow up in one year for your annual wellness visit.   Preventive Care 35 Years and Older, Male Preventive care refers to lifestyle choices and visits with your health care provider that can promote health and wellness. What does preventive care include? A yearly physical exam. This is also called an annual well check. Dental exams once or twice a year. Routine eye exams. Ask your health care provider how often you should have your eyes checked. Personal lifestyle choices, including: Daily care of your teeth and gums. Regular physical activity. Eating a healthy diet. Avoiding tobacco and drug use. Limiting alcohol use. Practicing safe sex. Taking low doses of aspirin every day. Taking vitamin and mineral supplements as recommended by your health care provider. What happens during an annual well check? The services and screenings done by your health care provider during your annual well check will depend on your age, overall health, lifestyle risk factors, and family history of disease. Counseling  Your health care provider may ask you questions about  your: Alcohol use. Tobacco use. Drug use. Emotional well-being. Home and relationship well-being. Sexual activity. Eating habits. History of falls. Memory and ability to understand (cognition). Work and work Statistician. Screening  You may have the following tests or measurements: Height, weight, and BMI. Blood pressure. Lipid and cholesterol levels. These may be checked every 5 years, or more frequently if you are over 53 years old. Skin check. Lung cancer screening. You may have this screening every year starting at age 40 if you have a 30-pack-year history of smoking and currently smoke or have quit within the past 15 years. Fecal occult blood test (FOBT) of the stool. You may have this test every year starting at age 47. Flexible sigmoidoscopy or colonoscopy. You may have a sigmoidoscopy every 5 years or a colonoscopy every 10 years starting at age 23. Prostate cancer screening. Recommendations will vary depending on your family history and other risks. Hepatitis C blood test. Hepatitis B blood test. Sexually transmitted disease (STD) testing. Diabetes screening. This is done by checking your blood sugar (glucose) after you have not eaten for a while (fasting). You may have this done every 1-3 years. Abdominal aortic aneurysm (AAA) screening. You may need this if you are a current or former smoker. Osteoporosis. You may be screened starting at age 37 if you are at high risk. Talk with your health care provider about your test results, treatment options, and if necessary, the need for more tests. Vaccines  Your health care provider may recommend certain vaccines, such as: Influenza vaccine. This is recommended every year. Tetanus, diphtheria, and acellular pertussis (Tdap, Td) vaccine. You may need a Td booster every 10 years. Zoster vaccine. You may need this after age 77. Pneumococcal 13-valent conjugate (PCV13) vaccine. One dose is  recommended after age 66. Pneumococcal  polysaccharide (PPSV23) vaccine. One dose is recommended after age 43. Talk to your health care provider about which screenings and vaccines you need and how often you need them. This information is not intended to replace advice given to you by your health care provider. Make sure you discuss any questions you have with your health care provider. Document Released: 08/13/2015 Document Revised: 04/05/2016 Document Reviewed: 05/18/2015 Elsevier Interactive Patient Education  2017 [place] Prevention in the Home Falls can cause injuries. They can happen to people of all ages. There are many things you can do to make your home safe and to help prevent falls. What can I do on the outside of my home? Regularly fix the edges of walkways and driveways and fix any cracks. Remove anything that might make you trip as you walk through a door, such as a raised step or threshold. Trim any bushes or trees on the path to your home. Use bright outdoor lighting. Clear any walking paths of anything that might make someone trip, such as rocks or tools. Regularly check to see if handrails are loose or broken. Make sure that both sides of any steps have handrails. Any raised decks and porches should have guardrails on the edges. Have any leaves, snow, or ice cleared regularly. Use sand or salt on walking paths during winter. Clean up any spills in your garage right away. This includes oil or grease spills. What can I do in the bathroom? Use night lights. Install grab bars by the toilet and in the tub and shower. Do not use towel bars as grab bars. Use non-skid mats or decals in the tub or shower. If you need to sit down in the shower, use a plastic, non-slip stool. Keep the floor dry. Clean up any water that spills on the floor as soon as it happens. Remove soap buildup in the tub or shower regularly. Attach bath mats securely with double-sided non-slip rug tape. Do not have throw rugs and other  things on the floor that can make you trip. What can I do in the bedroom? Use night lights. Make sure that you have a light by your bed that is easy to reach. Do not use any sheets or blankets that are too big for your bed. They should not hang down onto the floor. Have a firm chair that has side arms. You can use this for support while you get dressed. Do not have throw rugs and other things on the floor that can make you trip. What can I do in the kitchen? Clean up any spills right away. Avoid walking on wet floors. Keep items that you use a lot in easy-to-reach places. If you need to reach something above you, use a strong step stool that has a grab bar. Keep electrical cords out of the way. Do not use floor polish or wax that makes floors slippery. If you must use wax, use non-skid floor wax. Do not have throw rugs and other things on the floor that can make you trip. What can I do with my stairs? Do not leave any items on the stairs. Make sure that there are handrails on both sides of the stairs and use them. Fix handrails that are broken or loose. Make sure that handrails are as long as the stairways. Check any carpeting to make sure that it is firmly attached to the stairs. Fix any carpet that is loose or worn. Avoid having  throw rugs at the top or bottom of the stairs. If you do have throw rugs, attach them to the floor with carpet tape. Make sure that you have a light switch at the top of the stairs and the bottom of the stairs. If you do not have them, ask someone to add them for you. What else can I do to help prevent falls? Wear shoes that: Do not have high heels. Have rubber bottoms. Are comfortable and fit you well. Are closed at the toe. Do not wear sandals. If you use a stepladder: Make sure that it is fully opened. Do not climb a closed stepladder. Make sure that both sides of the stepladder are locked into place. Ask someone to hold it for you, if possible. Clearly  mark and make sure that you can see: Any grab bars or handrails. First and last steps. Where the edge of each step is. Use tools that help you move around (mobility aids) if they are needed. These include: Canes. Walkers. Scooters. Crutches. Turn on the lights when you go into a dark area. Replace any light bulbs as soon as they burn out. Set up your furniture so you have a clear path. Avoid moving your furniture around. If any of your floors are uneven, fix them. If there are any pets around you, be aware of where they are. Review your medicines with your doctor. Some medicines can make you feel dizzy. This can increase your chance of falling. Ask your doctor what other things that you can do to help prevent falls. This information is not intended to replace advice given to you by your health care provider. Make sure you discuss any questions you have with your health care provider. Document Released: 05/13/2009 Document Revised: 12/23/2015 Document Reviewed: 08/21/2014 Elsevier Interactive Patient Education  2017 [employer].

## 2022-03-31 NOTE — Progress Notes (Signed)
I connected with Timothy Wang today by telephone and verified that I am speaking with the correct person using two identifiers. Location patient: home Location provider: work Persons participating in the virtual visit: Adin Laker, Glenna Durand LPN.   I discussed the limitations, risks, security and privacy concerns of performing an evaluation and management service by telephone and the availability of in person appointments. I also discussed with the patient that there may be a patient responsible charge related to this service. The patient expressed understanding and verbally consented to this telephonic visit.    Interactive audio and video telecommunications were attempted between this provider and patient, however failed, due to patient having technical difficulties OR patient did not have access to video capability.  We continued and completed visit with audio only.     Vital signs may be patient reported or missing.  Subjective:   Timothy Wang is a 71 y.o. male who presents for Medicare Annual/Subsequent preventive examination.  Review of Systems     Cardiac Risk Factors include: advanced age (>48mn, >>26women);dyslipidemia;hypertension;male gender;obesity (BMI >30kg/m2)     Objective:    Today's Vitals   03/31/22 1041 03/31/22 1042  Weight: 224 lb (101.6 kg)   Height: 6' (1.829 m)   PainSc:  3    Body mass index is 30.38 kg/m.     03/31/2022   10:53 AM 05/11/2020   11:39 AM 04/20/2020    7:38 AM 10/09/2018    9:46 AM 11/03/2016    6:19 AM 10/26/2016   12:20 PM  Advanced Directives  Does Patient Have a Medical Advance Directive? Yes No No No No No  Type of Advance Directive [place] Chart? No - copy requested       Would patient like information on creating a medical advance directive?  No - Patient declined No - Patient declined Yes (MAU/Ambulatory/Procedural Areas - Information given) Yes  (MAU/Ambulatory/Procedural Areas - Information given)     Current Medications (verified) Outpatient Encounter Medications as of 03/31/2022  Medication Sig   acetaminophen (TYLENOL) 325 MG tablet    azelastine (ASTELIN) 0.1 % nasal spray Place 1 spray into both nostrils daily. As needed   famotidine (PEPCID) 10 MG tablet Take 10 mg by mouth daily as needed for indigestion.    meloxicam (MOBIC) 7.5 MG tablet Take 7.5 mg by mouth daily.   aspirin 325 MG EC tablet Take 325 mg by mouth. Every other day (Patient not taking: Reported on 03/31/2022)   fluticasone (FLONASE) 50 MCG/ACT nasal spray Place 2 sprays into both nostrils daily as needed for allergies or rhinitis. (Patient not taking: Reported on 03/31/2022)   No facility-administered encounter medications on file as of 03/31/2022.    Allergies (verified) Patient has no known allergies.   History: Past Medical History:  Diagnosis Date   Allergic rhinitis due to pollen    Cataract 2020   right eye   Depression    GERD (gastroesophageal reflux disease)    H/O: Bell's palsy    Hyperlipidemia    Hypertension    Ventral hernia    Past Surgical History:  Procedure Laterality Date   CATARACT EXTRACTION W/PHACO Right 04/20/2020   Procedure: CATARACT EXTRACTION PHACO AND INTRAOCULAR LENS PLACEMENT ([place] RIGHT 11.14 00:53.9;  Surgeon: PBirder Robson MD;  [place] [facility]  Service: Ophthalmology;  Laterality: Right;   CATARACT EXTRACTION W/PHACO Left 05/11/2020   Procedure: CATARACT EXTRACTION  PHACO AND INTRAOCULAR LENS PLACEMENT (I[facility]) LEFT 4.98 00:28.8 ;  Surgeon: Birder Robson, MD;  Location: [place];  Service: Ophthalmology;  Laterality: Left;   COLONOSCOPY     HEEL SPUR SURGERY  1987   KNEE SURGERY  2015   TONSILLECTOMY  1960   VENTRAL HERNIA REPAIR N/A 11/03/2016   Procedure: HERNIA REPAIR VENTRAL ADULT WITH MESH;  Surgeon: Clayburn Pert, MD;  Location: [facility];  Service: General;  Laterality: N/A;    Family History  Problem Relation Age of Onset   Pancreatic cancer Mother    Alcohol abuse Father    Heart attack Father    Thyroid disease Sister    Depression Sister    Cancer Sister        breast   Social History   Socioeconomic History   Marital status: Single    Spouse name: Not on file   Number of children: Not on file   Years of education: Not on file   Highest education level: Not on file  Occupational History   Occupation: Scientist, clinical (histocompatibility and immunogenetics): [employer]  Tobacco Use   Smoking status: Former    Packs/day: 1.00    Years: 10.00    Total pack years: 10.00    Types: Cigarettes    Quit date: 10/27/2002    Years since quitting: 19.4   Smokeless tobacco: Never   Tobacco comments:    quit 2004  Vaping Use   Vaping Use: Never used  Substance and Sexual Activity   Alcohol use: Yes    Comment: Occassionally beer and wine   Drug use: No   Sexual activity: Not on file  Other Topics Concern   Not on file  Social History Narrative   Not on file   Social Determinants of Health   Financial Resource Strain: Low Risk  (03/31/2022)   Overall Financial Resource Strain (CARDIA)    Difficulty of Paying Living Expenses: Not hard at all  Food Insecurity: No Food Insecurity (03/31/2022)   Hunger Vital Sign    Worried About Running Out of Food in the Last Year: Never true    Ran Out of Food in the Last Year: Never true  Transportation Needs: No Transportation Needs (03/31/2022)   PRAPARE - Hydrologist (Medical): No    Lack of Transportation (Non-Medical): No  Physical Activity: Inactive (03/31/2022)   Exercise Vital Sign    Days of Exercise per Week: 0 days    Minutes of Exercise per Session: 0 min  Stress: No Stress Concern Present (03/31/2022)   [place]    Feeling of Stress : Not at all  Social Connections: Not on file    Tobacco Counseling Counseling given: Not  Answered Tobacco comments: quit 2004   Clinical Intake:  Pre-visit preparation completed: Yes  Pain : 0-10 Pain Score: 3  Pain Type: Chronic pain Pain Location: Hip Pain Orientation: Left Pain Descriptors / Indicators: Aching Pain Onset: More than a month ago Pain Frequency: Constant Pain Relieving Factors: APAP and meloxicam helps  Pain Relieving Factors: APAP and meloxicam helps  Nutritional Status: BMI > 30  Obese Nutritional Risks: None Diabetes: No  How often do you need to have someone help you when you read instructions, pamphlets, or other written materials from your doctor or pharmacy?: 1 - Never What is the last grade level you completed in school?: 1yr college  Diabetic? no  Interpreter Needed?:  No  Information entered by :: NAllen LPN   Activities of Daily Living    03/31/2022   10:56 AM 03/27/2022    8:43 AM  In your present state of health, do you have any difficulty performing the following activities:  Hearing? 1 0  Comment decreased hearing seeing ENT   Vision? 0 0  Difficulty concentrating or making decisions? 0 0  Walking or climbing stairs? 1 0  Dressing or bathing? 0 0  Doing errands, shopping? 0 0  Preparing Food and eating ? N N  Using the Toilet? N N  In the past six months, have you accidently leaked urine? N N  Do you have problems with loss of bowel control? N N  Managing your Medications? N N  Managing your Finances? N N  Housekeeping or managing your Housekeeping? N N    Patient Care Team: Owens Loffler, MD as PCP - General  Indicate any recent Medical Services you may have received from other than [facility] providers in the past year (date may be approximate).     Assessment:   This is a routine wellness examination for Timothy Wang.  Hearing/Vision screen Vision Screening - Comments:: Regular eye exams, [facility]  Dietary issues and exercise activities discussed: Current Exercise Habits: The patient does not  participate in regular exercise at present   Goals Addressed             This Visit's Progress    Patient Stated       03/31/2022, no goals       Depression Screen    03/31/2022   10:56 AM 03/09/2021    2:50 PM 10/13/2019   11:20 AM 10/09/2018    9:44 AM 09/05/2017    9:03 AM  PHQ 2/9 Scores  PHQ - 2 Score 0 0 0 0 0  PHQ- 9 Score   0 0     Fall Risk    03/31/2022   10:55 AM 03/27/2022    8:43 AM 03/09/2021    2:50 PM 10/13/2019   10:32 AM 10/09/2018    9:44 AM  Fall Risk   Falls in the past year? 0 0 0 0 0  Number falls in past yr: 0 0     Injury with Fall? 0 0     Risk for fall due to : Medication side effect      Follow up Falls prevention discussed;Education provided;Falls evaluation completed        FALL RISK PREVENTION PERTAINING TO THE HOME:  Any stairs in or around the home? No  If so, are there any without handrails? N/a Home free of loose throw rugs in walkways, pet beds, electrical cords, etc? Yes  Adequate lighting in your home to reduce risk of falls? Yes   ASSISTIVE DEVICES UTILIZED TO PREVENT FALLS:  Life alert? No  Use of a cane, walker or w/c? No  Grab bars in the bathroom? Yes  Shower chair or bench in shower? Yes  Elevated toilet seat or a handicapped toilet? No   TIMED UP AND GO:  Was the test performed? No .       Cognitive Function:    10/09/2018    9:45 AM  MMSE - Mini Mental State Exam  Orientation to time 5  Orientation to Place 5  Registration 3  Attention/ Calculation 0  Recall 3  Language- name 2 objects 0  Language- repeat 1  Language- follow 3 step command 3  Language- read &  follow direction 0  Write a sentence 0  Copy design 0  Total score 20        03/31/2022   11:00 AM  6CIT Screen  What Year? 0 points  What month? 0 points  What time? 0 points  Count back from 20 0 points  Months in reverse 0 points  Repeat phrase 0 points  Total Score 0 points    Immunizations Immunization History  Administered Date(s)  Administered   Influenza Whole 04/10/2011   Influenza,inj,Quad PF,6+ Mos 04/30/2014, 06/05/2016, 05/24/2017, 10/09/2018   Influenza-Unspecified 04/18/2019, 04/21/2020, 05/09/2021   PFIZER(Purple Top)SARS-COV-2 Vaccination 09/20/2019, 10/14/2019, 07/06/2020   Pneumococcal Conjugate-13 05/24/2017   Pneumococcal Polysaccharide-23 10/09/2018   Tdap 02/17/2013   Zoster Recombinat (Shingrix) 04/18/2018, 08/08/2018   Zoster, Live 09/10/2014    TDAP status: Up to date  Flu Vaccine status: Due, Education has been provided regarding the importance of this vaccine. Advised may receive this vaccine at local pharmacy or Health Dept. Aware to provide a copy of the vaccination record if obtained from local pharmacy or Health Dept. Verbalized acceptance and understanding.  Pneumococcal vaccine status: Up to date  Covid-19 vaccine status: Completed vaccines  Qualifies for Shingles Vaccine? Yes   Zostavax completed Yes   Shingrix Completed?: Yes  Screening Tests Health Maintenance  Topic Date Due   COVID-19 Vaccine (4 - Pfizer risk series) 08/31/2020   INFLUENZA VACCINE  02/28/2022   COLONOSCOPY (Pts 45-74yr Insurance coverage will need to be confirmed)  05/31/2022   TETANUS/TDAP  02/18/2023   Pneumonia Vaccine 70 Years old  Completed   Hepatitis C Screening  Completed   Zoster Vaccines- Shingrix  Completed   HPV VACCINES  Aged Out    Health Maintenance  Health Maintenance Due  Topic Date Due   COVID-19 Vaccine (4 - Pfizer risk series) 08/31/2020   INFLUENZA VACCINE  02/28/2022    Colorectal cancer screening: Type of screening: Colonoscopy. Completed 05/31/2012. Repeat every 10 years  Lung Cancer Screening: (Low Dose CT Chest recommended if Age 70-80years, 30 pack-year currently smoking OR have quit w/in 15years.) does not qualify.   Lung Cancer Screening Referral: no  Additional Screening:  Hepatitis C Screening: does qualify; Completed 05/29/2016  Vision Screening:  Recommended annual ophthalmology exams for early detection of glaucoma and other disorders of the eye. Is the patient up to date with their annual eye exam?  Yes  Who is the provider or what is the name of the office in which the patient attends annual [facility] exams[facility] provider, would they like to be referred to a provider to establish care? No .   Dental Screening: Recommended annual dental exams for proper oral hygiene  Community Resource Referral / Chronic Care Management: CRR required this visit?  No   CCM required this visit?  No      Plan:     I have personally reviewed and noted the following in the patient's chart:   Medical and social history Use of alcohol, tobacco or illicit drugs  Current medications and supplements including opioid prescriptions. Patient is not currently taking opioid prescriptions. Functional ability and status Nutritional status Physical activity Advanced directives List of other physicians Hospitalizations, surgeries, and ER visits in previous 12 months Vitals Screenings to include cognitive, depression, and falls Referrals and appointments  In addition, I have reviewed and discussed with patient certain preventive protocols, quality metrics, and best practice recommendations. A written personalized care plan  for preventive services as well as general preventive health recommendations were provided to patient.     Kellie Simmering, LPN   04/03/8545   Nurse Notes: none  Due to this being a virtual visit, the after visit summary with patients personalized plan was offered to patient via mail or my-chart. Patient would like to access on my-chart

## 2022-04-04 NOTE — Progress Notes (Unsigned)
    Holly Iannaccone T. Ashlyn Cabler, MD, [place] at [facility] [place], [address]  Phone: [phone]  FAX: [phone]  Timothy Wang - 71 y.o. male  MRN [id number]  Date of Birth: 1951/04/02  Date: 04/05/2022  PCP: Owens Loffler, MD  Referral: Owens Loffler, MD  No chief complaint on file.  Subjective:   Timothy Wang is a 71 y.o. very pleasant male patient with There is no height or weight on file to calculate BMI. who presents with the following:  He is here to f/u regarding chronic medical problems after his AMW with the health nurse.     Lipids: Doing well, stable. Tolerating meds fine with no SE. Panel reviewed with patient.  Lipids: Lab Results  Component Value Date   CHOL 184 03/29/2022   Lab Results  Component Value Date   HDL 47.30 03/29/2022   Lab Results  Component Value Date   LDLCALC 127 (H) 03/29/2022   Lab Results  Component Value Date   TRIG 48.0 03/29/2022   Lab Results  Component Value Date   CHOLHDL 4 03/29/2022    Lab Results  Component Value Date   ALT 11 03/29/2022   AST 15 03/29/2022   ALKPHOS 115 03/29/2022   BILITOT 0.8 03/29/2022    HTN: Tolerating all medications without side effects Stable and at goal No CP, no sob. No HA.  BP Readings from Last 3 Encounters:  01/16/22 130/82  03/09/21 120/80  02/24/21 426/83    Basic Metabolic Panel:    Component Value Date/Time   NA 141 03/29/2022 0854   K 4.8 03/29/2022 0854   CL 103 03/29/2022 0854   CO2 30 03/29/2022 0854   BUN 16 03/29/2022 0854   CREATININE 0.81 03/29/2022 0854   GLUCOSE 81 03/29/2022 0854   CALCIUM 9.4 03/29/2022 0854     Review of Systems is noted in the HPI, as appropriate  Objective:   There were no vitals taken for this visit.  GEN: No acute distress; alert,appropriate. PULM: Breathing comfortably in no respiratory distress PSYCH: Normally interactive.   Laboratory and Imaging  Data:  Assessment and Plan:   ***

## 2022-04-05 ENCOUNTER — Other Ambulatory Visit: Payer: Self-pay

## 2022-04-05 ENCOUNTER — Telehealth: Payer: Self-pay

## 2022-04-05 ENCOUNTER — Ambulatory Visit (INDEPENDENT_AMBULATORY_CARE_PROVIDER_SITE_OTHER): Payer: Medicare Other | Admitting: Family Medicine

## 2022-04-05 ENCOUNTER — Encounter: Payer: Self-pay | Admitting: Family Medicine

## 2022-04-05 VITALS — BP 124/80 | HR 78 | Temp 97.6°F | Ht 69.5 in | Wt 216.5 lb

## 2022-04-05 DIAGNOSIS — R011 Cardiac murmur, unspecified: Secondary | ICD-10-CM

## 2022-04-05 DIAGNOSIS — I10 Essential (primary) hypertension: Secondary | ICD-10-CM | POA: Diagnosis not present

## 2022-04-05 DIAGNOSIS — Z1211 Encounter for screening for malignant neoplasm of colon: Secondary | ICD-10-CM

## 2022-04-05 DIAGNOSIS — E782 Mixed hyperlipidemia: Secondary | ICD-10-CM

## 2022-04-05 DIAGNOSIS — R600 Localized edema: Secondary | ICD-10-CM | POA: Diagnosis not present

## 2022-04-05 MED ORDER — NA SULFATE-K SULFATE-MG SULF 17.5-3.13-1.6 GM/177ML PO SOLN: 1.0000 | Freq: Once | ORAL | 0 refills | Status: AC

## 2022-04-05 NOTE — Telephone Encounter (Signed)
Gastroenterology Pre-Procedure Review  Request Date: 05/04/22 Requesting Physician: Dr. Allen Norris  PATIENT REVIEW QUESTIONS: The patient responded to the following health history questions as indicated:    1. Are you having any GI issues? no 2. Do you have a personal history of Polyps? no 3. Do you have a family history of Colon Cancer or Polyps? no 4. Diabetes Mellitus? no 5. Joint replacements in the past 12 months?no 6. Major health problems in the past 3 months?no 7. Any artificial heart valves, MVP, or defibrillator?no    MEDICATIONS & ALLERGIES:    Patient reports the following regarding taking any anticoagulation/antiplatelet therapy:   Plavix, Coumadin, Eliquis, Xarelto, Lovenox, Pradaxa, Brilinta, or Effient? no Aspirin? no  Patient confirms/reports the following medications:  Current Outpatient Medications  Medication Sig Dispense Refill   acetaminophen (TYLENOL) 325 MG tablet Take 325 mg by mouth daily as needed.     azelastine (ASTELIN) 0.1 % nasal spray Place 1 spray into both nostrils daily. As needed     meloxicam (MOBIC) 7.5 MG tablet Take 7.5 mg by mouth daily.     No current facility-administered medications for this visit.    Patient confirms/reports the following allergies:  No Known Allergies  No orders of the defined types were placed in this encounter.   AUTHORIZATION INFORMATION Primary Insurance: 1D#: Group #:  Secondary Insurance: 1D#: Group #:  SCHEDULE INFORMATION: Date: 05/04/22 Time: Location: [place]

## 2022-04-26 ENCOUNTER — Other Ambulatory Visit: Payer: Self-pay

## 2022-04-26 ENCOUNTER — Encounter: Payer: Self-pay | Admitting: Gastroenterology

## 2022-04-26 DIAGNOSIS — M1612 Unilateral primary osteoarthritis, left hip: Secondary | ICD-10-CM | POA: Diagnosis not present

## 2022-05-04 ENCOUNTER — Ambulatory Visit: Payer: Medicare Other | Admitting: Anesthesiology

## 2022-05-04 ENCOUNTER — Encounter: Payer: Self-pay | Admitting: Gastroenterology

## 2022-05-04 ENCOUNTER — Encounter
Admission: RE | Disposition: A | Discharge status: Home / Self Care | Payer: Self-pay | Source: Home / Self Care | Attending: Gastroenterology

## 2022-05-04 ENCOUNTER — Other Ambulatory Visit: Payer: Self-pay

## 2022-05-04 ENCOUNTER — Ambulatory Visit
Admission: RE | Admit: 2022-05-04 | Discharge: 2022-05-04 | Disposition: A | Discharge status: Home / Self Care | Payer: Medicare Other | Attending: Gastroenterology | Admitting: Gastroenterology

## 2022-05-04 DIAGNOSIS — Z87891 Personal history of nicotine dependence: Secondary | ICD-10-CM | POA: Insufficient documentation

## 2022-05-04 DIAGNOSIS — K640 First degree hemorrhoids: Secondary | ICD-10-CM | POA: Insufficient documentation

## 2022-05-04 DIAGNOSIS — D122 Benign neoplasm of ascending colon: Secondary | ICD-10-CM | POA: Insufficient documentation

## 2022-05-04 DIAGNOSIS — Z1211 Encounter for screening for malignant neoplasm of colon: Secondary | ICD-10-CM | POA: Diagnosis not present

## 2022-05-04 DIAGNOSIS — K219 Gastro-esophageal reflux disease without esophagitis: Secondary | ICD-10-CM | POA: Diagnosis not present

## 2022-05-04 DIAGNOSIS — J301 Allergic rhinitis due to pollen: Secondary | ICD-10-CM | POA: Insufficient documentation

## 2022-05-04 DIAGNOSIS — I10 Essential (primary) hypertension: Secondary | ICD-10-CM | POA: Diagnosis not present

## 2022-05-04 DIAGNOSIS — D126 Benign neoplasm of colon, unspecified: Secondary | ICD-10-CM | POA: Diagnosis not present

## 2022-05-04 DIAGNOSIS — K635 Polyp of colon: Secondary | ICD-10-CM | POA: Diagnosis not present

## 2022-05-04 HISTORY — PX: POLYPECTOMY: SHX5525

## 2022-05-04 HISTORY — PX: COLONOSCOPY WITH PROPOFOL: SHX5780

## 2022-05-04 SURGERY — COLONOSCOPY WITH PROPOFOL
Anesthesia: Monitor Anesthesia Care | Site: Rectum

## 2022-05-04 MED ORDER — LACTATED RINGERS IV SOLN: INTRAVENOUS | Status: DC

## 2022-05-04 MED ORDER — STERILE WATER FOR IRRIGATION IR SOLN
Status: DC | PRN
  Administered 2022-05-04: 50 mL

## 2022-05-04 MED ORDER — PROPOFOL 10 MG/ML IV BOLUS
INTRAVENOUS | Status: DC | PRN
  Administered 2022-05-04: 50 mg via INTRAVENOUS
  Administered 2022-05-04: 100 mg via INTRAVENOUS
  Administered 2022-05-04: 50 mg via INTRAVENOUS

## 2022-05-04 MED ORDER — SODIUM CHLORIDE 0.9 % IV SOLN: INTRAVENOUS | Status: DC

## 2022-05-04 SURGICAL SUPPLY — 8 items
GOWN CVR UNV OPN BCK APRN NK (MISCELLANEOUS) ×2 IMPLANT
GOWN ISOL THUMB LOOP REG UNIV (MISCELLANEOUS) ×2
KIT PRC NS LF DISP ENDO (KITS) ×1 IMPLANT
KIT PROCEDURE OLYMPUS (KITS) ×1
MANIFOLD NEPTUNE II (INSTRUMENTS) ×1 IMPLANT
SNARE COLD EXACTO (MISCELLANEOUS) IMPLANT
TRAP ETRAP POLY (MISCELLANEOUS) IMPLANT
WATER STERILE IRR 250ML POUR (IV SOLUTION) ×1 IMPLANT

## 2022-05-04 NOTE — Anesthesia Preprocedure Evaluation (Signed)
Anesthesia Evaluation  Patient identified by MRN, date of birth, ID band Patient awake    Reviewed: Allergy & Precautions, NPO status , Patient's Chart, lab work & pertinent test results, reviewed documented beta blocker date and time   History of Anesthesia Complications Negative for: history of anesthetic complications  Airway Mallampati: III  TM Distance: >3 FB Neck ROM: Full    Dental no notable dental hx. (+) Dental Advidsory Given, Teeth Intact   Pulmonary neg pulmonary ROS, neg shortness of breath, neg COPD, former smoker,    Pulmonary exam normal breath sounds clear to auscultation       Cardiovascular Exercise Tolerance: Good hypertension, (-) angina(-) Past MI, (-) CABG and (-) DOE negative cardio ROS Normal cardiovascular exam+ Valvular Problems/Murmurs  Rhythm:Regular Rate:Normal   HLD   Neuro/Psych PSYCHIATRIC DISORDERS Depression  H/o Bell's palsy negative neurological ROS  negative psych ROS   GI/Hepatic negative GI ROS, Neg liver ROS, GERD  Controlled,  Endo/Other  negative endocrine ROS  Renal/GU negative Renal ROS  negative genitourinary   Musculoskeletal   Abdominal (+) + obese (BMI 31),   Peds  Hematology negative hematology ROS (+)   Anesthesia Other Findings Past Medical History: No date: Allergic rhinitis due to pollen 2020: Cataract     Comment:  right eye No date: Depression No date: GERD (gastroesophageal reflux disease) No date: H/O: Bell's palsy No date: Hyperlipidemia No date: Hypertension No date: Ventral hernia  Past Surgical History: 04/20/2020: CATARACT EXTRACTION W/PHACO; Right     Comment:  Procedure: CATARACT EXTRACTION PHACO AND INTRAOCULAR               LENS PLACEMENT ([place]) RIGHT 11.14 00:53.9;  Surgeon:               Birder Robson, MD;  Location: [place];                Service: Ophthalmology;  Laterality: Right; 05/11/2020: CATARACT EXTRACTION  W/PHACO; Left     Comment:  Procedure: CATARACT EXTRACTION PHACO AND INTRAOCULAR               LENS PLACEMENT (IOC) LEFT 4.98 00:28.8 ;  Surgeon:               Birder Robson, MD;  Location: [place];                Service: Ophthalmology;  Laterality: Left; No date: COLONOSCOPY 1987: HEEL SPUR SURGERY 2015: [place]: TONSILLECTOMY 11/03/2016: VENTRAL HERNIA REPAIR; N/A     Comment:  Procedure: HERNIA REPAIR VENTRAL ADULT WITH MESH;                Surgeon: Clayburn Pert, MD;  Location: [facility];                Service: General;  Laterality: N/A;  BMI    Body Mass Index: 28.87 kg/m      Reproductive/Obstetrics negative OB ROS                            Anesthesia Physical  Anesthesia Plan  ASA: 3  Anesthesia Plan: General ETT   Post-op Pain Management:    Induction: Intravenous  PONV Risk Score and Plan: 2 and TIVA and Propofol infusion  Airway Management Planned: Natural Airway and Nasal Cannula  Additional Equipment: None  Intra-op Plan:   Post-operative Plan: Extubation in OR  Informed Consent: I have reviewed the  patients History and Physical, chart, labs and discussed the procedure including the risks, benefits and alternatives for the proposed anesthesia with the patient or authorized representative who has indicated his/her understanding and acceptance.     Dental Advisory Given  Plan Discussed with: Anesthesiologist, CRNA and Surgeon  Anesthesia Plan Comments: (Patient consented for risks of anesthesia including but not limited to:  - adverse reactions to medications - damage to eyes, teeth, lips or other oral mucosa - nerve damage due to positioning  - sore throat or hoarseness - Damage to heart, brain, nerves, lungs, other parts of body or loss of life  Patient voiced understanding.)       Anesthesia Quick Evaluation

## 2022-05-04 NOTE — Anesthesia Postprocedure Evaluation (Signed)
Anesthesia Post Note  Patient: Timothy Wang  Procedure(s) Performed: COLONOSCOPY WITH BIOPSY ([place]) POLYPECTOMY (Rectum)  Patient location during evaluation: PACU Anesthesia Type: MAC Level of consciousness: awake and alert Pain management: pain level controlled Vital Signs Assessment: post-procedure vital signs reviewed and stable Respiratory status: spontaneous breathing, nonlabored ventilation, respiratory function stable and patient connected to nasal cannula oxygen Cardiovascular status: blood pressure returned to baseline and stable Postop Assessment: no apparent nausea or vomiting Anesthetic complications: no   No notable events documented.   Last Vitals:  Vitals:   05/04/22 0906 05/04/22 1008  BP: (!) 139/94 111/74  Pulse: (!) 110 90  Resp:  17  Temp: 36.6 C 36.7 C  SpO2: 98% 91%    Last Pain:  Vitals:   05/04/22 1008  TempSrc:   PainSc: [name]

## 2022-05-04 NOTE — Transfer of Care (Signed)
Immediate Anesthesia Transfer of Care Note  Patient: Timothy Wang  Procedure(s) Performed: COLONOSCOPY WITH BIOPSY (Rectum) POLYPECTOMY (Rectum)  Patient Location: PACU  Anesthesia Type: MAC  Level of Consciousness: awake, alert  and patient cooperative  Airway and Oxygen Therapy: Patient Spontanous Breathing and Patient connected to supplemental oxygen  Post-op Assessment: Post-op Vital signs reviewed, Patient's Cardiovascular Status Stable, Respiratory Function Stable, Patent Airway and No signs of Nausea or vomiting  Post-op Vital Signs: Reviewed and stable  Complications: No notable events documented.

## 2022-05-04 NOTE — H&P (Signed)
Lucilla Lame, MD [address] [place], [place] [address] Phone: [phone] Fax : [phone]  Primary Care Physician:  Owens Loffler, MD Primary Gastroenterologist:  Dr. Allen Norris  Pre-Procedure History & Physical: HPI:  Timothy Wang is a 71 y.o. male is here for a screening colonoscopy.   Past Medical History:  Diagnosis Date   Allergic rhinitis due to pollen    Cataract 2020   right eye   Depression    GERD (gastroesophageal reflux disease)    H/O: Bell's palsy    Hyperlipidemia    Hypertension    Ventral hernia     Past Surgical History:  Procedure Laterality Date   CATARACT EXTRACTION W/PHACO Right 04/20/2020   Procedure: CATARACT EXTRACTION PHACO AND INTRAOCULAR LENS PLACEMENT ([place]) RIGHT 11.14 00:53.9;  Surgeon: Birder Robson, MD;  Location: [facility];  Service: Ophthalmology;  Laterality: Right;   CATARACT EXTRACTION W/PHACO Left 05/11/2020   Procedure: CATARACT EXTRACTION PHACO AND INTRAOCULAR LENS PLACEMENT ([facility]) LEFT 4.98 00:28.8 ;  Surgeon: Birder Robson, MD;  Location: [facility];  Service: Ophthalmology;  Laterality: Left;   COLONOSCOPY     HEEL SPUR SURGERY  1987   KNEE SURGERY  2015   TONSILLECTOMY  1960   VENTRAL HERNIA REPAIR N/A 11/03/2016   Procedure: HERNIA REPAIR VENTRAL ADULT WITH MESH;  Surgeon: Clayburn Pert, MD;  Location: [facility];  Service: General;  Laterality: N/A;    Prior to Admission medications   Medication Sig Start Date End Date Taking? Authorizing Provider  acetaminophen (TYLENOL) 325 MG tablet Take 325 mg by mouth daily as needed. 12/15/21   [provider]  azelastine (ASTELIN) 0.1 % nasal spray Place 1 spray into both nostrils daily. As needed    [provider]  meloxicam (MOBIC) 7.5 MG tablet Take 7.5 mg by mouth daily. 01/14/22   [provider]    Allergies as of 04/05/2022   (No Known Allergies)    Family History  Problem Relation Age of Onset    Pancreatic cancer Mother    Alcohol abuse Father    Heart attack Father    Thyroid disease Sister    Depression Sister    Cancer Sister        breast    Social History   Socioeconomic History   Marital status: Single    Spouse name: Not on file   Number of children: Not on file   Years of education: Not on file   Highest education level: Not on file  Occupational History   Occupation: Scientist, clinical (histocompatibility and immunogenetics): [employer]  Tobacco Use   Smoking status: Former    Packs/day: 1.00    Years: 10.00    Total pack years: 10.00    Types: Cigarettes    Quit date: 10/27/2002    Years since quitting: 19.5   Smokeless tobacco: Never   Tobacco comments:    quit 2004  Vaping Use   Vaping Use: Never used  Substance and Sexual Activity   Alcohol use: Yes    Comment: Occassionally beer and wine   Drug use: No   Sexual activity: Not on file  Other Topics Concern   Not on file  Social History Narrative   Not on file   Social Determinants of Health   Financial Resource Strain: Low Risk  (03/31/2022)   Overall Financial Resource Strain (CARDIA)    Difficulty of Paying Living Expenses: Not hard at all  Food Insecurity: No Food Insecurity (  03/31/2022)   Hunger Vital Sign    Worried About Running Out of Food in the Last Year: Never true    [place] in the Last Year: Never true  Transportation Needs: No Transportation Needs (03/31/2022)   PRAPARE - Hydrologist (Medical): No    Lack of Transportation (Non-Medical): No  Physical Activity: Inactive (03/31/2022)   Exercise Vital Sign    Days of Exercise per Week: 0 days    Minutes of Exercise per Session: 0 min  Stress: No Stress Concern Present (03/31/2022)   [place]    Feeling of Stress : Not at all  Social Connections: Not on file  Intimate Partner Violence: Not on file    Review of Systems: See HPI, otherwise negative ROS  Physical  Exam: Ht '5\' 11"'$  (1.803 m)   Wt 99.8 kg   BMI 30.68 kg/m  General:   Alert,  pleasant and cooperative in NAD Head:  Normocephalic and atraumatic. Neck:  Supple; no masses or thyromegaly. Lungs:  Clear throughout to auscultation.    Heart:  Regular rate and rhythm. Abdomen:  Soft, nontender and nondistended. Normal bowel sounds, without guarding, and without rebound.   Neurologic:  Alert and  oriented x4;  grossly normal neurologically.  Impression/Plan: Timothy Wang is now here to undergo a screening colonoscopy.  Risks, benefits, and alternatives regarding colonoscopy have been reviewed with the patient.  Questions have been answered.  All parties agreeable.

## 2022-05-04 NOTE — Op Note (Signed)
[facility] Gastroenterology Patient Name: Timothy Wang Procedure Date: 05/04/2022 9:33 AM MRN: [id number] Account #: [id number] Date of Birth: 02-May-1951 Admit Type: Outpatient Age: 71 Room: [facility] OR ROOM 01 Gender: Male Note Status: Finalized Instrument Name: [id number] Procedure:             Colonoscopy Indications:           Screening for colorectal malignant neoplasm Providers:             Lucilla Lame MD, MD Referring MD:          Maud Deed. Copland MD, MD (Referring MD) Medicines:             Propofol per Anesthesia Complications:         No immediate complications. Procedure:             Pre-Anesthesia Assessment:                        - Prior to the procedure, a History and Physical was                         performed, and patient medications and allergies were                         reviewed. The patient's tolerance of previous                         anesthesia was also reviewed. The risks and benefits                         of the procedure and the sedation options and risks                         were discussed with the patient. All questions were                         answered, and informed consent was obtained. Prior                         Anticoagulants: The patient has taken no previous                         anticoagulant or antiplatelet agents. ASA Grade                         Assessment: II - A patient with mild systemic disease.                         After reviewing the risks and benefits, the patient                         was deemed in satisfactory condition to undergo the                         procedure.                        After obtaining informed consent, the colonoscope was  passed under direct vision. Throughout the procedure,                         the patient's blood pressure, pulse, and oxygen                         saturations were monitored continuously. The                          Colonoscope was introduced through the anus and                         advanced to the the cecum, identified by appendiceal                         orifice and ileocecal valve. The colonoscopy was                         performed without difficulty. The patient tolerated                         the procedure well. The quality of the bowel                         preparation was good. Findings:      The perianal and digital rectal examinations were normal.      A 10 mm polyp was found in the ascending colon. The polyp was       pedunculated. The polyp was removed with a cold snare. Resection and       retrieval were complete.      Non-bleeding internal hemorrhoids were found during retroflexion. The       hemorrhoids were Grade I (internal hemorrhoids that do not prolapse). Impression:            - One 10 mm polyp in the ascending colon, removed with                         a cold snare. Resected and retrieved.                        - Non-bleeding internal hemorrhoids. Recommendation:        - Discharge patient to home.                        - Resume previous diet.                        - Continue present medications.                        - Await pathology results.                        - If the pathology report reveals adenomatous tissue,                         then repeat the colonoscopy for surveillance in 5                         years.  Procedure Code(s):     --- Professional ---                        [phone], Colonoscopy, flexible; with removal of                         tumor(s), polyp(s), or other lesion(s) by snare                         technique Diagnosis Code(s):     --- Professional ---                        Z12.11, Encounter for screening for malignant neoplasm                         of colon                        K63.5, Polyp of colon CPT copyright 2019 [employer]. All rights reserved. The codes documented in this report are preliminary and  upon coder review may  be revised to meet current compliance requirements. Lucilla Lame MD, MD 05/04/2022 10:04:48 AM This report has been signed electronically. Number of Addenda: 0 Note Initiated On: 05/04/2022 9:33 AM Scope Withdrawal Time: 0 hours 6 minutes 14 seconds  Total Procedure Duration: 0 hours 16 minutes 4 seconds  Estimated Blood Loss:  Estimated blood loss: none.      [facility]

## 2022-05-05 ENCOUNTER — Encounter: Payer: Self-pay | Admitting: Gastroenterology

## 2022-05-08 ENCOUNTER — Encounter: Payer: Self-pay | Admitting: Gastroenterology

## 2022-05-08 LAB — SURGICAL PATHOLOGY

## 2022-05-16 ENCOUNTER — Ambulatory Visit: Payer: Medicare Other | Attending: Family Medicine

## 2022-05-16 DIAGNOSIS — R011 Cardiac murmur, unspecified: Secondary | ICD-10-CM | POA: Insufficient documentation

## 2022-05-16 DIAGNOSIS — R600 Localized edema: Secondary | ICD-10-CM | POA: Insufficient documentation

## 2022-05-16 LAB — ECHOCARDIOGRAM COMPLETE
AR max vel: 2.05 cm2
AV Area VTI: 2.86 cm2
AV Area mean vel: 2.38 cm2
AV Mean grad: 9.5 mmHg
AV Peak grad: 16.2 mmHg
Ao pk vel: 2.01 m/s
Area-P 1/2: 3.43 cm2
S' Lateral: 2.1 cm

## 2022-05-26 DIAGNOSIS — Z23 Encounter for immunization: Secondary | ICD-10-CM | POA: Diagnosis not present

## 2022-06-02 DIAGNOSIS — M1612 Unilateral primary osteoarthritis, left hip: Secondary | ICD-10-CM | POA: Diagnosis not present

## 2022-06-02 DIAGNOSIS — I10 Essential (primary) hypertension: Secondary | ICD-10-CM | POA: Diagnosis not present

## 2022-06-07 DIAGNOSIS — H353131 Nonexudative age-related macular degeneration, bilateral, early dry stage: Secondary | ICD-10-CM | POA: Diagnosis not present

## 2022-06-13 DIAGNOSIS — H353131 Nonexudative age-related macular degeneration, bilateral, early dry stage: Secondary | ICD-10-CM | POA: Diagnosis not present

## 2022-06-26 ENCOUNTER — Encounter: Payer: Self-pay | Admitting: Radiology

## 2022-06-26 ENCOUNTER — Observation Stay
Admission: EM | Admit: 2022-06-26 | Discharge: 2022-06-27 | Disposition: A | Discharge status: Home / Self Care | Payer: Medicare Other | Attending: Internal Medicine | Admitting: Internal Medicine

## 2022-06-26 ENCOUNTER — Observation Stay: Payer: Medicare Other

## 2022-06-26 ENCOUNTER — Emergency Department: Payer: Medicare Other

## 2022-06-26 DIAGNOSIS — Z87891 Personal history of nicotine dependence: Secondary | ICD-10-CM | POA: Diagnosis not present

## 2022-06-26 DIAGNOSIS — R2689 Other abnormalities of gait and mobility: Secondary | ICD-10-CM | POA: Insufficient documentation

## 2022-06-26 DIAGNOSIS — H53451 Other localized visual field defect, right eye: Secondary | ICD-10-CM | POA: Diagnosis present

## 2022-06-26 DIAGNOSIS — I6523 Occlusion and stenosis of bilateral carotid arteries: Secondary | ICD-10-CM | POA: Diagnosis not present

## 2022-06-26 DIAGNOSIS — H547 Unspecified visual loss: Secondary | ICD-10-CM

## 2022-06-26 DIAGNOSIS — H5461 Unqualified visual loss, right eye, normal vision left eye: Principal | ICD-10-CM | POA: Insufficient documentation

## 2022-06-26 DIAGNOSIS — H3411 Central retinal artery occlusion, right eye: Secondary | ICD-10-CM | POA: Diagnosis not present

## 2022-06-26 DIAGNOSIS — R2681 Unsteadiness on feet: Secondary | ICD-10-CM | POA: Insufficient documentation

## 2022-06-26 DIAGNOSIS — Z79899 Other long term (current) drug therapy: Secondary | ICD-10-CM | POA: Insufficient documentation

## 2022-06-26 DIAGNOSIS — I10 Essential (primary) hypertension: Secondary | ICD-10-CM | POA: Insufficient documentation

## 2022-06-26 LAB — BASIC METABOLIC PANEL
Anion gap: 8 (ref 5–15)
BUN: 22 mg/dL (ref 8–23)
CO2: 26 mmol/L (ref 22–32)
Calcium: 8.8 mg/dL — ABNORMAL LOW (ref 8.9–10.3)
Chloride: 108 mmol/L (ref 98–111)
Creatinine, Ser: 0.67 mg/dL (ref 0.61–1.24)
GFR, Estimated: >60 mL/min (ref >60)
Glucose, Bld: 96 mg/dL (ref 70–99)
Potassium: 3.6 mmol/L (ref 3.5–5.1)
Sodium: 142 mmol/L (ref 135–145)

## 2022-06-26 LAB — PROTIME-INR
INR: 1.3 — ABNORMAL HIGH (ref 0.8–1.2)
Prothrombin Time: 16.1 seconds — ABNORMAL HIGH (ref 11.4–15.2)

## 2022-06-26 LAB — CBC WITH DIFFERENTIAL/PLATELET
Abs Immature Granulocytes: 0.02 10*3/uL (ref 0.00–0.07)
Basophils Absolute: 0 10*3/uL (ref 0.0–0.1)
Basophils Relative: 1 %
Eosinophils Absolute: 0 10*3/uL (ref 0.0–0.5)
Eosinophils Relative: 1 %
HCT: 45.3 % (ref 39.0–52.0)
Hemoglobin: 14.9 g/dL (ref 13.0–17.0)
Immature Granulocytes: 0 %
Lymphocytes Relative: 13 %
Lymphs Abs: 0.8 10*3/uL (ref 0.7–4.0)
MCH: 29.9 pg (ref 26.0–34.0)
MCHC: 32.9 g/dL (ref 30.0–36.0)
MCV: 90.8 fL (ref 80.0–100.0)
Monocytes Absolute: 0.8 10*3/uL (ref 0.1–1.0)
Monocytes Relative: 13 %
Neutro Abs: 4.5 10*3/uL (ref 1.7–7.7)
Neutrophils Relative %: 72 %
Platelets: 237 10*3/uL (ref 150–400)
RBC: 4.99 MIL/uL (ref 4.22–5.81)
RDW: 13.1 % (ref 11.5–15.5)
WBC: 6.3 10*3/uL (ref 4.0–10.5)
nRBC: 0 % (ref 0.0–0.2)

## 2022-06-26 LAB — T4, FREE: Free T4: 0.99 ng/dL (ref 0.61–1.12)

## 2022-06-26 LAB — TSH: TSH: 1.31 u[IU]/mL (ref 0.350–4.500)

## 2022-06-26 LAB — SEDIMENTATION RATE: Sed Rate: 6 mm/hr (ref 0–20)

## 2022-06-26 LAB — C-REACTIVE PROTEIN: CRP: 0.7 mg/dL (ref <1.0)

## 2022-06-26 LAB — APTT: aPTT: 28 seconds (ref 24–36)

## 2022-06-26 MED ORDER — STROKE: EARLY STAGES OF RECOVERY BOOK: Freq: Once | Status: DC

## 2022-06-26 MED ORDER — SODIUM CHLORIDE 0.9 % IV SOLN: INTRAVENOUS | Status: DC

## 2022-06-26 MED ORDER — ASPIRIN 300 MG RE SUPP: 300.0000 mg | Freq: Every day | RECTAL | Status: DC

## 2022-06-26 MED ORDER — SODIUM CHLORIDE 0.9 % IV SOLN
500.0000 mg | INTRAVENOUS | Status: DC
  Administered 2022-06-26: 500 mg via INTRAVENOUS
  Filled 2022-06-26: qty 4

## 2022-06-26 MED ORDER — ACETAMINOPHEN 160 MG/5ML PO SOLN: 650.0000 mg | ORAL | Status: DC | PRN

## 2022-06-26 MED ORDER — IOHEXOL 350 MG/ML SOLN
75.0000 mL | Freq: Once | INTRAVENOUS | Status: AC | PRN
  Administered 2022-06-26: 75 mL via INTRAVENOUS

## 2022-06-26 MED ORDER — ASPIRIN 325 MG PO TABS
325.0000 mg | ORAL_TABLET | Freq: Every day | ORAL | Status: DC
  Administered 2022-06-26: 325 mg via ORAL
  Filled 2022-06-26: qty 1

## 2022-06-26 MED ORDER — ACETAMINOPHEN 650 MG RE SUPP: 650.0000 mg | RECTAL | Status: DC | PRN

## 2022-06-26 MED ORDER — HEPARIN SODIUM (PORCINE) 5000 UNIT/ML IJ SOLN
5000.0000 [IU] | Freq: Three times a day (TID) | INTRAMUSCULAR | Status: DC
  Administered 2022-06-26 – 2022-06-27 (×2): 5000 [IU] via SUBCUTANEOUS
  Filled 2022-06-26 (×2): qty 1

## 2022-06-26 MED ORDER — ACETAMINOPHEN 325 MG PO TABS: 650.0000 mg | ORAL_TABLET | ORAL | Status: DC | PRN

## 2022-06-26 NOTE — H&P (Signed)
History and Physical    Chief Complaint: Right eye vision loss   HISTORY OF PRESENT ILLNESS: Timothy Wang is an 71 y.o. male sent by [facility] Patient suspicion of stroke concern for giant cell arteritis patient had painless loss of vision at 10 AM reports that patient lost peripheral tunnel vision.  Patient had a dilated eye exam with concern for central retinal artery occlusion was sent to the emergency room for further workup with CT angiogram of head and neck and inflammatory markers giant cell arteritis. Pt states he was having floaters in right eye and today was suppose to have procedure where they remove scar tissue from cataract. When he told them about the vision loss they sent him here for eye exam.   Pt has: Past Medical History:  Diagnosis Date   Allergic rhinitis due to pollen    Cataract 2020   right eye   Depression    GERD (gastroesophageal reflux disease)    H/O: Bell's palsy    Hyperlipidemia    Hypertension    Ventral hernia   Review of Systems  Eyes:  Positive for visual disturbance.  All other systems reviewed and are negative.  No Known Allergies Past Surgical History:  Procedure Laterality Date   CATARACT EXTRACTION W/PHACO Right 04/20/2020   Procedure: CATARACT EXTRACTION PHACO AND INTRAOCULAR LENS PLACEMENT (IOC) RIGHT 11.14 00:53.9;  Surgeon: Birder Robson, MD;  Location: [place];  Service: Ophthalmology;  Laterality: Right;   CATARACT EXTRACTION W/PHACO Left 05/11/2020   Procedure: CATARACT EXTRACTION PHACO AND INTRAOCULAR LENS PLACEMENT (IOC) LEFT 4.98 00:28.8 ;  Surgeon: Birder Robson, MD;  Location: [place];  Service: Ophthalmology;  Laterality: Left;   COLONOSCOPY     COLONOSCOPY WITH PROPOFOL N/A 05/04/2022   Procedure: COLONOSCOPY WITH BIOPSY;  Surgeon: Lucilla Lame, MD;  Location: [place];  Service: Endoscopy;  Laterality: N/A;   HEEL SPUR SURGERY  1987   KNEE SURGERY  2015   POLYPECTOMY  N/A 05/04/2022   Procedure: POLYPECTOMY;  Surgeon: Lucilla Lame, MD;  Location: [place];  Service: Endoscopy;  Laterality: N/A;   TONSILLECTOMY  1960   VENTRAL HERNIA REPAIR N/A 11/03/2016   Procedure: HERNIA REPAIR VENTRAL ADULT WITH MESH;  Surgeon: Clayburn Pert, MD;  Location: [facility];  Service: General;  Laterality: N/A;   Social History   Socioeconomic History   Marital status: Single    Spouse name: Not on file   Number of children: Not on file   Years of education: Not on file   Highest education level: Not on file  Occupational History   Occupation: Scientist, clinical (histocompatibility and immunogenetics): [employer]  Tobacco Use   Smoking status: Former    Packs/day: 1.00    Years: 10.00    Total pack years: 10.00    Types: Cigarettes    Quit date: 10/27/2002    Years since quitting: 19.6   Smokeless tobacco: Never   Tobacco comments:    quit 2004  Vaping Use   Vaping Use: Never used  Substance and Sexual Activity   Alcohol use: Yes    Comment: Occassionally beer and wine   Drug use: No   Sexual activity: Not on file  Other Topics Concern   Not on file  Social History Narrative   Not on file   Social Determinants of Health   Financial Resource Strain: Low Risk  (03/31/2022)   Overall Financial Resource Strain (CARDIA)    Difficulty of Paying Living  Expenses: Not hard at all  Food Insecurity: No Food Insecurity (03/31/2022)   Hunger Vital Sign    Worried About Running Out of Food in the Last Year: Never true    Ran Out of Food in the Last Year: Never true  Transportation Needs: No Transportation Needs (03/31/2022)   PRAPARE - Hydrologist (Medical): No    Lack of Transportation (Non-Medical): No  Physical Activity: Inactive (03/31/2022)   Exercise Vital Sign    Days of Exercise per Week: 0 days    Minutes of Exercise per Session: 0 min  Stress: No Stress Concern Present (03/31/2022)   [place]     Feeling of Stress : Not at all  Social Connections: Not on file      CURRENT MEDS: Current Outpatient Medications  Medication Instructions   acetaminophen (TYLENOL) 325 mg, Oral, Daily PRN   azelastine (ASTELIN) 0.1 % nasal spray 1 spray, Each Nare, Daily, As needed   meloxicam (MOBIC) 7.5 mg, Oral, Daily      ED Course: Pt in Ed alert awake oriented afebrile O2 sats of 96% blood pressure high as below.   Vitals:   06/26/22 1500 06/26/22 1700 06/26/22 1800 06/26/22 2002  BP: (!) 147/97 (!) 152/88 139/89 (!) 144/95  Pulse: (!) 104 97 99 93  Resp:  '18 18 14  '$ Temp: 98.9 F (37.2 C)     TempSrc: Oral     SpO2: 96%     No intake/output data recorded. SpO2: 96 % on room air.  Basic Metabolic Panel: Recent Labs  Lab 06/26/22 1503  NA 142  K 3.6  CL 108  CO2 26  GLUCOSE 96  BUN 22  CREATININE 0.67  CALCIUM 8.8*    Coagulation profile Recent Labs  Lab 06/26/22 1619  INR 1.3*    CBC: Recent Labs  Lab 06/26/22 1503  WBC 6.3  NEUTROABS 4.5  HGB 14.9  HCT 45.3  MCV 90.8  PLT 237    Sepsis Labs Recent Labs  Lab 06/26/22 1503  WBC 6.3   Microbiology No results found for this or any previous visit (from the past 240 hour(s)).  Unresulted Labs (From admission, onward)     Start     Ordered   06/27/22 0500  Lipid panel  (Labs)  Tomorrow morning,   URGENT       Comments: Fasting    06/26/22 1927   06/26/22 1944  ANA w/Reflex  Add-on,   AD        06/26/22 1943   06/26/22 1944  RPR  Add-on,   AD        06/26/22 1943   06/26/22 1928  High sensitivity CRP  Once,   R        06/26/22 1927   06/26/22 1921  HIV Antibody (routine testing w rflx)  (HIV Antibody (Routine testing w reflex) panel)  Once,   URGENT        06/26/22 1927   06/26/22 1921  Urine rapid drug screen (hosp performed)not at [facility])  Once,   STAT        06/26/22 1927   06/26/22 1921  Hemoglobin A1c  (Labs)  Once,   URGENT       Comments: To assess prior glycemic control     06/26/22 1927           Orders Placed This Encounter  Procedures   Critical  Care    This order was created via procedure documentation    Standing Status:   Standing    Number of Occurrences:   1   CT ANGIO HEAD NECK W WO CM    Standing Status:   Standing    Number of Occurrences:   1    Order Specific Question:   Does the patient have a contrast media/X-ray dye allergy?    Answer:   No    Order Specific Question:   If indicated for the ordered procedure, I authorize the administration of contrast media per Radiology protocol    Answer:   Yes   CBC with Differential    Standing Status:   Standing    Number of Occurrences:   1   Basic metabolic panel    Standing Status:   Standing    Number of Occurrences:   1   Sedimentation rate    Standing Status:   Standing    Number of Occurrences:   1   C-reactive protein    Standing Status:   Standing    Number of Occurrences:   1   Protime-INR    Standing Status:   Standing    Number of Occurrences:   1   APTT    Standing Status:   Standing    Number of Occurrences:   1   Diet NPO time specified    NPO until stroke swallow screen is complete    Standing Status:   Standing    Number of Occurrences:   1   Vital signs    Standing Status:   Standing    Number of Occurrences:   1   Cardiac Monitoring    Standing Status:   Standing    Number of Occurrences:   1   NIH stroke score    On arrival and q shift    Standing Status:   Standing    Number of Occurrences:   1   Modified Stroke Scale (mNIHSS) Document mNIHSS assessment every 2 hours for a total of 12 hours    Document mNIHSS assessment every 2 hours for a total of 12 hours    Standing Status:   Standing    Number of Occurrences:   1   Swallow screen    Standing Status:   Standing    Number of Occurrences:   1   Initiate Carrier Fluid Protocol    Standing Status:   Standing    Number of Occurrences:   1   If O2 sat If O2 Sat < 94%, administer O2 at 2 liters/minute  via nasal cannula.    If O2 Sat < 94%, administer O2 at 2 liters/minute via nasal cannula.    Standing Status:   Standing    Number of Occurrences:   1   Consult to hospitalist    Standing Status:   Standing    Number of Occurrences:   1    Order Specific Question:   Place call to:    Answer:   [phone]    Order Specific Question:   Reason for Consult    Answer:   Admit   Pulse oximetry, continuous    Standing Status:   Standing    Number of Occurrences:   1   ED EKG    Stroke Symptoms    Standing Status:   Standing    Number of Occurrences:   1    Order Specific Question:  Reason for Exam    Answer:   Other (See Comments)   EKG 12-Lead    Standing Status:   Standing    Number of Occurrences:   1   Saline lock IV    Standing Status:   Standing    Number of Occurrences:   1      Admission Imaging : CT ANGIO HEAD NECK W WO CM  Result Date: 06/26/2022 CLINICAL DATA:  Stroke/TIA, determine embolic source. Right-sided vision loss. EXAM: CT ANGIOGRAPHY HEAD AND NECK TECHNIQUE: Multidetector CT imaging of the head and neck was performed using the standard protocol during bolus administration of intravenous contrast. Multiplanar CT image reconstructions and MIPs were obtained to evaluate the vascular anatomy. Carotid stenosis measurements (when applicable) are obtained utilizing NASCET criteria, using the distal internal carotid diameter as the denominator. RADIATION DOSE REDUCTION: This exam was performed according to the departmental dose-optimization program which includes automated exposure control, adjustment of the mA and/or kV according to patient size and/or use of iterative reconstruction technique. CONTRAST:  61m OMNIPAQUE IOHEXOL 350 MG/ML SOLN COMPARISON:  Head CT 01/14/2007 and MRI 01/21/2007 FINDINGS: CT HEAD FINDINGS Brain: There is no evidence of an acute infarct, intracranial hemorrhage, mass, midline shift, or extra-axial fluid collection. Mild cerebral atrophy is within  normal limits for age. Cerebral white matter hypodensities are nonspecific but compatible with minimal chronic small vessel ischemic disease. Vascular: No hyperdense vessel. Skull: No acute fracture or suspicious osseous lesion. Sinuses/Orbits: Paranasal sinuses and mastoid air cells are clear. Bilateral cataract extraction. Other: None. Review of the MIP images confirms the above findings CTA NECK FINDINGS Aortic arch: Standard 3 vessel aortic arch with mild atherosclerotic plaque. Widely patent arch vessel origins. Right carotid system: Patent with a small amount calcified plaque at the carotid bifurcation. No evidence of a significant stenosis or dissection. Left carotid system: Patent with a moderate amount of calcified plaque at the carotid bifurcation. No evidence of a significant stenosis or dissection. Vertebral arteries: Patent without evidence of a significant stenosis or dissection. Dominant left vertebral artery. Skeleton: Advanced cervical disc degeneration and asymmetrically advanced right-sided cervical facet arthrosis. Other neck: No evidence of cervical lymphadenopathy or mass. Upper chest: Clear lung apices. Review of the MIP images confirms the above findings CTA HEAD FINDINGS Anterior circulation: The internal carotid arteries are patent from skull base to carotid termini with minimal atherosclerotic plaque bilaterally not resulting in significant stenosis. ACAs and MCAs are patent with asymmetric attenuation of distal right MCA branch vessels but without evidence of a proximal branch occlusion or flow limiting proximal stenosis. No aneurysm is identified. Posterior circulation: The intracranial vertebral arteries are patent to the basilar and small bilaterally. There is a severe stenosis of the proximal right V4 segment. The basilar artery is patent and congenitally small without a significant focal stenosis. There are large posterior communicating arteries and diminutive or absent P1 segments  bilaterally. Both PCAs are patent without evidence of a significant proximal stenosis. No aneurysm is identified. Venous sinuses: As permitted by contrast timing, patent. Anatomic variants: Fetal origin of the PCAs. Review of the MIP images confirms the above findings  IMPRESSION: 1. No evidence of acute intracranial abnormality. 2. No large vessel occlusion. 3. Hypoplastic vertebrobasilar circulation. Severe stenosis of the proximal right V4 segment. 4. Cervical carotid atherosclerosis without significant stenosis. 5.  Aortic Atherosclerosis  (ICD10-I70.0). Electronically Signed   By: ALogan BoresM.D.   On: 06/26/2022 18:12    Physical Examination: Vitals:   06/26/22  1500 06/26/22 1700 06/26/22 1800 06/26/22 2002  BP: (!) 147/97 (!) 152/88 139/89 (!) 144/95  Pulse: (!) 104 97 99 93  Temp: 98.9 F (37.2 C)     Resp:  '18 18 14  '$ SpO2: 96%     TempSrc: Oral      Physical Exam Vitals and nursing note reviewed.  Constitutional:      General: He is not in acute distress.    Appearance: Normal appearance. He is not ill-appearing, toxic-appearing or diaphoretic.  HENT:     Head: Normocephalic and atraumatic.     Right Ear: Hearing and external ear normal.     Left Ear: Hearing and external ear normal.     Nose: Nose normal. No nasal deformity.     Mouth/Throat:     Lips: Pink.     Mouth: Mucous membranes are moist.     Tongue: No lesions.     Pharynx: Oropharynx is clear.  Eyes:     General: Lids are normal. Vision grossly intact.     Extraocular Movements: Extraocular movements intact.     Right eye: Normal extraocular motion and no nystagmus.     Left eye: Normal extraocular motion and no nystagmus.     Pupils: Pupils are equal, round, and reactive to light.     Comments: DILATED PUPILS 4 MM BL DUE TO MEDS.    Neck:     Vascular: No carotid bruit.  Cardiovascular:     Rate and Rhythm: Normal rate and regular rhythm.     Pulses: Normal pulses.     Heart sounds: Murmur heard.      Systolic murmur is present with a grade of 4/6.  Pulmonary:     Effort: Pulmonary effort is normal.     Breath sounds: Normal breath sounds.  Chest:       Comments: LOCATION OF MURMUR. No radiation. Abdominal:     General: Bowel sounds are normal. There is no distension.     Palpations: Abdomen is soft. There is no mass.     Tenderness: There is no abdominal tenderness. There is no guarding.     Hernia: No hernia is present.  Musculoskeletal:     Right lower leg: No edema.     Left lower leg: No edema.  Skin:    General: Skin is warm.  Neurological:     General: No focal deficit present.     Mental Status: He is alert and oriented to person, place, and time.     Cranial Nerves: Cranial nerves 2-12 are intact.     Motor: Motor function is intact.  Psychiatric:        Attention and Perception: Attention normal.        Mood and Affect: Mood normal.        Speech: Speech normal.        Behavior: Behavior normal. Behavior is cooperative.        Cognition and Memory: Cognition normal.    Assessment and Plan: * Vision loss, right eye Differentials include ophthalmic, neurologic etiology. Patient presenting with visual loss that is central and peripheral with preserved vision centrally 20%. Patient has no history of any medical conditions and does not take any meds. Ophthalmology recommended patient be started on empiric treatment for GCA along with angio studies and MRI of the neck. We will get ophthalmology consult.  Cont with antiplatelet with Asprin and statin therapy.  Also start with 3 days of 500 mg solumedrol .  Essential hypertension Pt is not on any treatment.  Has htn on monitor with bp as high as 140;s. We will not treat until tomorrow, unless htn emergency or urgency with sbp 200 and over. EKG does show LVH, Prolonged PR interval . Suspect untreated htn.  Pt was a smoker and has quit.     DVT prophylaxis:  Heparin    Code Status:  Full Code    Family  Communication:  Biswell,Carolyn (Sister) [phone]     Disposition Plan:  Home    Consults called:  Ophthalmology.   Admission status: observation   Unit/ Expected LOS: Med tele /  2 days.    Para Skeans MD [facility]  6 PM- 2 AM. Please contact me via secure Chat 6 PM-2 AM. [phone] ( Pager ) To contact the [facility] Attending or Consulting provider [place] or covering provider during after hours [place], for this patient.   Check the care team in [facility] and look for a) attending/consulting [facility] provider listed and b) the [facility] team listed Log into [email or web address] and use [place]'s universal password to access. If you do not have the password, please contact the hospital operator. Locate the [facility] provider you are looking for under [facility] and page to a number that you can be directly reached. If you still have difficulty reaching the provider, please page the [facility] (Director on Call) for the Hospitalists listed on amion for assistance. [email or web address] 06/26/2022, 9:06 PM

## 2022-06-26 NOTE — Assessment & Plan Note (Addendum)
Differentials include ophthalmic, neurologic etiology. Patient presenting with visual loss that is central and peripheral with preserved vision centrally 20%. Patient has no history of any medical conditions and does not take any meds. Ophthalmology recommended patient be started on empiric treatment for GCA along with angio studies and MRI of the neck. We will get ophthalmology consult.  Cont with antiplatelet with Asprin and statin therapy.  Also start with 3 days of 500 mg solumedrol .

## 2022-06-26 NOTE — ED Provider Notes (Signed)
[facility] Provider Note    Event Date/Time   First MD Initiated Contact with Patient 06/26/22 1507     (approximate)   History   Eye Problem   HPI  Timothy Wang is a 71 y.o. male   Past medical history of hyperlipidemia, hypertension who presents to the emergency department for stroke work-up in the setting of suspected central no artery occlusion sent in from [facility] by Dr. Sandra Cockayne -at the [facility] for a procedure when he noted to the ophthalmologist that he had sudden onset vision loss in the right eye, painless, around 10 AM this morning.  He states that peripheral vision is poor and he has sensation of tunnel vision.  I spoke with Dr. Wallace Going  of [facility] who stated that the patient had very poor visual acuity in the right eye, had a dilated exam, concern for central no artery occlusion from that exam and was sent to the emergency department for further work-up CT angiogram of head and neck and recommended inflammatory markers for giant cell arteritis as well.  Patient has no other complaints and no other neurologic deficits reported to me.  History was obtained via the patient. I spoke with Dr. Wallace Going who acted as an independent historian of [facility] for collateral information as above      Physical Exam   Triage Vital Signs: ED Triage Vitals [06/26/22 1500]  Enc Vitals Group     BP (!) 147/97     Pulse Rate (!) 104     Resp      Temp 98.9 F (37.2 C)     Temp Source Oral     SpO2 96 %     Weight      Height      Head Circumference      Peak Flow      Pain Score      Pain Loc      Pain Edu?      Excl. in [place]?     Most recent vital signs: Vitals:   06/26/22 1500  BP: (!) 147/97  Pulse: (!) 104  Temp: 98.9 F (37.2 C)  SpO2: 96%    General: Awake, no distress.  CV:  Good peripheral perfusion.  Resp:  Normal effort.  Abd:  No distention.  Other:  Bilateral eyes are dilated,  visual acuity is very poor in the right eye, is able to see and count fingers only.  Extraocular movements intact.  No proptosis.  Neurological exam is otherwise unremarkable no facial asymmetry, no motor or sensory deficits, stable gait.  Normal finger-to-nose.   ED Results / Procedures / Treatments   Labs (all labs ordered are listed, but only abnormal results are displayed) Labs Reviewed  BASIC METABOLIC PANEL - Abnormal; Notable for the following components:      Result Value   Calcium 8.8 (*)    All other components within normal limits  PROTIME-INR - Abnormal; Notable for the following components:   Prothrombin Time 16.1 (*)    INR 1.3 (*)    All other components within normal limits  CBC WITH DIFFERENTIAL/PLATELET  SEDIMENTATION RATE  APTT  C-REACTIVE PROTEIN     I reviewed labs and they are notable for blood sugar of 96  EKG  ED ECG REPORT I, Lucillie Garfinkel, the attending physician, personally viewed and interpreted this ECG.   Date: 06/26/2022  EKG Time: 1654  Rate: 98  Rhythm: normal sinus  rhythm  Axis: nl  Intervals:none  ST&T Change: No acute ischemic changes, poor R wave progression.    RADIOLOGY I independently reviewed and interpreted CT of the head without contrast and see no obvious bleeding or midline shift.   PROCEDURES:  Critical Care performed: Yes, see critical care procedure note(s)  .Critical Care  Performed by: Lucillie Garfinkel, MD Authorized by: Lucillie Garfinkel, MD   Critical care provider statement:    Critical care time (minutes):  30   Critical care was necessary to treat or prevent imminent or life-threatening deterioration of the following conditions:  CNS failure or compromise   Critical care was time spent personally by me on the following activities:  Development of treatment plan with patient or surrogate, discussions with consultants, evaluation of patient's response to treatment, examination of patient, ordering and review of laboratory  studies, ordering and review of radiographic studies, ordering and performing treatments and interventions, pulse oximetry, re-evaluation of patient's condition and review of old charts    MEDICATIONS ORDERED IN ED: Medications  iohexol (OMNIPAQUE) 350 MG/ML injection 75 mL (75 mLs Intravenous Contrast Given 06/26/22 1709)    Consultants:  I spoke with Dr Wallace Going of optho regarding care plan for this patient.   IMPRESSION / MDM / ASSESSMENT AND PLAN / ED COURSE  I reviewed the triage vital signs and the nursing notes.                              Differential diagnosis includes, but is not limited to, intra retinal artery occlusion, TIA, stroke, giant cell arteritis, other ophthalmologic pathologies like retinal detachment, vitreous detachment/hemorrhage   The patient is on the cardiac monitor to evaluate for evidence of arrhythmia and/or significant heart rate changes.  MDM: Concern for CVA or central retinal artery occlusion sent in from ophthalmology, will initiate stroke work-up including labs and CT angiogram of the head and neck.  He is outside of the lytic window.  Patient is stable without other complaints at this time, no other neurologic deficits.   Patient's presentation is most consistent with acute presentation with potential threat to life or bodily function.       FINAL CLINICAL IMPRESSION(S) / ED DIAGNOSES   Final diagnoses:  Vision loss     Rx / DC Orders   ED Discharge Orders     None        Note:  This document was prepared using Dragon voice recognition software and may include unintentional dictation errors.    Lucillie Garfinkel, MD 06/26/22 1721

## 2022-06-26 NOTE — ED Notes (Signed)
Pt to MRI

## 2022-06-26 NOTE — ED Triage Notes (Addendum)
Pt was going to have a cataract laser  procedure done today but was found to have a swollen optic nerve. Procedure was cancelled and he was sent her for further testing. Vision in the right eye went black this morning prior to his appointment. Pt states the vision comes and goes.Pt states it feels like he is looking through a tunnel on the right side.

## 2022-06-26 NOTE — Assessment & Plan Note (Addendum)
Pt is not on any treatment.  Has htn on monitor with bp as high as 140;s. We will not treat until tomorrow, unless htn emergency or urgency with sbp 200 and over. EKG does show LVH, Prolonged PR interval . Suspect untreated htn.  Pt was a smoker and has quit.

## 2022-06-27 ENCOUNTER — Other Ambulatory Visit: Payer: Self-pay

## 2022-06-27 DIAGNOSIS — H5461 Unqualified visual loss, right eye, normal vision left eye: Secondary | ICD-10-CM | POA: Diagnosis not present

## 2022-06-27 LAB — LIPID PANEL
Cholesterol: 192 mg/dL (ref 0–200)
HDL: 47 mg/dL (ref >40)
LDL Cholesterol: 141 mg/dL — ABNORMAL HIGH (ref 0–99)
Total CHOL/HDL Ratio: 4.1 RATIO
Triglycerides: 22 mg/dL (ref <150)
VLDL: 4 mg/dL (ref 0–40)

## 2022-06-27 LAB — URINE DRUG SCREEN, QUALITATIVE (ARMC ONLY)
Amphetamines, Ur Screen: NEGATIVE — AB
Barbiturates, Ur Screen: NEGATIVE — AB
Benzodiazepine, Ur Scrn: NEGATIVE — AB
Cannabinoid 50 Ng, Ur ~~LOC~~: NEGATIVE — AB
Cocaine Metabolite,Ur ~~LOC~~: NEGATIVE — AB
MDMA (Ecstasy)Ur Screen: NEGATIVE — AB
Methadone Scn, Ur: NEGATIVE — AB
Opiate, Ur Screen: NEGATIVE — AB
Phencyclidine (PCP) Ur S: NEGATIVE — AB
Tricyclic, Ur Screen: NEGATIVE — AB

## 2022-06-27 LAB — HIV ANTIBODY (ROUTINE TESTING W REFLEX): HIV Screen 4th Generation wRfx: NONREACTIVE

## 2022-06-27 LAB — RPR: RPR Ser Ql: NONREACTIVE

## 2022-06-27 MED ORDER — ROSUVASTATIN CALCIUM 5 MG PO TABS: 5.0000 mg | ORAL_TABLET | Freq: Every day | ORAL | 1 refills | Status: DC

## 2022-06-27 NOTE — Evaluation (Signed)
Physical Therapy Evaluation Patient Details Name: Timothy Wang MRN: [id number] DOB: 02/01/1951 Today's Date: 06/27/2022  History of Present Illness  Timothy Wang is an 71 y.o. male sent by [facility]  Patient suspicion of stroke concern for giant cell arteritis patient had painless loss of vision at 10 AM reports that patient lost peripheral tunnel vision.  Patient had a dilated eye exam with concern for central retinal artery occlusion was sent to the emergency room for further workup with CT angiogram of head and neck and inflammatory markers giant cell arteritis.  Pt states he was having floaters in right eye and today was suppose to have procedure where they remove scar tissue from cataract. When he told them about the vision loss they sent him here for eye exam. Pt has been treated with antiinflamatory which has helped improve his vision and L hip pain.  Clinical Impression  Pt received in bed agreeable to PT evaluation.  Pt reported of improved vision as compared to yesterday with continued blurriness. Pt feels that the antiinflammatory has helped his vision and his L hip.  Pt PLOF is Independent with household level and community level activity participation. Pt drives and Independent with ADLS And IADLs.  PT assessment reveals Elevated BP in supine 159/101 in sitting 168/104 and in standing 161/102, after activities 160/110 MAP 126. Pt ambulated with sup pushing IV pole with sup. Pt has mild sways because pt favors L hip 2/2 OA. Pt has recently began to use [facility] which has helped pt with balance. Pt unable to stand on RLE. Pt advised to continue to use cane to prevent falls. Today pt needed Sup 2/2 elevated BP and high MAP with activities. PT will continue while in acute. Pt will benefit form out pt PT to improve balance and safety to return to safe PLOF.     Recommendations for follow up therapy are one component of a multi-disciplinary discharge planning process, led by the attending  physician.  Recommendations may be updated based on patient status, additional functional criteria and insurance authorization.  Follow Up Recommendations Outpatient PT      Assistance Recommended at Discharge Set up Supervision/Assistance  Patient can return home with the following  Other (comment) (Pt may benefit form out patient PT to imporve safety.)    Equipment Recommendations None recommended by PT  Recommendations for Other Services       Functional Status Assessment Patient has had a recent decline in their functional status and demonstrates the ability to make significant improvements in function in a reasonable and predictable amount of time.     Precautions / Restrictions Precautions Precautions: Fall Restrictions Weight Bearing Restrictions: No      Mobility  Bed Mobility Overal bed mobility: Independent                  Transfers Overall transfer level: Independent Equipment used: None                    Ambulation/Gait Ambulation/Gait assistance: Supervision Gait Distance (Feet): 99 Feet Assistive device: IV Pole Gait Pattern/deviations: Decreased stride length Gait velocity: dec        Stairs: did  not attempted 2/2 to elevated BP.             Wheelchair Mobility    Modified Rankin (Stroke Patients Only)       Balance Overall balance assessment: Needs assistance Sitting-balance support: Feet supported Sitting balance-Leahy Scale: Normal     Standing balance  support: Single extremity supported Standing balance-Leahy Scale: Good   Single Leg Stance - Right Leg: 0 Single Leg Stance - Left Leg: 10 Tandem Stance - Right Leg: 20 Tandem Stance - Left Leg: 20 Rhomberg - Eyes Opened: 20 Rhomberg - Eyes Closed: 20   High Level Balance Comments: Pt needs sup 2/2 to BP elevaterd this  morning.             Pertinent Vitals/Pain Pain Assessment Pain Assessment: No/denies pain    Home Living Family/patient expects  to be discharged to:: Private residence Living Arrangements: Alone Available Help at Discharge:  (none) Type of Home: House Home Access: Stairs to enter Entrance Stairs-Rails: Can reach both Entrance Stairs-Number of Steps: 2   Home Layout: One level Home Equipment: Cane - single point      Prior Function Prior Level of Function : Independent/Modified Independent             Mobility Comments: Pt independent with household and community level activity participation. Recently pt began to use SPC 2/2 to intermittent hip and back pain. ADLs Comments: Independent with cooking, driving, eating, IADLs.     Hand Dominance   Dominant Hand: Right    Extremity/Trunk Assessment   Upper Extremity Assessment Upper Extremity Assessment: Overall WFL for tasks assessed    Lower Extremity Assessment Lower Extremity Assessment: Generalized weakness (2/2 to hip arthritis.)       Communication   Communication: No difficulties;HOH  Cognition Arousal/Alertness: Awake/alert Behavior During Therapy: WFL for tasks assessed/performed Overall Cognitive Status: Within Functional Limits for tasks assessed                                          General Comments      Exercises     Assessment/Plan    PT Assessment Patient needs continued PT services  PT Problem List Decreased strength;Decreased balance       PT Treatment Interventions Gait training;Stair training;Therapeutic exercise;Therapeutic activities;Balance training    PT Goals (Current goals can be found in the Care Plan section)  Acute Rehab PT Goals Patient Stated Goal: " I can see how I need to improve my balance." PT Goal Formulation: With patient Time For Goal Achievement: 07/04/22 Potential to Achieve Goals: Good Additional Goals Additional Goal #1: Pt will be able to stand on RLE without LOB for 20 secs to improve safety with all funcitonal mobility and return to PLOF.    Frequency Min  2X/week     Co-evaluation               AM-PAC PT "6 Clicks" Mobility  Outcome Measure Help needed turning from your back to your side while in a flat bed without using bedrails?: None Help needed moving from lying on your back to sitting on the side of a flat bed without using bedrails?: None Help needed moving to and from a bed to a chair (including a wheelchair)?: A Little Help needed standing up from a chair using your arms (e.g., wheelchair or bedside chair)?: None Help needed to walk in hospital room?: A Little Help needed climbing 3-5 steps with a railing? : A Little 6 Click Score: 21    End of Session Equipment Utilized During Treatment: Gait belt Activity Tolerance: Patient tolerated treatment well Patient left: in bed;with call bell/phone within reach;with bed alarm set;with nursing/sitter in room Nurse Communication: Mobility status (BP  findings) PT Visit Diagnosis: Unsteadiness on feet (R26.81)    Time: 8343-7357 PT Time Calculation (min) (ACUTE ONLY): 39 min   Charges:   PT Evaluation $PT Eval Low Complexity: 1 Low PT Treatments $Gait Training: 8-22 mins $Therapeutic Activity: 8-22 mins      Xaidyn Kepner PT DPT 10:36 AM,06/27/22

## 2022-06-27 NOTE — Evaluation (Signed)
Occupational Therapy Evaluation Patient Details Name: Timothy Wang MRN: [id number] DOB: Mar 05, 1951 Today's Date: 06/27/2022   History of Present Illness Timothy Wang is an 71 y.o. male sent by [facility]  Patient suspicion of stroke concern for giant cell arteritis patient had painless loss of vision at 10 AM reports that patient lost peripheral tunnel vision.  Patient had a dilated eye exam with concern for central retinal artery occlusion was sent to the emergency room for further workup with CT angiogram of head and neck and inflammatory markers giant cell arteritis.  Pt states he was having floaters in right eye and today was suppose to have procedure where they remove scar tissue from cataract. When he told them about the vision loss they sent him here for eye exam. Pt has been treated with antiinflamatory which has helped improve his vision and L hip pain.   Clinical Impression   Upon entering the room, pt supine in bed and agreeable to OT intervention. Pt reports living alone at mod I level with use of SPC for mobility. Pt reports recent loss of vision prior to hospital admission. Vision test shows very limited peripheral vision. Pt has been compensating on his own for this vision loss. Did speak to him regarding concerns with driving as he likely needs to be cleared from a physician to drive for safety. Pt is performing mobility and self care tasks at baseline level. Pt could benefit from outpatient OT to see a vision specialist. No skilled OT need at this time. OT to complete orders.      Recommendations for follow up therapy are one component of a multi-disciplinary discharge planning process, led by the attending physician.  Recommendations may be updated based on patient status, additional functional criteria and insurance authorization.   Follow Up Recommendations  Outpatient OT     Assistance Recommended at Discharge PRN  Patient can return home with the following Assist  for transportation    Functional Status Assessment  Patient has not had a recent decline in their functional status  Equipment Recommendations  None recommended by OT       Precautions / Restrictions Precautions Precautions: Fall Restrictions Weight Bearing Restrictions: No      Mobility Bed Mobility Overal bed mobility: Independent                  Transfers Overall transfer level: Independent Equipment used: None                      Balance Overall balance assessment: Needs assistance Sitting-balance support: Feet supported Sitting balance-Leahy Scale: Normal     Standing balance support: Single extremity supported Standing balance-Leahy Scale: Good                             ADL either performed or assessed with clinical judgement   ADL Overall ADL's : Modified independent                                             Vision Patient Visual Report: No change from baseline              Pertinent Vitals/Pain Pain Assessment Pain Assessment: No/denies pain     Hand Dominance Right   Extremity/Trunk Assessment Upper Extremity Assessment Upper Extremity Assessment: Overall WFL for  tasks assessed   Lower Extremity Assessment Lower Extremity Assessment: Overall WFL for tasks assessed;Generalized weakness       Communication Communication Communication: No difficulties;HOH   Cognition Arousal/Alertness: Awake/alert Behavior During Therapy: WFL for tasks assessed/performed Overall Cognitive Status: Within Functional Limits for tasks assessed                                                  Home Living Family/patient expects to be discharged to:: Private residence Living Arrangements: Alone   Type of Home: House Home Access: Stairs to enter [employer] of Steps: 2 Entrance Stairs-Rails: Can reach both Home Layout: One level     Bathroom Shower/Tub: Animal nutritionist: Standard Bathroom Accessibility: Yes   Home Equipment: Cane - single point          Prior Functioning/Environment Prior Level of Function : Independent/Modified Independent             Mobility Comments: Pt independent with household and community level activity participation. Recently pt began to use SPC 2/2 to intermittent hip and back pain. ADLs Comments: Independent with cooking, driving, eating, IADLs.                 OT Goals(Current goals can be found in the care plan section) Acute Rehab OT Goals Patient Stated Goal: to go home and return to PLOF OT Goal Formulation: With patient Time For Goal Achievement: 06/27/22 Potential to Achieve Goals: Good  OT Frequency:         AM-PAC OT "6 Clicks" Daily Activity     Outcome Measure Help from another person eating meals?: None Help from another person taking care of personal grooming?: None Help from another person toileting, which includes using toliet, bedpan, or urinal?: None Help from another person bathing (including washing, rinsing, drying)?: None Help from another person to put on and taking off regular upper body clothing?: None Help from another person to put on and taking off regular lower body clothing?: None 6 Click Score: 24   End of Session    Activity Tolerance: Patient tolerated treatment well Patient left: in bed;with call bell/phone within reach;with nursing/sitter in room                   Time: 0922-0937 OT Time Calculation (min): 15 min Charges:  OT General Charges $OT Visit: 1 Visit OT Evaluation $OT Eval Low Complexity: 1 Low  Darleen Crocker, MS, OTR/L , CBIS ascom [phone]  06/27/22, 12:09 PM

## 2022-06-27 NOTE — Progress Notes (Signed)
SLP Cancellation Note  Patient Details Name: Timothy Wang MRN: [id number] DOB: 1951/02/29   Cancelled treatment:       Reason Eval/Treat Not Completed: SLP screened, no needs identified, will sign off (chart reviewed; consulted NSG and met w/ pt in room. Pt walking around room upon entering.) Pt denied any difficulty swallowing and is currently on a regular diet; tolerates swallowing pills w/ water per NSG. He requested coffee and enjoyed drinking it during session(no overt swallowing difficulties noted).  Pt conversed in much conversation w/out expressive/receptive deficits noted; pt denied any speech-language deficits. Speech clear, intelligible. He enjoyed conversing on several topics. No further skilled ST services indicated as pt appears at his baseline. Pt agreed. NSG to reconsult if any change in status while admitted.       Orinda Kenner, [place], CCC-SLP Speech Language Pathologist Rehab Services; [place] [phone] (ascom) [place] 06/27/2022, 10:32 AM

## 2022-06-27 NOTE — TOC Initial Note (Signed)
Transition of Care [facility]) - Initial/Assessment Note    Patient Details  Name: Timothy Wang MRN: [id number] Date of Birth: 1950/11/23  Transition of Care [facility]) CM/SW Contact:    Shelbie Hutching, RN Phone Number: 06/27/2022, 11:17 AM  Clinical Narrative:                 Patient placed under observation for vision loss, suspected stroke.  Workup negative for acute stroke.  PT recommending outpatient PT.  RNCM met with patient at the bedside, introduced self and explained role in DC planning.  Patient from home where he lives alone.  Patient drove here and will drive himself home.  He reports that he was already scheduled for OP therapy at [facility] - he can call them and get set up with OP PT through them.  No other TOC needs identified at this time.   Expected Discharge Plan: OP Rehab Barriers to Discharge: Barriers Resolved   Patient Goals and CMS Choice Patient states their goals for this hospitalization and ongoing recovery are:: Glad to be going home      Expected Discharge Plan and Services Expected Discharge Plan: OP Rehab   Discharge Planning Services: CM Consult   Living arrangements for the past 2 months: Single Family Home Expected Discharge Date: 06/27/22               DME Arranged: N/A DME Agency: NA       HH Arranged: NA [place] Agency: NA        Prior Living Arrangements/Services Living arrangements for the past 2 months: Single Family Home Lives with:: Self Patient language and need for interpreter reviewed:: Yes Do you feel safe going back to the place where you live?: Yes      Need for Family Participation in Patient Care: Yes (Comment) Care giver support system in place?: Yes (comment) Current home services: DME (cane) Criminal Activity/Legal Involvement Pertinent to Current Situation/Hospitalization: No - Comment as needed  Activities of Daily Living      Permission Sought/Granted                  Emotional Assessment Appearance::  Appears stated age Attitude/Demeanor/Rapport: Engaged Affect (typically observed): Accepting Orientation: : Oriented to Self, Oriented to Place, Oriented to  Time, Oriented to Situation Alcohol / Substance Use: Not Applicable Psych Involvement: No (comment)  Admission diagnosis:  Peripheral vision loss, right [H53.451] Patient Active Problem List   Diagnosis Date Noted   Vision loss, right eye 06/26/2022   Encounter for screening colonoscopy    Polyp of ascending colon    Essential hypertension 08/04/2010   Hyperlipidemia 05/28/2008   Depression, major, in remission ([place]) 04/21/2008   ALLERGIC RHINITIS 04/21/2008   PCP:  Owens Loffler, MD Pharmacy:   [id number] [id number]: [phone]: [phone]    Social Determinants of Health (SDOH) Interventions    Readmission Risk Interventions     No data to display

## 2022-06-27 NOTE — Discharge Summary (Signed)
Physician Discharge Summary  Timothy Wang DHR:[id number] DOB: June 21, 1951 DOA: 06/26/2022  PCP: Owens Loffler, MD  Admit date: 06/26/2022 Discharge date: 06/27/2022  Admitted From: Home Disposition:  Home  Discharge Condition:Stable CODE STATUS:FULL Diet recommendation: Heart Healthy Brief/Interim Summary: Patient is an 71 y.o. male with no significant past medical history who was sent by [name] for the suspicion of stroke and there was also a concern for giant cell arteritis .patient had painless loss of vision at 10 AM reports that patient lost peripheral tunnel vision.  Patient had a dilated eye exam with concern for central retinal artery occlusion,and  was sent to the emergency room for further workup with CT angiogram of head and neck and inflammatory markers giant cell arteritis. Pt states he was having floaters in right eye and today was supposed to have procedure where they remove scar tissue from cataract. When he told them about the vision loss they sent him here for eye exam.  Patient was admitted for further work-up.  Stroke work-up initiated. MRI did not show any evidence of a stroke.  CTA head and neck did not show any large vessel occlusion.  His vision has actually improved this morning.ESR and CRP levels are normal so there is very low suspicion for temporal arteritis, he also denies any headache. PT/OT recommend outpatient follow-up. Blood work showed elevated LDL of 141, started on Crestor. He remains hemodynamically stable and is eager to go home.  Manual blood pressure taken this morning is normal We recommend to follow-up with his PCP and ophthalmologist as an outpatient. I called his [name] for discharge update.  Discharge Diagnoses:  Principal Problem:   Vision loss, right eye Active Problems:   Essential hypertension    Discharge Instructions  Discharge Instructions     Diet - low sodium heart healthy   Complete by: As directed     Discharge instructions   Complete by: As directed    1)Please take prescribed medications as instructed 2)Follow up with your PCP in a week.   3)Follow up with your ophthalmologist as soon as possible 4)We started you on cholesterol pill because your low-density lipoprotein was high.Do a liver function test and repeat lipid panel in 6 weeks.   Increase activity slowly   Complete by: As directed       Allergies as of 06/27/2022   No Known Allergies      Medication List     TAKE these medications    acetaminophen 325 MG tablet Commonly known as: TYLENOL Take 325 mg by mouth daily as needed.   azelastine 0.1 % nasal spray Commonly known as: ASTELIN Place 1 spray into both nostrils daily. As needed   meloxicam 7.5 MG tablet Commonly known as: MOBIC Take 7.5 mg by mouth daily.   rosuvastatin 5 MG tablet Commonly known as: Crestor Take 1 tablet (5 mg total) by mouth daily.        Follow-up Information     Copland, Spencer, MD. Schedule an appointment as soon as possible for a visit in 1 week(s).   Specialties: Family Medicine, Sports Medicine Contact information: [address] [place] [address] [phone]                No Known Allergies  Consultations: None   Procedures/Studies: MR BRAIN WO CONTRAST  Result Date: 06/26/2022 CLINICAL DATA:  Vision loss EXAM: MRI HEAD WITHOUT CONTRAST TECHNIQUE: Multiplanar, multiecho pulse sequences of the brain and surrounding structures were obtained without  intravenous contrast. COMPARISON:  01/21/2007 FINDINGS: Brain: No acute infarct, mass effect or extra-axial collection. No acute or chronic hemorrhage. Minimal multifocal hyperintense T2-weight signal within the white matter. The midline structures are normal. Vascular: Major flow voids are preserved. Skull and upper cervical spine: Normal calvarium and skull base. Visualized upper cervical spine and soft tissues are normal. Sinuses/Orbits:No  paranasal sinus fluid levels or advanced mucosal thickening. No mastoid or middle ear effusion. Normal orbits. IMPRESSION: 1. No acute intracranial abnormality. 2. Minimal multifocal hyperintense T2-weight signal within the white matter, nonspecific but most commonly due to chronic small vessel ischemia. Electronically Signed   By: Ulyses Jarred M.D.   On: 06/26/2022 21:24   CT ANGIO HEAD NECK W WO CM  Result Date: 06/26/2022 CLINICAL DATA:  Stroke/TIA, determine embolic source. Right-sided vision loss. EXAM: CT ANGIOGRAPHY HEAD AND NECK TECHNIQUE: Multidetector CT imaging of the head and neck was performed using the standard protocol during bolus administration of intravenous contrast. Multiplanar CT image reconstructions and MIPs were obtained to evaluate the vascular anatomy. Carotid stenosis measurements (when applicable) are obtained utilizing NASCET criteria, using the distal internal carotid diameter as the denominator. RADIATION DOSE REDUCTION: This exam was performed according to the departmental dose-optimization program which includes automated exposure control, adjustment of the mA and/or kV according to patient size and/or use of iterative reconstruction technique. CONTRAST:  27m OMNIPAQUE IOHEXOL 350 MG/ML SOLN COMPARISON:  Head CT 01/14/2007 and MRI 01/21/2007 FINDINGS: CT HEAD FINDINGS Brain: There is no evidence of an acute infarct, intracranial hemorrhage, mass, midline shift, or extra-axial fluid collection. Mild cerebral atrophy is within normal limits for age. Cerebral white matter hypodensities are nonspecific but compatible with minimal chronic small vessel ischemic disease. Vascular: No hyperdense vessel. Skull: No acute fracture or suspicious osseous lesion. Sinuses/Orbits: Paranasal sinuses and mastoid air cells are clear. Bilateral cataract extraction. Other: None. Review of the MIP images confirms the above findings CTA NECK FINDINGS Aortic arch: Standard 3 vessel aortic arch with  mild atherosclerotic plaque. Widely patent arch vessel origins. Right carotid system: Patent with a small amount calcified plaque at the carotid bifurcation. No evidence of a significant stenosis or dissection. Left carotid system: Patent with a moderate amount of calcified plaque at the carotid bifurcation. No evidence of a significant stenosis or dissection. Vertebral arteries: Patent without evidence of a significant stenosis or dissection. Dominant left vertebral artery. Skeleton: Advanced cervical disc degeneration and asymmetrically advanced right-sided cervical facet arthrosis. Other neck: No evidence of cervical lymphadenopathy or mass. Upper chest: Clear lung apices. Review of the MIP images confirms the above findings CTA HEAD FINDINGS Anterior circulation: The internal carotid arteries are patent from skull base to carotid termini with minimal atherosclerotic plaque bilaterally not resulting in significant stenosis. ACAs and MCAs are patent with asymmetric attenuation of distal right MCA branch vessels but without evidence of a proximal branch occlusion or flow limiting proximal stenosis. No aneurysm is identified. Posterior circulation: The intracranial vertebral arteries are patent to the basilar and small bilaterally. There is a severe stenosis of the proximal right V4 segment. The basilar artery is patent and congenitally small without a significant focal stenosis. There are large posterior communicating arteries and diminutive or absent P1 segments bilaterally. Both PCAs are patent without evidence of a significant proximal stenosis. No aneurysm is identified. Venous sinuses: As permitted by contrast timing, patent. Anatomic variants: Fetal origin of the PCAs. Review of the MIP images confirms the above findings IMPRESSION: 1. No evidence of acute intracranial  abnormality. 2. No large vessel occlusion. 3. Hypoplastic vertebrobasilar circulation. Severe stenosis of the proximal right V4 segment. 4.  Cervical carotid atherosclerosis without significant stenosis. 5.  Aortic Atherosclerosis (ICD10-I70.0). Electronically Signed   By: Logan Bores M.D.   On: 06/26/2022 18:12      Subjective: Patient seen and examined at bedside today.  Hemodynamically stable for discharge  Discharge Exam: Vitals:   06/27/22 0700 06/27/22 0951  BP: (!) 144/95 128/88  Pulse: 94   Resp: 16   Temp: 97.7 F (36.5 C)   SpO2: 96%    Vitals:   06/27/22 0123 06/27/22 0617 06/27/22 0700 06/27/22 0951  BP: 136/78 134/74 (!) 144/95 128/88  Pulse: 81 95 94   Resp: _0 Temp:  98.6 F (37 C) 97.7 F (36.5 C)   TempSrc:  Oral Oral   SpO2: 95% 96% 96%     General: Pt is alert, awake, not in acute distress Cardiovascular: RRR, S1/S2 +, no rubs, no gallops Respiratory: CTA bilaterally, no wheezing, no rhonchi Abdominal: Soft, NT, ND, bowel sounds + Extremities: no edema, no cyanosis    The results of significant diagnostics from this hospitalization (including imaging, microbiology, ancillary and laboratory) are listed below for reference.     Microbiology: No results found for this or any previous visit (from the past 240 hour(s)).   Labs: BNP (last 3 results) No results for input(s): "BNP" in the last 8760 hours. Basic Metabolic Panel: Recent Labs  Lab 06/26/22 1503  NA 142  K 3.6  CL 108  CO2 26  GLUCOSE 96  BUN 22  CREATININE 0.67  CALCIUM 8.8*   Liver Function Tests: No results for input(s): "AST", "ALT", "ALKPHOS", "BILITOT", "PROT", "ALBUMIN" in the last 168 hours. No results for input(s): "LIPASE", "AMYLASE" in the last 168 hours. No results for input(s): "AMMONIA" in the last 168 hours. CBC: Recent Labs  Lab 06/26/22 1503  WBC 6.3  NEUTROABS 4.5  HGB 14.9  HCT 45.3  MCV 90.8  PLT 237   Cardiac Enzymes: No results for input(s): "CKTOTAL", "CKMB", "CKMBINDEX", "TROPONINI" in the last 168 hours. BNP: Invalid input(s): "POCBNP" CBG: No results for input(s):  "GLUCAP" in the last 168 hours. D-Dimer No results for input(s): "DDIMER" in the last 72 hours. Hgb A1c No results for input(s): "HGBA1C" in the last 72 hours. Lipid Profile Recent Labs    06/27/22 0427  CHOL 192  HDL 47  LDLCALC 141*  TRIG 22  CHOLHDL 4.1   Thyroid function studies Recent Labs    06/26/22 1503  TSH 1.310   Anemia work up No results for input(s): "VITAMINB12", "FOLATE", "FERRITIN", "TIBC", "IRON", "RETICCTPCT" in the last 72 hours. Urinalysis No results found for: "COLORURINE", "APPEARANCEUR", "LABSPEC", "PHURINE", "GLUCOSEU", "HGBUR", "BILIRUBINUR", "KETONESUR", "PROTEINUR", "UROBILINOGEN", "NITRITE", "LEUKOCYTESUR" Sepsis Labs Recent Labs  Lab 06/26/22 1503  WBC 6.3   Microbiology No results found for this or any previous visit (from the past 240 hour(s)).  Please note: You were cared for by a hospitalist during your hospital stay. Once you are discharged, your primary care physician will handle any further medical issues. Please note that NO REFILLS for any discharge medications will be authorized once you are discharged, as it is imperative that you return to your primary care physician (or establish a relationship with a primary care physician if you do not have one) for your post hospital discharge needs so that they can reassess your need for medications and monitor your lab values.  Time coordinating discharge: 40 minutes  SIGNED:   Shelly Coss, MD  [facility] 06/27/2022, 10:57 AM Pager [phone]  If 7PM-7AM, please contact night-coverage [email or web address] Password [email or web address]

## 2022-06-28 DIAGNOSIS — H34231 Retinal artery branch occlusion, right eye: Secondary | ICD-10-CM | POA: Diagnosis not present

## 2022-06-28 LAB — ANA W/REFLEX: Anti Nuclear Antibody (ANA): NEGATIVE

## 2022-06-28 LAB — HIGH SENSITIVITY CRP: CRP, High Sensitivity: 5.33 mg/L — ABNORMAL HIGH (ref 0.00–3.00)

## 2022-06-28 LAB — HEMOGLOBIN A1C
Hgb A1c MFr Bld: 5.7 % — ABNORMAL HIGH (ref 4.8–5.6)
Mean Plasma Glucose: 117 mg/dL

## 2022-06-29 ENCOUNTER — Encounter: Payer: Self-pay | Admitting: Family Medicine

## 2022-06-29 ENCOUNTER — Ambulatory Visit (INDEPENDENT_AMBULATORY_CARE_PROVIDER_SITE_OTHER): Payer: Medicare Other | Admitting: Family Medicine

## 2022-06-29 VITALS — BP 130/82 | HR 85 | Temp 98.1°F | Ht 69.5 in | Wt 213.2 lb

## 2022-06-29 DIAGNOSIS — H5461 Unqualified visual loss, right eye, normal vision left eye: Secondary | ICD-10-CM

## 2022-06-29 NOTE — Progress Notes (Signed)
Timothy Davison T. Davis Ambrosini, MD, [place] at [facility] [place], [address]  Phone: [phone]  FAX: [phone]  Timothy Wang - 71 y.o. male  MRN [id number]  Date of Birth: 1950/11/05  Date: 06/29/2022  PCP: Owens Loffler, MD  Referral: Owens Loffler, MD  Chief Complaint  Patient presents with   Follow-up    ER visit 06/26/22 Vision Loss   Subjective:   Timothy Wang is a 71 y.o. very pleasant male patient with Body mass index is 31.04 kg/m. who presents with the following:  Timothy Wang presents to the ER follow-up.  He presented initially to ophthalmology, and then to the ER with some visual changes.  Admit date: 06/26/2022 Discharge date: 06/27/2022  Patient had a painless loss of vision at roughly 10 AM on November 27, and he had some peripheral tunnel vision loss.  He was sent to the emergency room for CT angiogram of the head and neck with concern for possible stroke.  MRI of the brain did not show evidence of stroke.  No blood vessel occlusion on CT angiogram of the head and neck.  At the time of discharge his vision had improved and he had a normal sed rate and CRP.  About a month ago, was seeing some waviness in his R eye.  Then went to the eye doctor.  This week, noticed that his R eye had gone dark.  Went to Dr. Thomasene Ripple, dilated his eye, thought that the optic nerve was swollen.    No real answer for while, but got a lot of steroids, and then the next day, he started to see a lot better.   Has ophthalmology appt next week.    Review of Systems is noted in the HPI, as appropriate  Objective:   BP 130/82   Pulse 85   Temp 98.1 F (36.7 C) (Oral)   Ht 5' 9.5" (1.765 m)   Wt 213 lb 4 oz (96.7 kg)   SpO2 97%   BMI 31.04 kg/m   GEN: No acute distress; alert,appropriate. PULM: Breathing comfortably in no respiratory distress PSYCH: Normally interactive.   Laboratory and Imaging  Data:  Assessment and Plan:     ICD-10-CM   1. Vision loss, right eye  H54.61      I do not have a good explanation why he has lost his sight, and it sounds like no one actually does.  Most of his site has returned in the right eye, but he does have good follow-up with ophthalmology.  I appreciate their insight as to his recovery.  Blood pressure is stable.  Lipids are stable.  Slightly elevated LDL compared to the recent recent ones he has had.  Total cholesterol and HDL are normal. No need for additional medication at this point.  Medication Management during today's office visit: No orders of the defined types were placed in this encounter.  Medications Discontinued During This Encounter  Medication Reason   rosuvastatin (CRESTOR) 5 MG tablet     Orders placed today for conditions managed today: No orders of the defined types were placed in this encounter.   Disposition: No follow-ups on file.  Dr[employer] One speech-to-text software was used for transcription in this dictation.  Possible transcriptional errors can occur using Editor, commissioning.   Signed,  Maud Deed. Coraima Tibbs, MD   Outpatient Encounter Medications as of 06/29/2022  Medication Sig   acetaminophen (TYLENOL) 325 MG tablet Take 325 mg by  mouth daily as needed.   azelastine (ASTELIN) 0.1 % nasal spray Place 1 spray into both nostrils daily. As needed   meloxicam (MOBIC) 7.5 MG tablet Take 7.5 mg by mouth daily.   predniSONE (DELTASONE) 20 MG tablet Take 60 mg by mouth daily.   [DISCONTINUED] rosuvastatin (CRESTOR) 5 MG tablet Take 1 tablet (5 mg total) by mouth daily.   No facility-administered encounter medications on file as of 06/29/2022.

## 2022-07-04 DIAGNOSIS — H34231 Retinal artery branch occlusion, right eye: Secondary | ICD-10-CM | POA: Diagnosis not present

## 2022-07-18 DIAGNOSIS — H34231 Retinal artery branch occlusion, right eye: Secondary | ICD-10-CM | POA: Diagnosis not present

## 2022-08-04 DIAGNOSIS — M1612 Unilateral primary osteoarthritis, left hip: Secondary | ICD-10-CM | POA: Diagnosis not present

## 2022-08-17 DIAGNOSIS — H34231 Retinal artery branch occlusion, right eye: Secondary | ICD-10-CM | POA: Diagnosis not present

## 2022-08-24 DIAGNOSIS — M1612 Unilateral primary osteoarthritis, left hip: Secondary | ICD-10-CM | POA: Diagnosis not present

## 2022-08-24 DIAGNOSIS — M545 Low back pain, unspecified: Secondary | ICD-10-CM | POA: Diagnosis not present

## 2022-08-24 DIAGNOSIS — M25552 Pain in left hip: Secondary | ICD-10-CM | POA: Diagnosis not present

## 2022-08-24 DIAGNOSIS — G8929 Other chronic pain: Secondary | ICD-10-CM | POA: Diagnosis not present

## 2022-08-28 DIAGNOSIS — H26493 Other secondary cataract, bilateral: Secondary | ICD-10-CM | POA: Diagnosis not present

## 2022-08-28 DIAGNOSIS — H34231 Retinal artery branch occlusion, right eye: Secondary | ICD-10-CM | POA: Diagnosis not present

## 2022-08-28 DIAGNOSIS — H3411 Central retinal artery occlusion, right eye: Secondary | ICD-10-CM | POA: Diagnosis not present

## 2022-08-28 DIAGNOSIS — Z961 Presence of intraocular lens: Secondary | ICD-10-CM | POA: Diagnosis not present

## 2022-09-01 DIAGNOSIS — M1612 Unilateral primary osteoarthritis, left hip: Secondary | ICD-10-CM | POA: Diagnosis not present

## 2022-09-01 DIAGNOSIS — M87052 Idiopathic aseptic necrosis of left femur: Secondary | ICD-10-CM | POA: Diagnosis not present

## 2022-09-01 DIAGNOSIS — M25552 Pain in left hip: Secondary | ICD-10-CM | POA: Diagnosis not present

## 2022-09-05 ENCOUNTER — Other Ambulatory Visit: Payer: Self-pay | Admitting: Orthopedic Surgery

## 2022-09-05 DIAGNOSIS — M25552 Pain in left hip: Secondary | ICD-10-CM

## 2022-09-08 DIAGNOSIS — M25552 Pain in left hip: Secondary | ICD-10-CM | POA: Diagnosis not present

## 2022-09-08 DIAGNOSIS — M87052 Idiopathic aseptic necrosis of left femur: Secondary | ICD-10-CM | POA: Diagnosis not present

## 2022-09-19 ENCOUNTER — Ambulatory Visit
Admission: RE | Admit: 2022-09-19 | Discharge: 2022-09-19 | Disposition: A | Discharge status: Home / Self Care | Payer: Medicare Other | Source: Ambulatory Visit | Attending: Orthopedic Surgery | Admitting: Orthopedic Surgery

## 2022-09-19 DIAGNOSIS — M25552 Pain in left hip: Secondary | ICD-10-CM

## 2022-10-16 ENCOUNTER — Other Ambulatory Visit: Payer: Self-pay | Admitting: Orthopedic Surgery

## 2022-10-16 DIAGNOSIS — H34231 Retinal artery branch occlusion, right eye: Secondary | ICD-10-CM | POA: Diagnosis not present

## 2022-10-16 DIAGNOSIS — Z961 Presence of intraocular lens: Secondary | ICD-10-CM | POA: Diagnosis not present

## 2022-10-16 DIAGNOSIS — H353131 Nonexudative age-related macular degeneration, bilateral, early dry stage: Secondary | ICD-10-CM | POA: Diagnosis not present

## 2022-10-16 DIAGNOSIS — H43813 Vitreous degeneration, bilateral: Secondary | ICD-10-CM | POA: Diagnosis not present

## 2022-10-25 DIAGNOSIS — M1612 Unilateral primary osteoarthritis, left hip: Secondary | ICD-10-CM | POA: Diagnosis not present

## 2022-10-27 ENCOUNTER — Encounter
Admission: RE | Admit: 2022-10-27 | Discharge: 2022-10-27 | Disposition: A | Discharge status: Home / Self Care | Payer: Medicare Other | Source: Ambulatory Visit | Attending: Orthopedic Surgery | Admitting: Orthopedic Surgery

## 2022-10-27 DIAGNOSIS — R9431 Abnormal electrocardiogram [ECG] [EKG]: Secondary | ICD-10-CM | POA: Insufficient documentation

## 2022-10-27 DIAGNOSIS — Z01818 Encounter for other preprocedural examination: Secondary | ICD-10-CM | POA: Diagnosis not present

## 2022-10-27 DIAGNOSIS — Z01812 Encounter for preprocedural laboratory examination: Secondary | ICD-10-CM

## 2022-10-27 HISTORY — DX: Cardiac murmur, unspecified: R01.1

## 2022-10-27 LAB — TYPE AND SCREEN
ABO/RH(D): A POS
Antibody Screen: NEGATIVE

## 2022-10-27 LAB — URINALYSIS, ROUTINE W REFLEX MICROSCOPIC
Bilirubin Urine: NEGATIVE
Glucose, UA: NEGATIVE mg/dL
Hgb urine dipstick: NEGATIVE
Ketones, ur: NEGATIVE mg/dL
Leukocytes,Ua: NEGATIVE
Nitrite: NEGATIVE
Protein, ur: NEGATIVE mg/dL
Specific Gravity, Urine: 1.011 (ref 1.005–1.030)
pH: 5 (ref 5.0–8.0)

## 2022-10-27 LAB — COMPREHENSIVE METABOLIC PANEL
ALT: 15 U/L (ref 0–44)
AST: 20 U/L (ref 15–41)
Albumin: 4 g/dL (ref 3.5–5.0)
Alkaline Phosphatase: 134 U/L — ABNORMAL HIGH (ref 38–126)
Anion gap: 7 (ref 5–15)
BUN: 18 mg/dL (ref 8–23)
CO2: 28 mmol/L (ref 22–32)
Calcium: 9.1 mg/dL (ref 8.9–10.3)
Chloride: 104 mmol/L (ref 98–111)
Creatinine, Ser: 0.61 mg/dL (ref 0.61–1.24)
GFR, Estimated: >60 mL/min (ref >60)
Glucose, Bld: 94 mg/dL (ref 70–99)
Potassium: 3.8 mmol/L (ref 3.5–5.1)
Sodium: 139 mmol/L (ref 135–145)
Total Bilirubin: 1 mg/dL (ref 0.3–1.2)
Total Protein: 6.8 g/dL (ref 6.5–8.1)

## 2022-10-27 LAB — CBC WITH DIFFERENTIAL/PLATELET
Abs Immature Granulocytes: 0.02 10*3/uL (ref 0.00–0.07)
Basophils Absolute: 0 10*3/uL (ref 0.0–0.1)
Basophils Relative: 1 %
Eosinophils Absolute: 0 10*3/uL (ref 0.0–0.5)
Eosinophils Relative: 0 %
HCT: 44.5 % (ref 39.0–52.0)
Hemoglobin: 14.5 g/dL (ref 13.0–17.0)
Immature Granulocytes: 0 %
Lymphocytes Relative: 14 %
Lymphs Abs: 0.7 10*3/uL (ref 0.7–4.0)
MCH: 30.6 pg (ref 26.0–34.0)
MCHC: 32.6 g/dL (ref 30.0–36.0)
MCV: 93.9 fL (ref 80.0–100.0)
Monocytes Absolute: 0.5 10*3/uL (ref 0.1–1.0)
Monocytes Relative: 10 %
Neutro Abs: 3.8 10*3/uL (ref 1.7–7.7)
Neutrophils Relative %: 75 %
Platelets: 231 10*3/uL (ref 150–400)
RBC: 4.74 MIL/uL (ref 4.22–5.81)
RDW: 12.9 % (ref 11.5–15.5)
WBC: 5 10*3/uL (ref 4.0–10.5)
nRBC: 0 % (ref 0.0–0.2)

## 2022-10-27 LAB — SURGICAL PCR SCREEN
MRSA, PCR: NEGATIVE
Staphylococcus aureus: NEGATIVE

## 2022-10-27 NOTE — Patient Instructions (Addendum)
Your procedure is scheduled on: Thursday, April 11 Report to the Registration Desk on the 1st floor of the [facility]. To find out your arrival time, please call [phone] between 1PM - 3PM on: Wednesday, April 10 If your arrival time is 6:00 am, do not arrive before that time as the [facility] entrance doors do not open until 6:00 am.  REMEMBER: Instructions that are not followed completely may result in serious medical risk, up to and including death; or upon the discretion of your surgeon and anesthesiologist your surgery may need to be rescheduled.  Do not eat food after midnight the night before surgery.  No gum chewing or hard candies.  You may however, drink CLEAR liquids up to 2 hours before you are scheduled to arrive for your surgery. Do not drink anything within 2 hours of your scheduled arrival time.  Clear liquids include: - water  - apple juice without pulp - gatorade (not RED colors) - black coffee or tea (Do NOT add milk or creamers to the coffee or tea) Do NOT drink anything that is not on this list.  In addition, your doctor has ordered for you to drink the provided:  Ensure Pre-Surgery Clear Carbohydrate Drink  Drinking this carbohydrate drink up to two hours before surgery helps to reduce insulin resistance and improve patient outcomes. Please complete drinking 2 hours before scheduled arrival time.  One week prior to surgery: starting April 4 Stop meloxicam and Anti-inflammatories (NSAIDS) such as Advil, Aleve, Ibuprofen, Motrin, Naproxen, Naprosyn and Aspirin based products such as Excedrin, Goody's Powder, BC Powder. Stop ANY OVER THE COUNTER supplements until after surgery. Stop preservision AREDS You may however, continue to take Tylenol if needed for pain up until the day of surgery.  DO NOT TAKE ANY MEDICATIONS THE MORNING OF SURGERY  No Alcohol for 24 hours before or after surgery.  No Smoking including e-cigarettes for 24 hours before surgery.   No chewable tobacco products for at least 6 hours before surgery.  No nicotine patches on the day of surgery.  Do not use any "recreational" drugs for at least a week (preferably 2 weeks) before your surgery.  Please be advised that the combination of cocaine and anesthesia may have negative outcomes, up to and including death. If you test positive for cocaine, your surgery will be cancelled.  On the morning of surgery brush your teeth with toothpaste and water, you may rinse your mouth with mouthwash if you wish. Do not swallow any toothpaste or mouthwash.  Use CHG Soap as directed on instruction sheet.  Do not wear jewelry.  Do not wear lotions, powders, or perfumes.   Do not shave body hair from the neck down 48 hours before surgery.  Contact lenses, hearing aids and dentures may not be worn into surgery.  Do not bring valuables to the hospital. [facility] is not responsible for any missing/lost belongings or valuables.   Notify your doctor if there is any change in your medical condition (cold, fever, infection).  Wear comfortable clothing (specific to your surgery type) to the hospital.  After surgery, you can help prevent lung complications by doing breathing exercises.  Take deep breaths and cough every 1-2 hours. Your doctor may order a device called an Incentive Spirometer to help you take deep breaths.  If you are being admitted to the hospital overnight, leave your suitcase in the car. After surgery it may be brought to your room.  In case of increased patient  census, it may be necessary for you, the patient, to continue your postoperative care in the Same Day Surgery department.  If you are being discharged the day of surgery, you will not be allowed to drive home. You will need a responsible individual to drive you home and stay with you for 24 hours after surgery.   If you are taking public transportation, you will need to have a responsible individual with  you.  Please call the [facility]. at [phone] if you have any questions about these instructions.  Surgery Visitation Policy:  Patients having surgery or a procedure may have two visitors.  Children under the age of 10 must have an adult with them who is not the patient.  Inpatient Visitation:    Visiting hours are 7 a.m. to 8 p.m. Up to four visitors are allowed at one time in a patient room. The visitors may rotate out with other people during the day.  One visitor age 14 or older may stay with the patient overnight and must be in the room by 8 p.m.  Preoperative Educational Videos for Total Hip, Knee and Shoulder Replacements  To better prepare for surgery, please view our videos that explain the physical activity and discharge planning required to have the best surgical recovery at [facility].  [email or web address]  Questions? Call [phone] or email [email or web address]

## 2022-11-08 MED ORDER — CHLORHEXIDINE GLUCONATE 0.12 % MT SOLN
15.0000 mL | Freq: Once | OROMUCOSAL | Status: AC
  Administered 2022-11-09: 15 mL via OROMUCOSAL

## 2022-11-08 MED ORDER — DEXAMETHASONE SODIUM PHOSPHATE 10 MG/ML IJ SOLN
8.0000 mg | Freq: Once | INTRAMUSCULAR | Status: AC
  Administered 2022-11-09: 10 mg via INTRAVENOUS

## 2022-11-08 MED ORDER — FAMOTIDINE 20 MG PO TABS
20.0000 mg | ORAL_TABLET | Freq: Once | ORAL | Status: AC
  Administered 2022-11-09: 20 mg via ORAL

## 2022-11-08 MED ORDER — ORAL CARE MOUTH RINSE: 15.0000 mL | Freq: Once | OROMUCOSAL | Status: AC

## 2022-11-08 MED ORDER — TRANEXAMIC ACID-NACL 1000-0.7 MG/100ML-% IV SOLN
1000.0000 mg | INTRAVENOUS | Status: AC
  Administered 2022-11-09 (×2): 1000 mg via INTRAVENOUS

## 2022-11-08 MED ORDER — CEFAZOLIN SODIUM-DEXTROSE 2-4 GM/100ML-% IV SOLN
2.0000 g | INTRAVENOUS | Status: AC
  Administered 2022-11-09: 2 g via INTRAVENOUS

## 2022-11-08 MED ORDER — LACTATED RINGERS IV SOLN: INTRAVENOUS | Status: DC

## 2022-11-09 ENCOUNTER — Ambulatory Visit: Payer: Medicare Other | Admitting: Certified Registered"

## 2022-11-09 ENCOUNTER — Encounter: Payer: Self-pay | Admitting: Orthopedic Surgery

## 2022-11-09 ENCOUNTER — Encounter
Admission: RE | Disposition: A | Discharge status: Home / Self Care | Payer: Self-pay | Source: Home / Self Care | Attending: Orthopedic Surgery

## 2022-11-09 ENCOUNTER — Ambulatory Visit: Payer: Medicare Other | Admitting: Urgent Care

## 2022-11-09 ENCOUNTER — Other Ambulatory Visit: Payer: Self-pay

## 2022-11-09 ENCOUNTER — Observation Stay
Admission: RE | Admit: 2022-11-09 | Discharge: 2022-11-10 | Disposition: A | Discharge status: Home / Self Care | Payer: Medicare Other | Attending: Orthopedic Surgery | Admitting: Orthopedic Surgery

## 2022-11-09 ENCOUNTER — Ambulatory Visit: Payer: Medicare Other

## 2022-11-09 DIAGNOSIS — I10 Essential (primary) hypertension: Secondary | ICD-10-CM | POA: Insufficient documentation

## 2022-11-09 DIAGNOSIS — M25552 Pain in left hip: Secondary | ICD-10-CM | POA: Insufficient documentation

## 2022-11-09 DIAGNOSIS — Z79899 Other long term (current) drug therapy: Secondary | ICD-10-CM | POA: Diagnosis not present

## 2022-11-09 DIAGNOSIS — M1612 Unilateral primary osteoarthritis, left hip: Principal | ICD-10-CM | POA: Insufficient documentation

## 2022-11-09 DIAGNOSIS — M87052 Idiopathic aseptic necrosis of left femur: Secondary | ICD-10-CM | POA: Diagnosis not present

## 2022-11-09 DIAGNOSIS — Z471 Aftercare following joint replacement surgery: Secondary | ICD-10-CM | POA: Diagnosis not present

## 2022-11-09 DIAGNOSIS — Z01812 Encounter for preprocedural laboratory examination: Secondary | ICD-10-CM

## 2022-11-09 DIAGNOSIS — Z96642 Presence of left artificial hip joint: Secondary | ICD-10-CM | POA: Diagnosis not present

## 2022-11-09 HISTORY — PX: TOTAL HIP ARTHROPLASTY: SHX124

## 2022-11-09 LAB — ABO/RH: ABO/RH(D): A POS

## 2022-11-09 SURGERY — ARTHROPLASTY, HIP, TOTAL, ANTERIOR APPROACH
Anesthesia: Spinal | Site: Hip | Laterality: Left

## 2022-11-09 MED ORDER — PROPOFOL 1000 MG/100ML IV EMUL
INTRAVENOUS | Status: AC
  Filled 2022-11-09: qty 100

## 2022-11-09 MED ORDER — CEFAZOLIN SODIUM-DEXTROSE 2-4 GM/100ML-% IV SOLN
INTRAVENOUS | Status: AC
  Filled 2022-11-09: qty 100

## 2022-11-09 MED ORDER — HYDROCODONE-ACETAMINOPHEN 5-325 MG PO TABS: 1.0000 | ORAL_TABLET | ORAL | Status: DC | PRN

## 2022-11-09 MED ORDER — CHLORHEXIDINE GLUCONATE 0.12 % MT SOLN
OROMUCOSAL | Status: AC
  Filled 2022-11-09: qty 15

## 2022-11-09 MED ORDER — OXYCODONE HCL 5 MG/5ML PO SOLN: 5.0000 mg | Freq: Once | ORAL | Status: DC | PRN

## 2022-11-09 MED ORDER — ACETAMINOPHEN 10 MG/ML IV SOLN
INTRAVENOUS | Status: AC
  Filled 2022-11-09: qty 100

## 2022-11-09 MED ORDER — LIDOCAINE HCL (PF) 2 % IJ SOLN
INTRAMUSCULAR | Status: AC
  Filled 2022-11-09: qty 5

## 2022-11-09 MED ORDER — ONDANSETRON HCL 4 MG/2ML IJ SOLN
INTRAMUSCULAR | Status: DC | PRN
  Administered 2022-11-09: 4 mg via INTRAVENOUS

## 2022-11-09 MED ORDER — SURGIPHOR WOUND IRRIGATION SYSTEM - OPTIME: TOPICAL | Status: DC | PRN

## 2022-11-09 MED ORDER — BUPIVACAINE HCL (PF) 0.25 % IJ SOLN
INTRAMUSCULAR | Status: AC
  Filled 2022-11-09: qty 60

## 2022-11-09 MED ORDER — KETOROLAC TROMETHAMINE 15 MG/ML IJ SOLN
INTRAMUSCULAR | Status: AC
  Filled 2022-11-09: qty 1

## 2022-11-09 MED ORDER — PHENYLEPHRINE HCL-NACL 20-0.9 MG/250ML-% IV SOLN
INTRAVENOUS | Status: AC
  Filled 2022-11-09: qty 250

## 2022-11-09 MED ORDER — BUPIVACAINE HCL (PF) 0.5 % IJ SOLN
INTRAMUSCULAR | Status: DC | PRN
  Administered 2022-11-09: 2.8 mL via INTRATHECAL

## 2022-11-09 MED ORDER — ACETAMINOPHEN 500 MG PO TABS
ORAL_TABLET | ORAL | Status: AC
  Filled 2022-11-09: qty 2

## 2022-11-09 MED ORDER — BUPIVACAINE HCL (PF) 0.5 % IJ SOLN
INTRAMUSCULAR | Status: AC
  Filled 2022-11-09: qty 10

## 2022-11-09 MED ORDER — ONDANSETRON HCL 4 MG/2ML IJ SOLN: 4.0000 mg | Freq: Four times a day (QID) | INTRAMUSCULAR | Status: DC | PRN

## 2022-11-09 MED ORDER — PANTOPRAZOLE SODIUM 40 MG PO TBEC
40.0000 mg | DELAYED_RELEASE_TABLET | Freq: Every day | ORAL | Status: DC
  Administered 2022-11-09 – 2022-11-10 (×2): 40 mg via ORAL

## 2022-11-09 MED ORDER — PHENYLEPHRINE HCL-NACL 20-0.9 MG/250ML-% IV SOLN
INTRAVENOUS | Status: DC | PRN
  Administered 2022-11-09: 40 ug/min via INTRAVENOUS

## 2022-11-09 MED ORDER — TRAMADOL HCL 50 MG PO TABS: 50.0000 mg | ORAL_TABLET | Freq: Four times a day (QID) | ORAL | Status: DC | PRN

## 2022-11-09 MED ORDER — MENTHOL 3 MG MT LOZG: 1.0000 | LOZENGE | OROMUCOSAL | Status: DC | PRN

## 2022-11-09 MED ORDER — DOCUSATE SODIUM 100 MG PO CAPS
ORAL_CAPSULE | ORAL | Status: AC
  Filled 2022-11-09: qty 1

## 2022-11-09 MED ORDER — ONDANSETRON HCL 4 MG PO TABS: 4.0000 mg | ORAL_TABLET | Freq: Four times a day (QID) | ORAL | Status: DC | PRN

## 2022-11-09 MED ORDER — OXYCODONE HCL 5 MG PO TABS: 5.0000 mg | ORAL_TABLET | Freq: Once | ORAL | Status: DC | PRN

## 2022-11-09 MED ORDER — DEXAMETHASONE SODIUM PHOSPHATE 10 MG/ML IJ SOLN
INTRAMUSCULAR | Status: AC
  Filled 2022-11-09: qty 1

## 2022-11-09 MED ORDER — LIDOCAINE HCL (CARDIAC) PF 100 MG/5ML IV SOSY
PREFILLED_SYRINGE | INTRAVENOUS | Status: DC | PRN
  Administered 2022-11-09: 40 mg via INTRAVENOUS

## 2022-11-09 MED ORDER — KETOROLAC TROMETHAMINE 30 MG/ML IJ SOLN
INTRAMUSCULAR | Status: DC | PRN
  Administered 2022-11-09: 30 mg via INTRAVENOUS

## 2022-11-09 MED ORDER — BUPIVACAINE LIPOSOME 1.3 % IJ SUSP
INTRAMUSCULAR | Status: AC
  Filled 2022-11-09: qty 40

## 2022-11-09 MED ORDER — MORPHINE SULFATE (PF) 4 MG/ML IV SOLN: 0.5000 mg | INTRAVENOUS | Status: DC | PRN

## 2022-11-09 MED ORDER — ACETAMINOPHEN 500 MG PO TABS
1000.0000 mg | ORAL_TABLET | Freq: Three times a day (TID) | ORAL | Status: DC
  Administered 2022-11-09 – 2022-11-10 (×3): 1000 mg via ORAL

## 2022-11-09 MED ORDER — 0.9 % SODIUM CHLORIDE (POUR BTL) OPTIME
TOPICAL | Status: DC | PRN
  Administered 2022-11-09: 500 mL

## 2022-11-09 MED ORDER — SODIUM CHLORIDE 0.9 % IV SOLN: INTRAVENOUS | Status: DC

## 2022-11-09 MED ORDER — PROPOFOL 10 MG/ML IV BOLUS
INTRAVENOUS | Status: DC | PRN
  Administered 2022-11-09: 20 mg via INTRAVENOUS
  Administered 2022-11-09: 50 ug/kg/min via INTRAVENOUS
  Administered 2022-11-09: 30 mg via INTRAVENOUS
  Administered 2022-11-09: 20 mg via INTRAVENOUS

## 2022-11-09 MED ORDER — MIDAZOLAM HCL 2 MG/2ML IJ SOLN
INTRAMUSCULAR | Status: AC
  Filled 2022-11-09: qty 2

## 2022-11-09 MED ORDER — PANTOPRAZOLE SODIUM 40 MG PO TBEC
DELAYED_RELEASE_TABLET | ORAL | Status: AC
  Filled 2022-11-09: qty 1

## 2022-11-09 MED ORDER — SODIUM CHLORIDE FLUSH 0.9 % IV SOLN
INTRAVENOUS | Status: AC
  Filled 2022-11-09: qty 20

## 2022-11-09 MED ORDER — CEFAZOLIN SODIUM-DEXTROSE 2-4 GM/100ML-% IV SOLN
2.0000 g | Freq: Four times a day (QID) | INTRAVENOUS | Status: AC
  Administered 2022-11-09 (×2): 2 g via INTRAVENOUS

## 2022-11-09 MED ORDER — ONDANSETRON HCL 4 MG/2ML IJ SOLN
INTRAMUSCULAR | Status: AC
  Filled 2022-11-09: qty 2

## 2022-11-09 MED ORDER — EPINEPHRINE PF 1 MG/ML IJ SOLN
INTRAMUSCULAR | Status: AC
  Filled 2022-11-09: qty 2

## 2022-11-09 MED ORDER — SURGIFLO WITH THROMBIN (HEMOSTATIC MATRIX KIT) OPTIME
TOPICAL | Status: DC | PRN
  Administered 2022-11-09: 1 via TOPICAL

## 2022-11-09 MED ORDER — SODIUM CHLORIDE 0.9 % IR SOLN
Status: DC | PRN
  Administered 2022-11-09: 100 mL

## 2022-11-09 MED ORDER — ACETAMINOPHEN 10 MG/ML IV SOLN
INTRAVENOUS | Status: DC | PRN
  Administered 2022-11-09: 1000 mg via INTRAVENOUS

## 2022-11-09 MED ORDER — SODIUM CHLORIDE (PF) 0.9 % IJ SOLN
INTRAMUSCULAR | Status: DC | PRN
  Administered 2022-11-09: 50 mL via INTRAMUSCULAR

## 2022-11-09 MED ORDER — TRANEXAMIC ACID-NACL 1000-0.7 MG/100ML-% IV SOLN
INTRAVENOUS | Status: AC
  Filled 2022-11-09: qty 100

## 2022-11-09 MED ORDER — MIDAZOLAM HCL 5 MG/5ML IJ SOLN
INTRAMUSCULAR | Status: DC | PRN
  Administered 2022-11-09: 2 mg via INTRAVENOUS

## 2022-11-09 MED ORDER — FAMOTIDINE 20 MG PO TABS
ORAL_TABLET | ORAL | Status: AC
  Filled 2022-11-09: qty 1

## 2022-11-09 MED ORDER — METOCLOPRAMIDE HCL 5 MG/ML IJ SOLN: 5.0000 mg | Freq: Three times a day (TID) | INTRAMUSCULAR | Status: DC | PRN

## 2022-11-09 MED ORDER — METOCLOPRAMIDE HCL 5 MG PO TABS: 5.0000 mg | ORAL_TABLET | Freq: Three times a day (TID) | ORAL | Status: DC | PRN

## 2022-11-09 MED ORDER — FENTANYL CITRATE (PF) 100 MCG/2ML IJ SOLN: 25.0000 ug | INTRAMUSCULAR | Status: DC | PRN

## 2022-11-09 MED ORDER — ENOXAPARIN SODIUM 40 MG/0.4ML IJ SOSY
40.0000 mg | PREFILLED_SYRINGE | INTRAMUSCULAR | Status: DC
  Administered 2022-11-10: 40 mg via SUBCUTANEOUS

## 2022-11-09 MED ORDER — KETOROLAC TROMETHAMINE 30 MG/ML IJ SOLN
INTRAMUSCULAR | Status: AC
  Filled 2022-11-09: qty 1

## 2022-11-09 MED ORDER — PHENYLEPHRINE HCL (PRESSORS) 10 MG/ML IV SOLN
INTRAVENOUS | Status: DC | PRN
  Administered 2022-11-09: 80 ug via INTRAVENOUS

## 2022-11-09 MED ORDER — KETOROLAC TROMETHAMINE 15 MG/ML IJ SOLN
15.0000 mg | Freq: Four times a day (QID) | INTRAMUSCULAR | Status: AC
  Administered 2022-11-09 – 2022-11-10 (×4): 15 mg via INTRAVENOUS

## 2022-11-09 MED ORDER — PHENOL 1.4 % MT LIQD: 1.0000 | OROMUCOSAL | Status: DC | PRN

## 2022-11-09 MED ORDER — DOCUSATE SODIUM 100 MG PO CAPS
100.0000 mg | ORAL_CAPSULE | Freq: Two times a day (BID) | ORAL | Status: DC
  Administered 2022-11-09 – 2022-11-10 (×3): 100 mg via ORAL

## 2022-11-09 SURGICAL SUPPLY — 75 items
ADH SKN CLS APL DERMABOND .7 (GAUZE/BANDAGES/DRESSINGS) ×1
AGENT HMST KT MTR STRL THRMB (HEMOSTASIS) ×1
APL PRP STRL LF DISP 70% ISPRP (MISCELLANEOUS) ×1
BLADE CLIPPER SURG (BLADE) IMPLANT
BLADE SAGITTAL AGGR TOOTH XLG (BLADE) ×1 IMPLANT
BNDG CMPR 5X6 CHSV STRCH STRL (GAUZE/BANDAGES/DRESSINGS) ×2
BNDG COHESIVE 6X5 TAN ST LF (GAUZE/BANDAGES/DRESSINGS) ×1 IMPLANT
CHLORAPREP W/TINT 26 (MISCELLANEOUS) ×1 IMPLANT
COVER BACK TABLE REUSABLE LG (DRAPES) ×1 IMPLANT
DERMABOND ADVANCED .7 DNX12 (GAUZE/BANDAGES/DRESSINGS) ×1 IMPLANT
DRAPE 3/4 80X56 (DRAPES) ×1 IMPLANT
DRAPE C-ARM XRAY 36X54 (DRAPES) ×1 IMPLANT
DRAPE POUCH INSTRU U-SHP 10X18 (DRAPES) ×1 IMPLANT
DRSG MEPILEX SACRM 8.7X9.8 (GAUZE/BANDAGES/DRESSINGS) ×1 IMPLANT
DRSG OPSITE POSTOP 4X8 (GAUZE/BANDAGES/DRESSINGS) ×1 IMPLANT
ELECT BLADE 4.0 EZ CLEAN MEGAD (MISCELLANEOUS) ×1
ELECT REM PT RETURN 9FT ADLT (ELECTROSURGICAL) ×1
ELECTRODE BLDE 4.0 EZ CLN MEGD (MISCELLANEOUS) ×1 IMPLANT
ELECTRODE REM PT RTRN 9FT ADLT (ELECTROSURGICAL) ×1 IMPLANT
GLOVE BIO SURGEON STRL SZ8 (GLOVE) ×1 IMPLANT
GLOVE BIOGEL PI IND STRL 8 (GLOVE) ×1 IMPLANT
GLOVE PI ORTHO PRO STRL 7.5 (GLOVE) ×2 IMPLANT
GLOVE PI ORTHO PRO STRL SZ8 (GLOVE) ×2 IMPLANT
GLOVE SURG SYN 7.5  E (GLOVE) ×1
GLOVE SURG SYN 7.5 E (GLOVE) ×1 IMPLANT
GLOVE SURG SYN 7.5 PF PI (GLOVE) ×1 IMPLANT
GOWN SRG XL LVL 3 NONREINFORCE (GOWNS) ×1 IMPLANT
GOWN STRL NON-REIN TWL XL LVL3 (GOWNS) ×1
GOWN STRL REUS W/ TWL LRG LVL3 (GOWN DISPOSABLE) ×1 IMPLANT
GOWN STRL REUS W/ TWL XL LVL3 (GOWN DISPOSABLE) ×1 IMPLANT
GOWN STRL REUS W/TWL LRG LVL3 (GOWN DISPOSABLE) ×1
GOWN STRL REUS W/TWL XL LVL3 (GOWN DISPOSABLE) ×1
HANDLE YANKAUER SUCT OPEN TIP (MISCELLANEOUS) ×1 IMPLANT
HEAD BIOLOX HIP 28/+4 (Joint) IMPLANT
HIP BIOLOX HD 28/+4 (Joint) ×1 IMPLANT
HOLDER FOLEY CATH W/STRAP (MISCELLANEOUS) ×1 IMPLANT
HOOD PEEL AWAY T7 (MISCELLANEOUS) ×2 IMPLANT
IV NS 100ML SINGLE PACK (IV SOLUTION) ×1 IMPLANT
KIT PATIENT CARE HANA TABLE (KITS) ×1 IMPLANT
LIGHT WAVEGUIDE WIDE FLAT (MISCELLANEOUS) ×1 IMPLANT
LINER 42MM E (Orthopedic Implant) IMPLANT
LINER ADM MDM INS 28/48 42E (Liner) IMPLANT
MANIFOLD NEPTUNE II (INSTRUMENTS) ×1 IMPLANT
MARKER SKIN DUAL TIP RULER LAB (MISCELLANEOUS) ×1 IMPLANT
MAT ABSORB  FLUID 56X50 GRAY (MISCELLANEOUS) ×1
MAT ABSORB FLUID 56X50 GRAY (MISCELLANEOUS) ×1 IMPLANT
NDL SPNL 20GX3.5 QUINCKE YW (NEEDLE) ×1 IMPLANT
NEEDLE SPNL 20GX3.5 QUINCKE YW (NEEDLE) ×1 IMPLANT
NS IRRIG 500ML POUR BTL (IV SOLUTION) ×1 IMPLANT
PACK HIP COMPR (MISCELLANEOUS) ×1 IMPLANT
SCREW HEX LP 6.5X20 (Screw) IMPLANT
SCREW HEX LP 6.5X25 (Screw) IMPLANT
SCREW HEX LP 6.5X35 (Screw) IMPLANT
SHELL ACETAB 54 E CLUSTER HOLE (Shell) IMPLANT
SLEEVE SCD COMPRESS KNEE MED (STOCKING) ×1 IMPLANT
SOLUTION IRRIG SURGIPHOR (IV SOLUTION) ×1 IMPLANT
STEM HIGH OFFSET SZ6 38.5X107 (Stem) IMPLANT
SURGIFLO W/THROMBIN 8M KIT (HEMOSTASIS) IMPLANT
SUT BONE WAX W31G (SUTURE) ×1 IMPLANT
SUT DVC 2 QUILL PDO  T11 36X36 (SUTURE) ×1
SUT DVC 2 QUILL PDO T11 36X36 (SUTURE) ×1 IMPLANT
SUT ETHIBOND 2 V 37 (SUTURE) ×1 IMPLANT
SUT QUILL MONODERM 3-0 PS-2 (SUTURE) ×1 IMPLANT
SUT SILK 0 (SUTURE) ×1
SUT SILK 0 30XBRD TIE 6 (SUTURE) ×1 IMPLANT
SUT VIC AB 0 CT1 36 (SUTURE) ×1 IMPLANT
SUT VIC AB 2-0 CT2 27 (SUTURE) ×2 IMPLANT
SYR 10ML LL (SYRINGE) ×1 IMPLANT
SYR 30ML LL (SYRINGE) ×2 IMPLANT
TAPE MICROFOAM 4IN (TAPE) IMPLANT
TOWEL OR 17X26 4PK STRL BLUE (TOWEL DISPOSABLE) IMPLANT
TRAP FLUID SMOKE EVACUATOR (MISCELLANEOUS) ×1 IMPLANT
TRAY FOLEY SLVR 16FR LF STAT (SET/KITS/TRAYS/PACK) ×1 IMPLANT
WAND WEREWOLF FASTSEAL 6.0 (MISCELLANEOUS) ×1 IMPLANT
WATER STERILE IRR 1000ML POUR (IV SOLUTION) ×1 IMPLANT

## 2022-11-09 NOTE — Transfer of Care (Signed)
Immediate Anesthesia Transfer of Care Note  Patient: Timothy Wang  Procedure(s) Performed: TOTAL HIP ARTHROPLASTY ANTERIOR APPROACH (Left: Hip)  Patient Location: PACU  Anesthesia Type:Spinal  Level of Consciousness: awake, alert , and oriented  Airway & Oxygen Therapy: Patient Spontanous Breathing  Post-op Assessment: Report given to RN and Post -op Vital signs reviewed and stable  Post vital signs: Reviewed and stable  Last Vitals:  Vitals Value Taken Time  BP 101/90 11/09/22 1025  Temp 35.9 1026  Pulse 89 11/09/22 1029  Resp 24 11/09/22 1029  SpO2 96 % 11/09/22 1029  Vitals shown include unvalidated device data.  Last Pain:  Vitals:   11/09/22 0644  TempSrc: Oral         Complications: No notable events documented.

## 2022-11-09 NOTE — H&P (Signed)
Chief Complaint  Patient presents with  Pre-op Exam  Scheduled for Left direct anterior THA 11/09/22   Timothy Wang is a 72 y.o. male who presents today for history and physical for left anterior total hip arthroplasty with Dr. Audelia Acton on 11/09/2022. Patient has had years of increasing left hip pain with x-rays showing severe degenerative changes of the left hip joint along with MRI showing severe arthritic changes with severe cartilage loss along the acetabulum and femoral head with significant dysplasia of the femoral head. Patient has been walking with a cane. His activities daily living has significantly been limited. He has been taking Tylenol and meloxicam with some relief. Initially received relief with intra-articular injections but most recently they provide him with no relief. Pain interferes with his quality of life. Patient is seen Dr. Audelia Acton, discussed left anterior total hip arthroplasty and agreed and consented the procedure. Patient is a non-smoker, his BMI is 31.3, and his last hemoglobin A1c was 5.7  Past Medical History: Past Medical History:  Diagnosis Date  Depression   Past Surgical History: Past Surgical History:  Procedure Laterality Date  Repair of the left ruptured quadriceps tendon Left 05/15/14   Past Family History: No family history on file.  Medications: Current Outpatient Medications Ordered in Epic  Medication Sig Dispense Refill  acetaminophen (TYLENOL) 325 MG tablet Take 325 mg by mouth daily as needed.  azelastine (ASTELIN) 137 mcg nasal spray Place 1 spray into both nostrils daily. As needed  fluticasone (FLONASE) 50 mcg/actuation nasal spray  meloxicam (MOBIC) 7.5 MG tablet TAKE 1 TABLET BY MOUTH EVERY DAY 30 tablet 1  simvastatin (ZOCOR) 40 MG tablet Take 40 mg by mouth once daily.   No current Epic-ordered facility-administered medications on file.   Allergies: No Known Allergies   Review of Systems:  A comprehensive 14 point ROS was  performed, reviewed by me today, and the pertinent orthopaedic findings are documented in the HPI.  Exam: BP 138/88  Ht 182.9 cm (6')  Wt 94.6 kg (208 lb 9.6 oz)  BMI 28.29 kg/m  General:  Well developed, well nourished, no apparent distress, normal affect, normal gait with no antalgic component.   HEENT: Head normocephalic, atraumatic, PERRL.   Abdomen: Soft, non tender, non distended, Bowel sounds present.  Heart: Examination of the heart reveals regular, rate, and rhythm. There is no murmur noted on ascultation. There is a normal apical pulse.  Lungs: Lungs are clear to auscultation. There is no wheeze, rhonchi, or crackles. There is normal expansion of bilateral chest walls.   Left hip exam  SKIN: intact SWELLING: none WARMTH: no warmth TENDERNESS: none, Stinchfield Positive ROM: 0 degrees internal rotation and 20 degrees external rotation and pain with internal rotation,; Hip Flexion 80 STRENGTH: normal except for weakness in left hip flexion secondary to apin GAIT: Trendelenburg STABILITY: stable to testing CREPITUS: yes LEG LENGTH DISCREPANCY: right longer by 1.5 cm NEUROLOGICAL EXAM: normal VASCULAR EXAM: normal LUMBAR SPINE: tenderness: no straight leg raising sign: no motor exam: normal  The contralateral hip was examined for comparison and it showed: TENDERNESS: none ROM: normal and full STRENGTH: normal STABILITY: stable to testing  Hip Imaging : AP pelvis and lateral hip X-rays (2 views) taken today in the office of the left hip which reveal severe degenerative changes complete collapse of the superior femoral head with medial bone-on-bone articulation and lateral subluxation of the hip. When compared to images from 1 year ago this shows a rapid progression of collapse and  deformity of the femoral head consistent with avascular necrosis. The right hip is noted to have mild degenerative changes with some medial joint space narrowing and superior acetabular  sclerosis.   EXAM:  MR OF THE LEFT HIP WITHOUT CONTRAST   TECHNIQUE:  Multiplanar, multisequence MR imaging was performed. No intravenous  contrast was administered.   COMPARISON: None Available.   FINDINGS:  Bones:   No hip fracture, dislocation or avascular necrosis. Relatively  shallow acetabulum with flattened aspherical left femoral head as  can be seen with developmental dysplasia of the hip.   No periosteal reaction or bone destruction. No aggressive osseous  lesion.   Mild osteoarthritis of bilateral SI joints.   Severe degenerative disease with disc height loss at L4-5 and L5-S1  with bilateral facet arthropathy.   Articular cartilage and labrum   Articular cartilage: Extensive full-thickness cartilage loss of the  left femoral head and acetabulum with subchondral reactive marrow  edema, subchondral cystic changes and marginal osteophytes. Mild  partial-thickness cartilage loss of the left femoral head and  acetabulum along the medial aspect.   Labrum: Maceration of the left labrum. Right superior labral tear  with a tiny paralabral cyst.   Joint or bursal effusion   Joint effusion: No right hip joint effusion. Moderate left hip joint  effusion. No SI joint effusion.   Bursae: No bursal fluid.   Muscles and tendons   Flexors: Normal.   Extensors: Normal.   Abductors: Normal.   Adductors: Normal.   Gluteals: Normal.   Hamstrings: Severe tendinosis of the right hamstring origin with a  partial-thickness tear.   Other findings   No pelvic free fluid. No fluid collection or hematoma. No inguinal  lymphadenopathy. No inguinal hernia.   IMPRESSION:  1. Severe osteoarthritis of the left hip with likely underlying  developmental dysplasia of the hip.  2. Severe tendinosis of the right hamstring origin with a  partial-thickness tear.  3. Severe degenerative disease with disc height loss at L4-5 and  L5-S1 with bilateral facet arthropathy.    Electronically Signed  By: Elige KoHetal Patel M.D.  On: 09/20/2022 06:46   Impression: Primary osteoarthritis of left hip [M16.12] Primary osteoarthritis of left hip (primary encounter diagnosis)  Plan:  811. 72 year old male with advanced left hip degenerative arthritis with dysplasia of the femoral head presents to the office for history and physical for left anterior total hip arthroplasty with Dr. Audelia ActonAberman on 11/09/2022. Pain interferes with his quality of life and activities of daily living. He has had no relief with conservative treatment measures consisting of medications, intra-articular injections. He has had MRI and is well as negative workup for infection. Risks, benefits, complications of a left anterior total hip arthroplasty and discussed with the patient. Patient agreed and consented procedure with Dr. Audelia ActonAberman on 11/09/2022.  The hospitalization and post-operative care and rehabilitation were also discussed. The use of perioperative antibiotics and DVT prophylaxis were discussed. The risk, benefits and alternatives to a surgical intervention were discussed at length with the patient. The patient was also advised of risks related to the medical comorbidities and elevated body mass index (BMI). A lengthy discussion took place to review the most common complications including but not limited to: deep vein thrombosis, pulmonary embolus, heart attack, stroke, infection, wound breakdown, heterotopic ossification, dislocation, numbness, leg length in-equality, intraoperative fracture, damage to nerves, tendon,muscles, arteries or other blood vessels, death and other possible complications from anesthesia. The patient was told that we will take steps to minimize these  risks by using sterile technique, antibiotics and DVT prophylaxis when appropriate and follow the patient postoperatively in the office setting to monitor progress. The possibility of recurrent pain, no improvement in pain and actual  worsening of pain were also discussed with the patient. The risk of dislocation following total hip replacement was discussed and potential precautions to prevent dislocation were reviewed.   This note was generated in part with voice recognition software and I apologize for any typographical errors that were not detected and corrected.

## 2022-11-09 NOTE — Op Note (Signed)
Patient Name: Timothy Wang  RJP:[id number]  Pre-Operative Diagnosis: Left hip osteoarthritis  Post-Operative Diagnosis: (same)  Procedure: Left Total Hip Arthroplasty  Components/Implants: Cup: Trident Tritanium Clusterhole 66mm w/ x2 screws    Liner: MDM 42/E   Stem: Insignia #6 high offset  Head:3mm +62mm inner ball biolox w/ ADM/MDM 28/48 (42E)  Date of Surgery: 11/09/2022  Surgeon: Reinaldo Berber MD  Assistant: Amador Cunas PA (present and scrubbed throughout the case, critical for assistance with exposure, retraction, instrumentation, and closure)   Anesthesiologist: Joelene Millin  Anesthesia: Spinal  EBL: 400cc  IVF:650cc  Complications: None   Brief history: The patient is a 72 year old male with a history of osteoarthritis of the left hip with pain limiting their range of motion and activities of daily living, which has failed multiple attempts at conservative therapy.  The risks and benefits of total hip arthroplasty as definitive surgical treatment were discussed with the patient, who opted to proceed with the operation.  After outpatient medical clearance and optimization was completed the patient was admitted to [facility] for the procedure.  All preoperative films were reviewed and an appropriate surgical plan was made prior to surgery.   Description of procedure: The patient was brought to the operating room where laterality was confirmed by all those present to be the left side.  The patient was administered spinal anesthesia on a stretcher prior to being moved supine on the operating room table. Patient was given an intravenous dose of antibiotics for surgical prophylaxis and TXA.  All bony prominences and extremities were well padded and the patient was securely attached to the table boots, a perineal post was placed and the patient had a safety strap placed.  Surgical site was prepped with alcohol and chlorhexidine. The surgical site over the hip was  and draped in typical sterile fashion with multiple layers of adhesive and nonadhesive drapes.  The incision site was marked out with a sterile marker and care was taken to assess the position of the ASIS and ensure appropriate position for the incision.    A surgical timeout was then called with participation of all staff in the room the patient was then a confirmed again and laterality confirmed.  Incision was made over the anterior lateral aspect of the proximal thigh in line with the TFL.  Appropriate retractors were placed and all bleeding vessels were coagulated within the subcutaneous and fatty layers.  An incision was made in the TFL fascia in the interval was carefully identified.  The lateral ascending branches of the circumflex vessels were identified, cauterized and carefully dissected. The main vessels were then tied with a 0 silk hand tie.  Retractors were placed around the superior lateral and inferior medial aspects of the femoral neck and a capsulotomy was performed exposing the hip joint.  Retraction stitches were placed and the capsulotomy to assist with visualization.  Femoral neck cut was then made and the femoral head was extracted after placing the leg in traction.  Bone wax was then applied to the proximal cut surface of the femur and aqua mantis was used to address any bleeding around the femoral neck cut.  Retractors were then placed around the acetabulum to fully visualize the joint space, and the remaining labral tissue was removed and pulvinar was removed.   The acetabulum was then sequentially reamed up to the appropriate size in order to get good fit and fill for the acetabular component while under fluoroscopic guidance.  Acetabular component was then placed  and malleted into a secure fit while confirming position and abduction angle and anteversion utilizing fluoroscopy.  2 screws were then placed in the acetabular cup to assist in securing the cup in place.  The cup was then  irrigated and a real MDM liner was impacted and checked for stability. The femur traction was dropped and sequentially externally rotated while performing a release of the posterior and superomedial tissues off of the proximal femur to allow for mobility, care was taken to preserve the external rotators and piriformis attachments.  The remaining interval between the abductors and the capsule was dissected out and a retractor was placed over the superolateral aspect of the femur over the greater trochanter.  The leg was carefully brought down into extension and adducted to provide visualization of the proximal femur for broaching.  The femur was then sequentially broached up to an appropriate size which provided for good fill and stability to the femoral broach.  A trial neck and head were placed on the femoral broach and the leg was brought up for reduction.  The hip was reduced and manual check of stability was performed with the boot detached from the table.  The hip was found to be stable in flexion internal rotation and extension external rotation.  Leg lengths were confirmed on fluoroscopy.   The hip was then dislocated the trial neck and head were removed.  The trial acetabular liner was removed.  The hip was then irrigated with normal saline and the final poly liner was implanted.  The leg was then brought down into extension and adduction in the proximal femur was reexposed.  The broach trial was removed and the femur was irrigated with normal saline prior to the real femoral stem being implanted.  After the femoral stem was seated and shown to have good fit and fill the appropriate head was impacted the leg was brought up and reduced.  There was good range of motion with stability in flexion internal rotation and extension external rotation on testing.  Leg lengths were found to be appropriate on fluoroscopic evaluation at this time.  The hip was then irrigated with betdine based surgiphor solution and  then saline solution.  The capsulotomy was repaired with Ethibond sutures.  A pericapsular and peritrochanteric cocktail with Exparel and bupivacaine was then injected as well as the subcutaneous tissues. The fascia was closed with a #2 barbed running suture.  The deep tissues were closed with Vicryl sutures the subcutaneous tissues were closed with interrupted Vicryl sutures and a running barbed 3-0 suture.  The skin was then reinforced with Dermabond and a sterile dressing was placed.   The patient was awoken from anesthesia transferred off of the operating room table onto a hospital bed where examination of leg lengths found the leg lengths to be equal with a good distal pulse.  The patient was then transferred to the PACU in stable condition.

## 2022-11-09 NOTE — Progress Notes (Signed)
Patient is not able to walk the distance required to go the bathroom, or he/she is unable to safely negotiate stairs required to access the bathroom.  A 3in1 BSC will alleviate this problem  

## 2022-11-09 NOTE — Evaluation (Signed)
Physical Therapy Evaluation Patient Details Name: Timothy Wang MRN: [id number] DOB: [birth year] Today's Date: 11/09/2022  History of Present Illness  Patient is s/p L THA, anterior approach. WBAT  Clinical Impression  Patient received in bed, sister present in room. Patient is agreeable to PT assessment. He is mod I with bed mobility and min guard for sit to stand from low bed. Patient educated on WBAT and walker use. He is able to ambulate 150 feet with RW and min guard. Reports occasional lateral L hip pain with mobility. Patient will continue to benefit from skilled PT to improve functional independence and safety with mobility.        Recommendations for follow up therapy are one component of a multi-disciplinary discharge planning process, led by the attending physician.  Recommendations may be updated based on patient status, additional functional criteria and insurance authorization.  Follow Up Recommendations       Assistance Recommended at Discharge Intermittent Supervision/Assistance  Patient can return home with the following  A little help with walking and/or transfers;A little help with bathing/dressing/bathroom;Assist for transportation;Help with stairs or ramp for entrance    Equipment Recommendations Rolling walker (2 wheels)  Recommendations for Other Services       Functional Status Assessment Patient has had a recent decline in their functional status and demonstrates the ability to make significant improvements in function in a reasonable and predictable amount of time.     Precautions / Restrictions Precautions Precautions: Fall Restrictions Weight Bearing Restrictions: Yes LLE Weight Bearing: Weight bearing as tolerated      Mobility  Bed Mobility Overal bed mobility: Modified Independent             General bed mobility comments: increased time, no assistance needed    Transfers Overall transfer level: Needs assistance Equipment used: Rolling  walker (2 wheels) Transfers: Sit to/from Stand Sit to Stand: Min guard                Ambulation/Gait Ambulation/Gait assistance: Min guard Gait Distance (Feet): 150 Feet Assistive device: Rolling walker (2 wheels) Gait Pattern/deviations: Step-through pattern Gait velocity: decr     General Gait Details: patient demonstrates smooth ambulation using RW, reciprocal gait pattern with very little pain this pm. Requires cues to not do too much day one.  Stairs            Wheelchair Mobility    Modified Rankin (Stroke Patients Only)       Balance Overall balance assessment: Needs assistance Sitting-balance support: Feet supported Sitting balance-Leahy Scale: Normal     Standing balance support: Bilateral upper extremity supported, During functional activity, Reliant on assistive device for balance Standing balance-Leahy Scale: Good                               Pertinent Vitals/Pain Pain Assessment Pain Assessment: Faces Faces Pain Scale: Hurts a little bit Pain Location: lateral left hip with walking. Not constant Pain Descriptors / Indicators: Discomfort Pain Intervention(s): Ice applied    Home Living Family/patient expects to be discharged to:: Private residence Living Arrangements: Alone Available Help at Discharge: Family;Available 24 hours/day Type of Home: House Home Access: Stairs to enter Entrance Stairs-Rails: Can reach both Entrance Stairs-Number of Steps: 2   Home Layout: One level Home Equipment: Cane - single point Additional Comments: Patient states his home is cluttered right now and he may have difficulty getting a walker around in it. He is  planning to go to hotel with his sister initially after discharge and then can go to a friend's home if needed.    Prior Function Prior Level of Function : Independent/Modified Independent;Driving             Mobility Comments: Pt independent with household and community level  activity participation. Recently pt began to use SPC 2/2 to intermittent hip and back pain. ADLs Comments: Independent with cooking, driving, eating, IADLs.     Hand Dominance   Dominant Hand: Right    Extremity/Trunk Assessment   Upper Extremity Assessment Upper Extremity Assessment: Overall WFL for tasks assessed    Lower Extremity Assessment Lower Extremity Assessment: Overall WFL for tasks assessed    Cervical / Trunk Assessment Cervical / Trunk Assessment: Normal  Communication   Communication: HOH  Cognition Arousal/Alertness: Awake/alert Behavior During Therapy: WFL for tasks assessed/performed Overall Cognitive Status: Within Functional Limits for tasks assessed                                          General Comments      Exercises Total Joint Exercises Ankle Circles/Pumps: AROM, Both, 15 reps   Assessment/Plan    PT Assessment Patient needs continued PT services  PT Problem List Decreased strength;Decreased activity tolerance;Decreased balance;Decreased mobility;Decreased skin integrity;Decreased knowledge of use of DME;Pain       PT Treatment Interventions DME instruction;Therapeutic exercise;Gait training;Balance training;Stair training;Functional mobility training;Therapeutic activities;Patient/family education    PT Goals (Current goals can be found in the Care Plan section)  Acute Rehab PT Goals Patient Stated Goal: to leave tomorrow, improve pain from prior to sx. PT Goal Formulation: With patient Time For Goal Achievement: 11/16/22 Potential to Achieve Goals: Good    Frequency BID     Co-evaluation               AM-PAC PT "6 Clicks" Mobility  Outcome Measure Help needed turning from your back to your side while in a flat bed without using bedrails?: None Help needed moving from lying on your back to sitting on the side of a flat bed without using bedrails?: None Help needed moving to and from a bed to a chair  (including a wheelchair)?: A Little Help needed standing up from a chair using your arms (e.g., wheelchair or bedside chair)?: A Little Help needed to walk in hospital room?: A Little Help needed climbing 3-5 steps with a railing? : A Little 6 Click Score: 20    End of Session Equipment Utilized During Treatment: Gait belt Activity Tolerance: Patient tolerated treatment well Patient left: in chair;with call bell/phone within reach;with family/visitor present Nurse Communication: Mobility status PT Visit Diagnosis: Other abnormalities of gait and mobility (R26.89);Muscle weakness (generalized) (M62.81);Difficulty in walking, not elsewhere classified (R26.2);Pain Pain - Right/Left: Left Pain - part of body: Hip    Time: 7121-9758 PT Time Calculation (min) (ACUTE ONLY): 28 min   Charges:   PT Evaluation $PT Eval Moderate Complexity: 1 Mod PT Treatments $Gait Training: 8-22 mins        Smita Lesh, PT, GCS 11/09/22,1:52 PM

## 2022-11-09 NOTE — Plan of Care (Signed)
  Problem: Activity: Goal: Ability to avoid complications of mobility impairment will improve Outcome: Progressing Goal: Ability to tolerate increased activity will improve Outcome: Progressing   Problem: Pain Management: Goal: Pain level will decrease with appropriate interventions Outcome: Progressing   Problem: Skin Integrity: Goal: Will show signs of wound healing Outcome: Progressing   

## 2022-11-09 NOTE — Anesthesia Procedure Notes (Signed)
Spinal  Patient location during procedure: OR Start time: 11/09/2022 7:31 AM End time: 11/09/2022 7:40 AM Reason for block: surgical anesthesia Staffing Performed: resident/CRNA  Resident/CRNA: Morene Crocker, CRNA Performed by: Morene Crocker, CRNA Authorized by: Louie Boston, MD   Preanesthetic Checklist Completed: patient identified, IV checked, site marked, risks and benefits discussed, surgical consent, monitors and equipment checked, pre-op evaluation and timeout performed Spinal Block Patient position: sitting Prep: ChloraPrep Patient monitoring: heart rate, continuous pulse ox and blood pressure Approach: midline Location: L3-4 Injection technique: single-shot Needle Needle type: Introducer and Pencan  Needle gauge: 24 G Needle length: 9 cm Assessment Sensory level: T6 Events: CSF return Additional Notes Negative paresthesia. Negative blood return. Positive free-flowing CSF. Expiration date of kit checked and confirmed. Patient tolerated procedure well, without complications. Successful on attempt x1, no complications noted. Pt. Tolerated procedure well without any pain/discomfort.

## 2022-11-09 NOTE — Interval H&P Note (Signed)
Patient history and physical updated. Consent reviewed including risks, benefits, and alternatives to surgery. We had a specific conversation about the increased risk of nerve injury given his significant leg shortening. Patient agrees with above plan to proceed with left anterior total hip.

## 2022-11-09 NOTE — Anesthesia Preprocedure Evaluation (Signed)
Anesthesia Evaluation  Patient identified by MRN, date of birth, ID band Patient awake    Reviewed: Allergy & Precautions, NPO status , Patient's Chart, lab work & pertinent test results  History of Anesthesia Complications Negative for: history of anesthetic complications  Airway Mallampati: III  TM Distance: >3 FB Neck ROM: full    Dental no notable dental hx.    Pulmonary neg pulmonary ROS, former smoker   Pulmonary exam normal        Cardiovascular Exercise Tolerance: Good Normal cardiovascular exam  ECHO IMPRESSIONS     1. Left ventricular ejection fraction, by estimation, is 60 to 65%. The  left ventricle has normal function. The left ventricle has no regional  wall motion abnormalities. Left ventricular diastolic parameters are  consistent with Grade I diastolic  dysfunction (impaired relaxation).   2. Right ventricular systolic function is normal. The right ventricular  size is normal. Tricuspid regurgitation signal is inadequate for assessing  PA pressure.   3. The mitral valve is normal in structure. No evidence of mitral valve  regurgitation. No evidence of mitral stenosis.   4. The aortic valve is normal in structure. There is moderate  calcification of the aortic valve. Aortic valve regurgitation is not  visualized. Aortic valve sclerosis/calcification is present, without any  evidence of aortic stenosis. Aortic valve area,  by VTI measures 2.86 cm. Aortic valve mean gradient measures 9.5 mmHg.   5. The inferior vena cava is normal in size with greater than 50%  respiratory variability, suggesting right atrial pressure of 3 mmHg.      Neuro/Psych  PSYCHIATRIC DISORDERS  Depression    negative neurological ROS     GI/Hepatic Neg liver ROS,GERD  ,,  Endo/Other  negative endocrine ROS    Renal/GU      Musculoskeletal   Abdominal   Peds  Hematology   Anesthesia Other Findings Past Medical  History: No date: Allergic rhinitis due to pollen 2020: Cataract     Comment:  bilateral No date: Depression No date: GERD (gastroesophageal reflux disease) No date: H/O: Bell's palsy No date: Heart murmur No date: Hyperlipidemia No date: Hypertension No date: Ventral hernia  Past Surgical History: 04/20/2020: CATARACT EXTRACTION W/PHACO; Right     Comment:  Procedure: CATARACT EXTRACTION PHACO AND INTRAOCULAR               LENS PLACEMENT (IOC) RIGHT 11.14 00:53.9;  Surgeon:               Galen Manila, MD;  Location: [facility];                Service: Ophthalmology;  Laterality: Right; 05/11/2020: CATARACT EXTRACTION W/PHACO; Left     Comment:  Procedure: CATARACT EXTRACTION PHACO AND INTRAOCULAR               LENS PLACEMENT (IOC) LEFT 4.98 00:28.8 ;  Surgeon:               Galen Manila, MD;  Location: [facility];                Service: Ophthalmology;  Laterality: Left; No date: COLONOSCOPY 05/04/2022: COLONOSCOPY WITH PROPOFOL; N/A     Comment:  Procedure: COLONOSCOPY WITH BIOPSY;  Surgeon: Midge Minium, MD;  Location: [facility];  Service:               Endoscopy;  Laterality:  N/A; 1987: HEEL SPUR SURGERY; Right 2015: KNEE SURGERY; Left     Comment:  tendon repair 05/04/2022: POLYPECTOMY; N/A     Comment:  Procedure: POLYPECTOMY;  Surgeon: Midge Minium, MD;                Location: [facility];  Service: Endoscopy;                Laterality: N/A; 1960: TONSILLECTOMY 11/03/2016: VENTRAL HERNIA REPAIR; N/A     Comment:  Procedure: HERNIA REPAIR VENTRAL ADULT WITH MESH;                Surgeon: Ricarda Frame, MD;  Location: [facility];                Service: General;  Laterality: N/A;  BMI    Body Mass Index: 30.42 kg/m      Reproductive/Obstetrics negative OB ROS                             Anesthesia Physical Anesthesia Plan  ASA: 2  Anesthesia Plan: Spinal   Post-op Pain  Management: Toradol IV (intra-op)* and Ofirmev IV (intra-op)*   Induction: Intravenous  PONV Risk Score and Plan: 1 and Propofol infusion, TIVA and Treatment may vary due to age or medical condition  Airway Management Planned: Natural Airway and Nasal Cannula  Additional Equipment:   Intra-op Plan:   Post-operative Plan:   Informed Consent: I have reviewed the patients History and Physical, chart, labs and discussed the procedure including the risks, benefits and alternatives for the proposed anesthesia with the patient or authorized representative who has indicated his/her understanding and acceptance.     Dental Advisory Given  Plan Discussed with: Anesthesiologist, CRNA and Surgeon  Anesthesia Plan Comments: (Patient reports no bleeding problems and no anticoagulant use.  Plan for spinal with backup GA  Patient consented for risks of anesthesia including but not limited to:  - adverse reactions to medications - damage to eyes, teeth, lips or other oral mucosa - nerve damage due to positioning  - risk of bleeding, infection and or nerve damage from spinal that could lead to paralysis - risk of headache or failed spinal - damage to teeth, lips or other oral mucosa - sore throat or hoarseness - damage to heart, brain, nerves, lungs, other parts of body or loss of life  Patient voiced understanding.)        Anesthesia Quick Evaluation

## 2022-11-09 NOTE — Discharge Instructions (Addendum)
Instructions after Anterior Total Hip Replacement        Dr. Zachary Aberman, Jr., M.D.      Dept. of Orthopaedics & Sports Medicine  [facility]  [address]  [place], [place]  [address]  Phone: [phone]   Fax: [phone]    DIET: Drink plenty of non-alcoholic fluids. Resume your normal diet. Include foods high in fiber.  ACTIVITY:  You may use crutches or a walker with weight-bearing as tolerated, unless instructed otherwise. You may be weaned off of the walker or crutches by your Physical Therapist.  Continue doing gentle exercises. Exercising will reduce the pain and swelling, increase motion, and prevent muscle weakness.   Please continue to use the TED compression stockings for 2 weeks. You may remove the stockings at night, but should reapply them in the morning. Do not drive or operate any equipment until instructed.  WOUND CARE:  Continue to use ice packs periodically to reduce pain and swelling. You may shower with honeycomb dressing 3 days after your surgery. Do not submerge incision site under water. Remove honeycomb dressing 7 days after surgery and allow dermabond to fall off on its own.   MEDICATIONS: You may resume your regular medications. Please take the pain medication as prescribed on the medication list. Do not take pain medication on an empty stomach. You have been given a prescription for a blood thinner to prevent blood clots. Please take the medication as instructed. (NOTE: After completing a 2 week course of Lovenox, take one Enteric-coated 81 mg aspirin twice a day for 3 additional weeks.) Pain medications and iron supplements can cause constipation. Use a stool softener (Senokot or Colace) on a daily basis and a laxative (dulcolax or miralax) as needed. Do not drive or drink alcoholic beverages when taking pain medications.  POSTOPERATIVE CONSTIPATION PROTOCOL Constipation - defined medically as fewer than three stools per week and  severe constipation as less than one stool per week.  One of the most common issues patients have following surgery is constipation.  Even if you have a regular bowel pattern at home, your normal regimen is likely to be disrupted due to multiple reasons following surgery.  Combination of anesthesia, postoperative narcotics, change in appetite and fluid intake all can affect your bowels.  In order to avoid complications following surgery, here are some recommendations in order to help you during your recovery period.  Colace (docusate) - Pick up an over-the-counter form of Colace or another stool softener and take twice a day as long as you are requiring postoperative pain medications.  Take with a full glass of water daily.  If you experience loose stools or diarrhea, hold the colace until you stool forms back up.  If your symptoms do not get better within 1 week or if they get worse, check with your doctor.  Dulcolax (bisacodyl) - Pick up over-the-counter and take as directed by the product packaging as needed to assist with the movement of your bowels.  Take with a full glass of water.  Use this product as needed if not relieved by Colace only.   MiraLax (polyethylene glycol) - Pick up over-the-counter to have on hand.  MiraLax is a solution that will increase the amount of water in your bowels to assist with bowel movements.  Take as directed and can mix with a glass of water, juice, soda, coffee, or tea.  Take if you go more than two days without a movement. Do not use MiraLax more than   once per day. Call your doctor if you are still constipated or irregular after using this medication for 7 days in a row.  If you continue to have problems with postoperative constipation, please contact the office for further assistance and recommendations.  If you experience "the worst abdominal pain ever" or develop nausea or vomiting, please contact the office immediatly for further recommendations for  treatment.   CALL THE OFFICE FOR: Temperature above 101 degrees Excessive bleeding or drainage on the dressing. Excessive swelling, coldness, or paleness of the toes. Persistent nausea and vomiting.  FOLLOW-UP:  You should have an appointment to return to the office in 2 weeks after surgery. Arrangements have been made for continuation of Physical Therapy (either home therapy or outpatient therapy).  

## 2022-11-09 NOTE — Discharge Summary (Signed)
Physician Discharge Summary  Patient ID: Timothy Wang MRN: [id number] DOB/AGE: 04-20-1951 72 y.o.  Admit date: 11/09/2022 Discharge date: 11/10/2022  Admission Diagnoses:  Osteoarthritis of left hip [M16.12]   Discharge Diagnoses: Patient Active Problem List   Diagnosis Date Noted   Osteoarthritis of left hip 11/09/2022   Vision loss, right eye 06/26/2022   Polyp of ascending colon    Essential hypertension 08/04/2010   Hyperlipidemia 05/28/2008   Depression, major, in remission (HCC) 04/21/2008   ALLERGIC RHINITIS 04/21/2008    Past Medical History:  Diagnosis Date   Allergic rhinitis due to pollen    Cataract 2020   bilateral   Depression    GERD (gastroesophageal reflux disease)    H/O: Bell's palsy    Heart murmur    Hyperlipidemia    Hypertension    Ventral hernia      Transfusion: none   Consultants (if any):   Discharged Condition: Improved  Hospital Course: Timothy RetortSteven Malter is an 72 y.o. male who was admitted 11/09/2022 with a diagnosis of Osteoarthritis of left hip and went to the operating room on 11/09/2022 and underwent the above named procedures.    Surgeries: Procedure(s): TOTAL HIP ARTHROPLASTY ANTERIOR APPROACH on 11/09/2022 Patient tolerated the surgery well. Taken to PACU where she was stabilized and then transferred to the orthopedic floor.  Started on Lovenox 40 mg q 24 hrs. TEDs and SCDs applied bilaterally. Heels elevated on bed. No evidence of DVT. Negative Homan. Physical therapy started on day #1 for gait training and transfer. OT started day #1 for ADL and assisted devices. Patient's IV was d/c on day #1. Patient was able to safely and independently complete all PT goals. PT recommending discharge to home. On post op day #1 patient was stable and ready for discharge to home with HHPT.  Implants: Cup: Trident Tritanium Clusterhole 54mm w/ x2 screws    Liner: MDM 42/E   Stem: Insignia #6 high offset  Head:1528mm +464mm inner ball biolox w/ ADM/MDM  28/48 (42E)    He was given perioperative antibiotics:  Anti-infectives (From admission, onward)    Start     Dose/Rate Route Frequency Ordered Stop   11/09/22 1330  ceFAZolin (ANCEF) IVPB 2g/100 mL premix        2 g 200 mL/hr over 30 Minutes Intravenous Every 6 hours 11/09/22 1125 11/09/22 1926   11/09/22 0600  ceFAZolin (ANCEF) IVPB 2g/100 mL premix        2 g 200 mL/hr over 30 Minutes Intravenous On call to O.R. 11/08/22 2149 11/09/22 0800     .  He was given sequential compression devices, early ambulation, and Lovenox TEDs for DVT prophylaxis.  He benefited maximally from the hospital stay and there were no complications.    Recent vital signs:  Vitals:   11/10/22 0242 11/10/22 0706  BP: 111/76 114/74  Pulse: 89 85  Resp: 16 16  Temp: 97.9 F (36.6 C) (!) 97.5 F (36.4 C)  SpO2: 94% 94%    Recent laboratory studies:  Lab Results  Component Value Date   HGB 11.4 (L) 11/10/2022   HGB 14.5 10/27/2022   HGB 14.9 06/26/2022   Lab Results  Component Value Date   WBC 15.4 (H) 11/10/2022   PLT 172 11/10/2022   Lab Results  Component Value Date   INR 1.3 (H) 06/26/2022   Lab Results  Component Value Date   NA 139 11/10/2022   K 3.8 11/10/2022   CL 107 11/10/2022   CO2  26 11/10/2022   BUN 13 11/10/2022   CREATININE 0.67 11/10/2022   GLUCOSE 124 (H) 11/10/2022    Discharge Medications:   Allergies as of 11/10/2022   No Known Allergies      Medication List     STOP taking these medications    meloxicam 7.5 MG tablet Commonly known as: MOBIC       TAKE these medications    acetaminophen 500 MG tablet Commonly known as: TYLENOL Take 2 tablets (1,000 mg total) by mouth every 8 (eight) hours. What changed:  medication strength how much to take when to take this reasons to take this   azelastine 0.1 % nasal spray Commonly known as: ASTELIN Place 1 spray into both nostrils daily as needed.   celecoxib 200 MG capsule Commonly known as:  CeleBREX Take 1 capsule (200 mg total) by mouth 2 (two) times daily for 14 days.   docusate sodium 100 MG capsule Commonly known as: COLACE Take 1 capsule (100 mg total) by mouth 2 (two) times daily.   enoxaparin 40 MG/0.4ML injection Commonly known as: LOVENOX Inject 0.4 mLs (40 mg total) into the skin daily for 14 days.   ondansetron 4 MG tablet Commonly known as: ZOFRAN Take 1 tablet (4 mg total) by mouth every 6 (six) hours as needed for nausea.   oxyCODONE 5 MG immediate release tablet Commonly known as: Roxicodone Take 0.5-1 tablets (2.5-5 mg total) by mouth every 8 (eight) hours as needed for breakthrough pain.   PreserVision AREDS 2 Caps Take 2 capsules by mouth daily.   traMADol 50 MG tablet Commonly known as: ULTRAM Take 1 tablet (50 mg total) by mouth every 6 (six) hours as needed for moderate pain.               Durable Medical Equipment  (From admission, onward)           Start     Ordered   11/09/22 1410  For home use only DME Walker rolling  Once       Question Answer Comment  Walker: With 5 Inch Wheels   Patient needs a walker to treat with the following condition Impaired mobility      11/09/22 1410   11/09/22 1410  For home use only DME Bedside commode  Once       Question:  Patient needs a bedside commode to treat with the following condition  Answer:  Impaired mobility   11/09/22 1410            Diagnostic Studies: DG HIP UNILAT WITH PELVIS 1V LEFT  Result Date: 11/09/2022 CLINICAL DATA:  [id number] Surgery, elective [id number] EXAM: DG HIP (WITH OR WITHOUT PELVIS) 1V*L* COMPARISON:  Left hip MRI 09/19/2022. FINDINGS: Intraoperative images during left total hip arthroplasty. Normal alignment. No evidence of immediate hardware complication. IMPRESSION: Intraoperative images during left total hip arthroplasty. Normal alignment. No evidence of immediate hardware complication. Electronically Signed   By: Caprice Renshaw M.D.   On: 11/09/2022 10:25    DG C-Arm 1-60 Min-No Report  Result Date: 11/09/2022 Fluoroscopy was utilized by the requesting physician.  No radiographic interpretation.   DG C-Arm 1-60 Min-No Report  Result Date: 11/09/2022 Fluoroscopy was utilized by the requesting physician.  No radiographic interpretation.   DG C-Arm 1-60 Min-No Report  Result Date: 11/09/2022 Fluoroscopy was utilized by the requesting physician.  No radiographic interpretation.    Disposition:      Follow-up Information     Evon Slack,  PA-C Follow up in 2 week(s).   Specialties: Orthopedic Surgery, Emergency Medicine Contact information: [address] [place] [address] [phone]                  Signed: Patience Musca 11/10/2022, 8:28 AM

## 2022-11-10 ENCOUNTER — Encounter: Payer: Self-pay | Admitting: Orthopedic Surgery

## 2022-11-10 DIAGNOSIS — I10 Essential (primary) hypertension: Secondary | ICD-10-CM | POA: Diagnosis not present

## 2022-11-10 DIAGNOSIS — M1612 Unilateral primary osteoarthritis, left hip: Secondary | ICD-10-CM | POA: Diagnosis not present

## 2022-11-10 DIAGNOSIS — M25552 Pain in left hip: Secondary | ICD-10-CM | POA: Diagnosis not present

## 2022-11-10 DIAGNOSIS — M87052 Idiopathic aseptic necrosis of left femur: Secondary | ICD-10-CM | POA: Diagnosis not present

## 2022-11-10 DIAGNOSIS — Z79899 Other long term (current) drug therapy: Secondary | ICD-10-CM | POA: Diagnosis not present

## 2022-11-10 LAB — CBC
HCT: 34 % — ABNORMAL LOW (ref 39.0–52.0)
Hemoglobin: 11.4 g/dL — ABNORMAL LOW (ref 13.0–17.0)
MCH: 30.9 pg (ref 26.0–34.0)
MCHC: 33.5 g/dL (ref 30.0–36.0)
MCV: 92.1 fL (ref 80.0–100.0)
Platelets: 172 10*3/uL (ref 150–400)
RBC: 3.69 MIL/uL — ABNORMAL LOW (ref 4.22–5.81)
RDW: 13.1 % (ref 11.5–15.5)
WBC: 15.4 10*3/uL — ABNORMAL HIGH (ref 4.0–10.5)
nRBC: 0 % (ref 0.0–0.2)

## 2022-11-10 LAB — BASIC METABOLIC PANEL
Anion gap: 6 (ref 5–15)
BUN: 13 mg/dL (ref 8–23)
CO2: 26 mmol/L (ref 22–32)
Calcium: 8.3 mg/dL — ABNORMAL LOW (ref 8.9–10.3)
Chloride: 107 mmol/L (ref 98–111)
Creatinine, Ser: 0.67 mg/dL (ref 0.61–1.24)
GFR, Estimated: >60 mL/min (ref >60)
Glucose, Bld: 124 mg/dL — ABNORMAL HIGH (ref 70–99)
Potassium: 3.8 mmol/L (ref 3.5–5.1)
Sodium: 139 mmol/L (ref 135–145)

## 2022-11-10 MED ORDER — ACETAMINOPHEN 500 MG PO TABS
ORAL_TABLET | ORAL | Status: AC
  Filled 2022-11-10: qty 2

## 2022-11-10 MED ORDER — OXYCODONE HCL 5 MG PO TABS: 2.5000 mg | ORAL_TABLET | Freq: Three times a day (TID) | ORAL | 0 refills | Status: DC | PRN

## 2022-11-10 MED ORDER — ENOXAPARIN SODIUM 40 MG/0.4ML IJ SOSY
PREFILLED_SYRINGE | INTRAMUSCULAR | Status: AC
  Filled 2022-11-10: qty 0.4

## 2022-11-10 MED ORDER — ONDANSETRON HCL 4 MG PO TABS: 4.0000 mg | ORAL_TABLET | Freq: Four times a day (QID) | ORAL | 0 refills | Status: DC | PRN

## 2022-11-10 MED ORDER — KETOROLAC TROMETHAMINE 15 MG/ML IJ SOLN
INTRAMUSCULAR | Status: AC
  Filled 2022-11-10: qty 1

## 2022-11-10 MED ORDER — ACETAMINOPHEN 500 MG PO TABS: 1000.0000 mg | ORAL_TABLET | Freq: Three times a day (TID) | ORAL | 0 refills | Status: DC

## 2022-11-10 MED ORDER — TRAMADOL HCL 50 MG PO TABS: 50.0000 mg | ORAL_TABLET | Freq: Four times a day (QID) | ORAL | 0 refills | Status: DC | PRN

## 2022-11-10 MED ORDER — CELECOXIB 200 MG PO CAPS: 200.0000 mg | ORAL_CAPSULE | Freq: Two times a day (BID) | ORAL | 0 refills | Status: AC

## 2022-11-10 MED ORDER — DOCUSATE SODIUM 100 MG PO CAPS
ORAL_CAPSULE | ORAL | Status: AC
  Filled 2022-11-10: qty 1

## 2022-11-10 MED ORDER — DOCUSATE SODIUM 100 MG PO CAPS: 100.0000 mg | ORAL_CAPSULE | Freq: Two times a day (BID) | ORAL | 0 refills | Status: DC

## 2022-11-10 MED ORDER — ENOXAPARIN SODIUM 40 MG/0.4ML IJ SOSY: 40.0000 mg | PREFILLED_SYRINGE | INTRAMUSCULAR | 0 refills | Status: DC

## 2022-11-10 MED ORDER — PANTOPRAZOLE SODIUM 40 MG PO TBEC
DELAYED_RELEASE_TABLET | ORAL | Status: AC
  Filled 2022-11-10: qty 1

## 2022-11-10 NOTE — Progress Notes (Signed)
Patient has met all discharger requirements safely and successfully. Has been in minimal pain. Lovenox teaching performed and understanding achieved. Equipment ordered and has been delivered to room. Instructions given to patient and patient sister. All questions answered to satisfaction.

## 2022-11-10 NOTE — Progress Notes (Signed)
Patient is not able to walk the distance required to go the bathroom, or he/she is unable to safely negotiate stairs required to access the bathroom.  A 3in1 BSC will alleviate this problem  

## 2022-11-10 NOTE — Anesthesia Postprocedure Evaluation (Signed)
Anesthesia Post Note  Patient: Timothy Wang  Procedure(s) Performed: TOTAL HIP ARTHROPLASTY ANTERIOR APPROACH (Left: Hip)  Patient location during evaluation: Nursing Unit Anesthesia Type: Spinal Level of consciousness: oriented and awake and alert Pain management: pain level controlled Vital Signs Assessment: post-procedure vital signs reviewed and stable Respiratory status: spontaneous breathing and respiratory function stable Cardiovascular status: blood pressure returned to baseline and stable Postop Assessment: no headache, no backache, no apparent nausea or vomiting and patient able to bend at knees Anesthetic complications: no   No notable events documented.   Last Vitals:  Vitals:   11/10/22 0242 11/10/22 0706  BP: 111/76 114/74  Pulse: 89 85  Resp: 16 16  Temp: 36.6 C (!) 36.4 C  SpO2: 94% 94%    Last Pain:  Vitals:   11/10/22 0706  TempSrc:   PainSc: 1                  Rosanne Gutting

## 2022-11-10 NOTE — Progress Notes (Addendum)
   Subjective: 1 Day Post-Op Procedure(s) (LRB): TOTAL HIP ARTHROPLASTY ANTERIOR APPROACH (Left) Patient reports pain as mild.   Patient is well, and has had no acute complaints or problems Denies any CP, SOB, ABD pain. We will continue therapy today.  Plan is to go Home after hospital stay.  Objective: Vital signs in last 24 hours: Temp:  [97.4 F (36.3 C)-97.9 F (36.6 C)] 97.5 F (36.4 C) (04/12 0706) Pulse Rate:  [76-95] 85 (04/12 0706) Resp:  [10-20] 16 (04/12 0706) BP: (101-132)/(70-90) 114/74 (04/12 0706) SpO2:  [94 %-99 %] 94 % (04/12 0706)  Intake/Output from previous day: 04/11 0701 - 04/12 0700 In: 1780 [P.O.:480; I.V.:900; IV Piggyback:400] Out: 2050 [Urine:1650; Blood:400] Intake/Output this shift: No intake/output data recorded.  Recent Labs    11/10/22 0629  HGB 11.4*   Recent Labs    11/10/22 0629  WBC 15.4*  RBC 3.69*  HCT 34.0*  PLT 172   Recent Labs    11/10/22 0629  NA 139  K 3.8  CL 107  CO2 26  BUN 13  CREATININE 0.67  GLUCOSE 124*  CALCIUM 8.3*   No results for input(s): "LABPT", "INR" in the last 72 hours.  EXAM General - Patient is Alert, Appropriate, and Oriented Extremity - Neurovascular intact Sensation intact distally Intact pulses distally Dorsiflexion/Plantar flexion intact No cellulitis present Compartment soft Dressing - dressing C/D/I and no drainage Motor Function - intact, moving foot and toes well on exam.   Past Medical History:  Diagnosis Date   Allergic rhinitis due to pollen    Cataract 2020   bilateral   Depression    GERD (gastroesophageal reflux disease)    H/O: Bell's palsy    Heart murmur    Hyperlipidemia    Hypertension    Ventral hernia     Assessment/Plan:   1 Day Post-Op Procedure(s) (LRB): TOTAL HIP ARTHROPLASTY ANTERIOR APPROACH (Left) Principal Problem:   Osteoarthritis of left hip  Estimated body mass index is 30.42 kg/m as calculated from the following:   Height as of this  encounter: 5' 9.5" (1.765 m).   Weight as of this encounter: 94.8 kg. Advance diet Up with therapy Pain well controlled Labs and vital signs normal CM to assist with discharge to home with HHPT today   DVT Prophylaxis - Lovenox, TED hose, and SCDs Weight-Bearing as tolerated to left leg   T. Cranston Neighbor, PA-C [facility] 11/10/2022, 8:25 AM  Patient seen and examined, agree with above plan.  The patient is doing well status post left anterior total hip arthroplasty, no concerns at this time.  Pain is controlled.  Discussed DVT prophylaxis, pain medication use, and safe transition to home.  All questions answered the patient agrees with above plan will go home after clears PT.   Reinaldo Berber MD

## 2022-11-10 NOTE — Progress Notes (Signed)
Physical Therapy Treatment Patient Details Name: Timothy Wang MRN: [id number] DOB: 1950/11/12 Today's Date: 11/10/2022   History of Present Illness Patient is s/p L THA, anterior approach. WBAT    PT Comments    Pt was long sitting in bed upon arrival. He is A and O x 4. Endorses no pain but does state " Its just a little sore."  Pt was easily and safely able to exit bed, stand to RW and ambulate > 200 ft total. He performed ascending/descending stairs 2 x. 1 x with +1 UE support on rail and 1 x without using railings + using RW. Overall pt demonstrates safe functional mobility. Cleared from an acute PT standpoint for safe DC home. Recommend continued skilled PT to maximize independence and safety with all ADLs.    Recommendations for follow up therapy are one component of a multi-disciplinary discharge planning process, led by the attending physician.  Recommendations may be updated based on patient status, additional functional criteria and insurance authorization.     Assistance Recommended at Discharge Intermittent Supervision/Assistance  Patient can return home with the following A little help with walking and/or transfers;A little help with bathing/dressing/bathroom;Assist for transportation;Help with stairs or ramp for entrance   Equipment Recommendations  Rolling walker (2 wheels);BSC/3in1       Precautions / Restrictions Precautions Precautions: Fall Restrictions Weight Bearing Restrictions: Yes LLE Weight Bearing: Weight bearing as tolerated     Mobility  Bed Mobility Overal bed mobility: Modified Independent   Transfers Overall transfer level: Needs assistance Equipment used: Rolling walker (2 wheels) Transfers: Sit to/from Stand Sit to Stand: Supervision  General transfer comment: no physical assistance however did require Vcs for improved technique    Ambulation/Gait Ambulation/Gait assistance: Supervision Gait Distance (Feet): 200 Feet Assistive device:  Rolling walker (2 wheels) Gait Pattern/deviations: Step-through pattern, Antalgic Gait velocity: WNL  General Gait Details: Pt demonstrates safe steady gait kinematics   Stairs Stairs: Yes Stairs assistance: Supervision Stair Management: One rail Right, Step to pattern, Forwards, No rails, With walker, Backwards Number of Stairs: 4 General stair comments: Pt performed stairs 2 x. 1 x with +1 UE support on rail, then 1 x without use of rails with using RW only. pt demonstarted safe performance both ways      Balance Overall balance assessment: Needs assistance Sitting-balance support: Feet supported Sitting balance-Leahy Scale: Normal     Standing balance support: Bilateral upper extremity supported, During functional activity, Reliant on assistive device for balance Standing balance-Leahy Scale: Good       Cognition Arousal/Alertness: Awake/alert Behavior During Therapy: WFL for tasks assessed/performed Overall Cognitive Status: Within Functional Limits for tasks assessed      General Comments: Pt is A and O x 4           General Comments General comments (skin integrity, edema, etc.): Reviewed car transfers, HEP, and importance of routine mobility. Surgeon arrived during session and answered all of pt's question. Cleared for DC home      Pertinent Vitals/Pain Pain Assessment Pain Assessment: No/denies pain Faces Pain Scale: No hurt Pain Location: lateral left hip with walking. Not constant Pain Descriptors / Indicators: Sore Pain Intervention(s): Limited activity within patient's tolerance, Monitored during session, Premedicated before session, Repositioned, Ice applied     PT Goals (current goals can now be found in the care plan section) Acute Rehab PT Goals Patient Stated Goal: get better Progress towards PT goals: Progressing toward goals    Frequency    BID  PT Plan Current plan remains appropriate       AM-PAC PT "6 Clicks" Mobility    Outcome Measure  Help needed turning from your back to your side while in a flat bed without using bedrails?: None Help needed moving from lying on your back to sitting on the side of a flat bed without using bedrails?: None Help needed moving to and from a bed to a chair (including a wheelchair)?: None Help needed standing up from a chair using your arms (e.g., wheelchair or bedside chair)?: None Help needed to walk in hospital room?: None Help needed climbing 3-5 steps with a railing? : A Little 6 Click Score: 23    End of Session   Activity Tolerance: Patient tolerated treatment well Patient left: in chair;with call bell/phone within reach;with family/visitor present Nurse Communication: Mobility status PT Visit Diagnosis: Other abnormalities of gait and mobility (R26.89);Muscle weakness (generalized) (M62.81);Difficulty in walking, not elsewhere classified (R26.2);Pain Pain - Right/Left: Left Pain - part of body: Hip     Time: 1478-2956 PT Time Calculation (min) (ACUTE ONLY): 26 min  Charges:  $Gait Training: 8-22 mins $Therapeutic Activity: 8-22 mins                     Jetta Lout PTA 11/10/22, 10:35 AM

## 2022-11-13 DIAGNOSIS — H269 Unspecified cataract: Secondary | ICD-10-CM | POA: Diagnosis not present

## 2022-11-13 DIAGNOSIS — G510 Bell's palsy: Secondary | ICD-10-CM | POA: Diagnosis not present

## 2022-11-13 DIAGNOSIS — Z791 Long term (current) use of non-steroidal anti-inflammatories (NSAID): Secondary | ICD-10-CM | POA: Diagnosis not present

## 2022-11-13 DIAGNOSIS — K219 Gastro-esophageal reflux disease without esophagitis: Secondary | ICD-10-CM | POA: Diagnosis not present

## 2022-11-13 DIAGNOSIS — I10 Essential (primary) hypertension: Secondary | ICD-10-CM | POA: Diagnosis not present

## 2022-11-13 DIAGNOSIS — Z471 Aftercare following joint replacement surgery: Secondary | ICD-10-CM | POA: Diagnosis not present

## 2022-11-13 DIAGNOSIS — H548 Legal blindness, as defined in USA: Secondary | ICD-10-CM | POA: Diagnosis not present

## 2022-11-13 DIAGNOSIS — Z604 Social exclusion and rejection: Secondary | ICD-10-CM | POA: Diagnosis not present

## 2022-11-13 DIAGNOSIS — J309 Allergic rhinitis, unspecified: Secondary | ICD-10-CM | POA: Diagnosis not present

## 2022-11-13 DIAGNOSIS — Z86010 Personal history of colonic polyps: Secondary | ICD-10-CM | POA: Diagnosis not present

## 2022-11-13 DIAGNOSIS — E785 Hyperlipidemia, unspecified: Secondary | ICD-10-CM | POA: Diagnosis not present

## 2022-11-13 DIAGNOSIS — Z96642 Presence of left artificial hip joint: Secondary | ICD-10-CM | POA: Diagnosis not present

## 2022-11-13 LAB — SURGICAL PATHOLOGY

## 2022-11-17 DIAGNOSIS — K219 Gastro-esophageal reflux disease without esophagitis: Secondary | ICD-10-CM | POA: Diagnosis not present

## 2022-11-17 DIAGNOSIS — J309 Allergic rhinitis, unspecified: Secondary | ICD-10-CM | POA: Diagnosis not present

## 2022-11-17 DIAGNOSIS — I10 Essential (primary) hypertension: Secondary | ICD-10-CM | POA: Diagnosis not present

## 2022-11-17 DIAGNOSIS — Z471 Aftercare following joint replacement surgery: Secondary | ICD-10-CM | POA: Diagnosis not present

## 2022-11-17 DIAGNOSIS — H269 Unspecified cataract: Secondary | ICD-10-CM | POA: Diagnosis not present

## 2022-11-17 DIAGNOSIS — E785 Hyperlipidemia, unspecified: Secondary | ICD-10-CM | POA: Diagnosis not present

## 2022-11-20 DIAGNOSIS — K219 Gastro-esophageal reflux disease without esophagitis: Secondary | ICD-10-CM | POA: Diagnosis not present

## 2022-11-20 DIAGNOSIS — I10 Essential (primary) hypertension: Secondary | ICD-10-CM | POA: Diagnosis not present

## 2022-11-20 DIAGNOSIS — H269 Unspecified cataract: Secondary | ICD-10-CM | POA: Diagnosis not present

## 2022-11-20 DIAGNOSIS — E785 Hyperlipidemia, unspecified: Secondary | ICD-10-CM | POA: Diagnosis not present

## 2022-11-20 DIAGNOSIS — Z471 Aftercare following joint replacement surgery: Secondary | ICD-10-CM | POA: Diagnosis not present

## 2022-11-20 DIAGNOSIS — J309 Allergic rhinitis, unspecified: Secondary | ICD-10-CM | POA: Diagnosis not present

## 2022-11-22 DIAGNOSIS — E785 Hyperlipidemia, unspecified: Secondary | ICD-10-CM | POA: Diagnosis not present

## 2022-11-22 DIAGNOSIS — J309 Allergic rhinitis, unspecified: Secondary | ICD-10-CM | POA: Diagnosis not present

## 2022-11-22 DIAGNOSIS — K219 Gastro-esophageal reflux disease without esophagitis: Secondary | ICD-10-CM | POA: Diagnosis not present

## 2022-11-22 DIAGNOSIS — I10 Essential (primary) hypertension: Secondary | ICD-10-CM | POA: Diagnosis not present

## 2022-11-22 DIAGNOSIS — Z471 Aftercare following joint replacement surgery: Secondary | ICD-10-CM | POA: Diagnosis not present

## 2022-11-22 DIAGNOSIS — H269 Unspecified cataract: Secondary | ICD-10-CM | POA: Diagnosis not present

## 2022-11-28 DIAGNOSIS — E785 Hyperlipidemia, unspecified: Secondary | ICD-10-CM | POA: Diagnosis not present

## 2022-11-28 DIAGNOSIS — J309 Allergic rhinitis, unspecified: Secondary | ICD-10-CM | POA: Diagnosis not present

## 2022-11-28 DIAGNOSIS — K219 Gastro-esophageal reflux disease without esophagitis: Secondary | ICD-10-CM | POA: Diagnosis not present

## 2022-11-28 DIAGNOSIS — Z471 Aftercare following joint replacement surgery: Secondary | ICD-10-CM | POA: Diagnosis not present

## 2022-11-28 DIAGNOSIS — I10 Essential (primary) hypertension: Secondary | ICD-10-CM | POA: Diagnosis not present

## 2022-11-28 DIAGNOSIS — H269 Unspecified cataract: Secondary | ICD-10-CM | POA: Diagnosis not present

## 2022-11-29 DIAGNOSIS — H26491 Other secondary cataract, right eye: Secondary | ICD-10-CM | POA: Diagnosis not present

## 2022-11-30 DIAGNOSIS — H269 Unspecified cataract: Secondary | ICD-10-CM | POA: Diagnosis not present

## 2022-11-30 DIAGNOSIS — K219 Gastro-esophageal reflux disease without esophagitis: Secondary | ICD-10-CM | POA: Diagnosis not present

## 2022-11-30 DIAGNOSIS — Z471 Aftercare following joint replacement surgery: Secondary | ICD-10-CM | POA: Diagnosis not present

## 2022-11-30 DIAGNOSIS — E785 Hyperlipidemia, unspecified: Secondary | ICD-10-CM | POA: Diagnosis not present

## 2022-11-30 DIAGNOSIS — I10 Essential (primary) hypertension: Secondary | ICD-10-CM | POA: Diagnosis not present

## 2022-11-30 DIAGNOSIS — J309 Allergic rhinitis, unspecified: Secondary | ICD-10-CM | POA: Diagnosis not present

## 2022-12-13 DIAGNOSIS — Z9889 Other specified postprocedural states: Secondary | ICD-10-CM | POA: Diagnosis not present

## 2022-12-22 DIAGNOSIS — Z96642 Presence of left artificial hip joint: Secondary | ICD-10-CM | POA: Diagnosis not present

## 2023-01-01 DIAGNOSIS — H353131 Nonexudative age-related macular degeneration, bilateral, early dry stage: Secondary | ICD-10-CM | POA: Diagnosis not present

## 2023-01-01 DIAGNOSIS — H43813 Vitreous degeneration, bilateral: Secondary | ICD-10-CM | POA: Diagnosis not present

## 2023-01-01 DIAGNOSIS — Z961 Presence of intraocular lens: Secondary | ICD-10-CM | POA: Diagnosis not present

## 2023-01-01 DIAGNOSIS — Z9889 Other specified postprocedural states: Secondary | ICD-10-CM | POA: Diagnosis not present

## 2023-03-06 DIAGNOSIS — H6123 Impacted cerumen, bilateral: Secondary | ICD-10-CM | POA: Diagnosis not present

## 2023-03-06 DIAGNOSIS — J301 Allergic rhinitis due to pollen: Secondary | ICD-10-CM | POA: Diagnosis not present

## 2023-03-06 DIAGNOSIS — H903 Sensorineural hearing loss, bilateral: Secondary | ICD-10-CM | POA: Diagnosis not present

## 2023-03-13 DIAGNOSIS — Z96642 Presence of left artificial hip joint: Secondary | ICD-10-CM | POA: Diagnosis not present

## 2023-04-24 ENCOUNTER — Ambulatory Visit (INDEPENDENT_AMBULATORY_CARE_PROVIDER_SITE_OTHER): Payer: Medicare Other

## 2023-04-24 VITALS — Ht 69.5 in | Wt 208.0 lb

## 2023-04-24 DIAGNOSIS — Z0000 Encounter for general adult medical examination without abnormal findings: Secondary | ICD-10-CM | POA: Diagnosis not present

## 2023-04-24 NOTE — Progress Notes (Signed)
Subjective:   Timothy Wang is a 71 y.o. male who presents for Medicare Annual/Subsequent preventive examination.  Visit Complete: Virtual  I connected with  Timothy Wang on 04/24/23 by a audio enabled telemedicine application and verified that I am speaking with the correct person using two identifiers.  Patient Location: Home  Provider Location: Home Office  I discussed the limitations of evaluation and management by telemedicine. The patient expressed understanding and agreed to proceed.  Patient Medicare AWV questionnaire was completed by the patient on 04/20/23; I have confirmed that all information answered by patient is correct and no changes since this date. Vital Signs: Unable to obtain new vitals due to this being a telehealth visit.         Objective:    Today's Vitals   04/24/23 1333  Weight: 208 lb (94.3 kg)  Height: 5' 9.5" (1.765 m)   Body mass index is 30.28 kg/m.     04/24/2023    1:45 PM 11/09/2022   10:44 AM 11/09/2022    6:16 AM 10/27/2022   10:06 AM 05/04/2022    9:04 AM 03/31/2022   10:53 AM 05/11/2020   11:39 AM  Advanced Directives  Does Patient Have a Medical Advance Directive? Yes  Yes Yes No Yes No  Type of Estate agent of [place];Living will Healthcare Power of [employer] Power of [employer] Power of Attorney   Does patient want to make changes to medical advance directive?  No - Patient declined No - Patient declined      Copy of Healthcare Power of Attorney in Chart? No - copy requested No - copy requested No - copy requested No - copy requested  No - copy requested   Would patient like information on creating a medical advance directive?     No - Patient declined  No - Patient declined    Current Medications (verified) Outpatient Encounter Medications as of 04/24/2023  Medication Sig   azelastine (ASTELIN) 0.1 % nasal spray Place 1 spray into both nostrils daily as needed.   enoxaparin (LOVENOX) 40  MG/0.4ML injection Inject 0.4 mLs (40 mg total) into the skin daily for 14 days.   Multiple Vitamins-Minerals (PRESERVISION AREDS 2) CAPS Take 2 capsules by mouth daily.   [DISCONTINUED] acetaminophen (TYLENOL) 500 MG tablet Take 2 tablets (1,000 mg total) by mouth every 8 (eight) hours.   [DISCONTINUED] docusate sodium (COLACE) 100 MG capsule Take 1 capsule (100 mg total) by mouth 2 (two) times daily.   [DISCONTINUED] ondansetron (ZOFRAN) 4 MG tablet Take 1 tablet (4 mg total) by mouth every 6 (six) hours as needed for nausea.   [DISCONTINUED] oxyCODONE (ROXICODONE) 5 MG immediate release tablet Take 0.5-1 tablets (2.5-5 mg total) by mouth every 8 (eight) hours as needed for breakthrough pain.   [DISCONTINUED] traMADol (ULTRAM) 50 MG tablet Take 1 tablet (50 mg total) by mouth every 6 (six) hours as needed for moderate pain.   No facility-administered encounter medications on file as of 04/24/2023.    Allergies (verified) Patient has no known allergies.   History: Past Medical History:  Diagnosis Date   Allergic rhinitis due to pollen    Cataract 2020   bilateral   Depression    GERD (gastroesophageal reflux disease)    H/O: Bell's palsy    Heart murmur    Hyperlipidemia    Hypertension    Ventral hernia    Past Surgical History:  Procedure Laterality Date   CATARACT EXTRACTION  W/PHACO Right 04/20/2020   Procedure: CATARACT EXTRACTION PHACO AND INTRAOCULAR LENS PLACEMENT (IOC) RIGHT 11.14 00:53.9;  Surgeon: Galen Manila, MD;  Location: [facility];  Service: Ophthalmology;  Laterality: Right;   CATARACT EXTRACTION W/PHACO Left 05/11/2020   Procedure: CATARACT EXTRACTION PHACO AND INTRAOCULAR LENS PLACEMENT (IOC) LEFT 4.98 00:28.8 ;  Surgeon: Galen Manila, MD;  Location: [facility];  Service: Ophthalmology;  Laterality: Left;   COLONOSCOPY     COLONOSCOPY WITH PROPOFOL N/A 05/04/2022   Procedure: COLONOSCOPY WITH BIOPSY;  Surgeon: Midge Minium, MD;   Location: [facility];  Service: Endoscopy;  Laterality: N/A;   HEEL SPUR SURGERY Right 1987   KNEE SURGERY Left 2015   tendon repair   POLYPECTOMY N/A 05/04/2022   Procedure: POLYPECTOMY;  Surgeon: Midge Minium, MD;  Location: [facility];  Service: Endoscopy;  Laterality: N/A;   TONSILLECTOMY  1960   TOTAL HIP ARTHROPLASTY Left 11/09/2022   Procedure: TOTAL HIP ARTHROPLASTY ANTERIOR APPROACH;  Surgeon: Reinaldo Berber, MD;  Location: [facility];  Service: Orthopedics;  Laterality: Left;   VENTRAL HERNIA REPAIR N/A 11/03/2016   Procedure: HERNIA REPAIR VENTRAL ADULT WITH MESH;  Surgeon: Ricarda Frame, MD;  Location: [facility];  Service: General;  Laterality: N/A;   Family History  Problem Relation Age of Onset   Pancreatic cancer Mother    Alcohol abuse Father    Heart attack Father    Thyroid disease Sister    Depression Sister    Cancer Sister        breast   Social History   Socioeconomic History   Marital status: Single    Spouse name: Not on file   Number of children: 0   Years of education: Not on file   Highest education level: Not on file  Occupational History   Occupation: Investment banker, corporate: [employer]  Tobacco Use   Smoking status: Former    Current packs/day: 0.00    Average packs/day: 1 pack/day for 10.0 years (10.0 ttl pk-yrs)    Types: Cigarettes    Start date: 10/26/1992    Quit date: 10/27/2002    Years since quitting: 20.5   Smokeless tobacco: Never   Tobacco comments:    quit 2004  Vaping Use   Vaping status: Never Used  Substance and Sexual Activity   Alcohol use: Yes    Comment: rarely beer and wine   Drug use: No   Sexual activity: Not on file  Other Topics Concern   Not on file  Social History Narrative   Not on file   Social Determinants of Health   Financial Resource Strain: Low Risk  (04/20/2023)   Overall Financial Resource Strain (CARDIA)    Difficulty of Paying Living Expenses: Not hard at all  Food Insecurity: No  Food Insecurity (04/20/2023)   Hunger Vital Sign    Worried About Running Out of Food in the Last Year: Never true    Ran Out of Food in the Last Year: Never true  Transportation Needs: No Transportation Needs (04/20/2023)   PRAPARE - Administrator, Civil Service (Medical): No    Lack of Transportation (Non-Medical): No  Physical Activity: Insufficiently Active (04/20/2023)   Exercise Vital Sign    Days of Exercise per Week: 2 days    Minutes of Exercise per Session: 20 min  Stress: No Stress Concern Present (04/20/2023)   [employer] of Occupational Health - Occupational Stress Questionnaire    Feeling of  Stress : Only a little  Social Connections: Unknown (04/20/2023)   Social Connection and Isolation Panel [NHANES]    Frequency of Communication with Friends and Family: Twice a week    Frequency of Social Gatherings with Friends and Family: Once a week    Attends Religious Services: Not on Marketing executive or Organizations: No    Attends Banker Meetings: Never    Marital Status: Never married    Tobacco Counseling Counseling given: Not Answered Tobacco comments: quit 2004   Clinical Intake:  Pre-visit preparation completed: Yes  Pain : No/denies pain     BMI - recorded: 30.28 Nutritional Status: BMI > 30  Obese Nutritional Risks: None Diabetes: No  How often do you need to have someone help you when you read instructions, pamphlets, or other written materials from your doctor or pharmacy?: 1 - Never  Interpreter Needed?: No  Information entered by :: Theresa Mulligan LPN   Activities of Daily Living    04/20/2023    3:13 PM 11/09/2022   10:44 AM  In your present state of health, do you have any difficulty performing the following activities:  Hearing? 1 1  Comment Wears hearing aids   Vision? 0 0  Difficulty concentrating or making decisions? 0 0  Walking or climbing stairs? 0 1  Dressing or bathing? 0 0  Doing  errands, shopping? 0 0  Preparing Food and eating ? N   Using the Toilet? N   In the past six months, have you accidently leaked urine? N   Do you have problems with loss of bowel control? N   Managing your Medications? N   Managing your Finances? N   Housekeeping or managing your Housekeeping? N     Patient Care Team: Hannah Beat, MD as PCP - General  Indicate any recent Medical Services you may have received from other than [facility] providers in the past year (date may be approximate).     Assessment:   This is a routine wellness examination for [place].  Hearing/Vision screen Hearing Screening - Comments:: Wears hearing aids Vision Screening - Comments:: Wears rx glasses - up to date with routine eye exams with  [facility]   Goals Addressed               This Visit's Progress     Keep weight down (pt-stated)        Remain active,       Depression Screen    04/24/2023    1:40 PM 03/31/2022   10:56 AM 03/09/2021    2:50 PM 10/13/2019   11:20 AM 10/09/2018    9:44 AM 09/05/2017    9:03 AM  PHQ 2/9 Scores  PHQ - 2 Score 0 0 0 0 0 0  PHQ- 9 Score    0 0     Fall Risk    04/20/2023    3:13 PM 03/31/2022   10:55 AM 03/27/2022    8:43 AM 03/09/2021    2:50 PM 10/13/2019   10:32 AM  Fall Risk   Falls in the past year? 0 0 0 0 0  Number falls in past yr: 0 0 0    Injury with Fall? 0 0 0    Risk for fall due to :  Medication side effect     Follow up  Falls prevention discussed;Education provided;Falls evaluation completed       MEDICARE RISK AT HOME: Medicare Risk  at Home Any stairs in or around the home?: No If so, are there any without handrails?: No Home free of loose throw rugs in walkways, pet beds, electrical cords, etc?: Yes Adequate lighting in your home to reduce risk of falls?: Yes Life alert?: No Use of a cane, walker or w/c?: No Grab bars in the bathroom?: Yes Shower chair or bench in shower?: Yes Elevated toilet seat or a handicapped toilet?:  No  TIMED UP AND GO:  Was the test performed?  No    Cognitive Function:    10/09/2018    9:45 AM  MMSE - Mini Mental State Exam  Orientation to time 5  Orientation to Place 5  Registration 3  Attention/ Calculation 0  Recall 3  Language- name 2 objects 0  Language- repeat 1  Language- follow 3 step command 3  Language- read & follow direction 0  Write a sentence 0  Copy design 0  Total score 20        04/24/2023    1:45 PM 03/31/2022   11:00 AM  6CIT Screen  What Year? 0 points 0 points  What month? 0 points 0 points  What time? 0 points 0 points  Count back from 20 0 points 0 points  Months in reverse 0 points 0 points  Repeat phrase 0 points 0 points  Total Score 0 points 0 points    Immunizations Immunization History  Administered Date(s) Administered   Influenza Whole 04/10/2011   Influenza,inj,Quad PF,6+ Mos 04/30/2014, 06/05/2016, 05/24/2017, 10/09/2018   Influenza-Unspecified 04/18/2019, 04/21/2020, 05/09/2021   PFIZER(Purple Top)SARS-COV-2 Vaccination 09/20/2019, 10/14/2019, 07/06/2020   Pneumococcal Conjugate-13 05/24/2017   Pneumococcal Polysaccharide-23 10/09/2018   Tdap 02/17/2013   Zoster Recombinant(Shingrix) 04/18/2018, 08/08/2018   Zoster, Live 09/10/2014    TDAP status: Due, Education has been provided regarding the importance of this vaccine. Advised may receive this vaccine at local pharmacy or Health Dept. Aware to provide a copy of the vaccination record if obtained from local pharmacy or Health Dept. Verbalized acceptance and understanding.  Flu Vaccine status: Due, Education has been provided regarding the importance of this vaccine. Advised may receive this vaccine at local pharmacy or Health Dept. Aware to provide a copy of the vaccination record if obtained from local pharmacy or Health Dept. Verbalized acceptance and understanding.  Pneumococcal vaccine status: Up to date  Covid-19 vaccine status: Declined, Education has been provided  regarding the importance of this vaccine but patient still declined. Advised may receive this vaccine at local pharmacy or Health Dept.or vaccine clinic. Aware to provide a copy of the vaccination record if obtained from local pharmacy or Health Dept. Verbalized acceptance and understanding.  Qualifies for Shingles Vaccine? Yes   Zostavax completed Yes   Shingrix Completed?: Yes  Screening Tests Health Maintenance  Topic Date Due   DTaP/Tdap/Td (2 - Td or Tdap) 02/18/2023   INFLUENZA VACCINE  03/01/2023   COVID-19 Vaccine (4 - 2023-24 season) 04/01/2023   Medicare Annual Wellness (AWV)  04/23/2024   Colonoscopy  05/05/2027   Pneumonia Vaccine 70+ Years old  Completed   Hepatitis C Screening  Completed   Zoster Vaccines- Shingrix  Completed   HPV VACCINES  Aged Out    Health Maintenance  Health Maintenance Due  Topic Date Due   DTaP/Tdap/Td (2 - Td or Tdap) 02/18/2023   INFLUENZA VACCINE  03/01/2023   COVID-19 Vaccine (4 - 2023-24 season) 04/01/2023    Colorectal cancer screening: Type of screening: Colonoscopy. Completed 05/04/22. Repeat  every 5 years    Additional Screening:  Hepatitis C Screening: does qualify; Completed 05/29/16  Vision Screening: Recommended annual ophthalmology exams for early detection of glaucoma and other disorders of the eye. Is the patient up to date with their annual eye exam?  Yes  Who is the provider or what is the name of the office in which the patient attends annual eye exams? [facility] If pt is not established with a provider, would they like to be referred to a provider to establish care? No .   Dental Screening: Recommended annual dental exams for proper oral hygiene   Community Resource Referral / Chronic Care Management:  CRR required this visit?  No   CCM required this visit?  No     Plan:     I have personally reviewed and noted the following in the patient's chart:   Medical and social history Use of alcohol,  tobacco or illicit drugs  Current medications and supplements including opioid prescriptions. Patient is not currently taking opioid prescriptions. Functional ability and status Nutritional status Physical activity Advanced directives List of other physicians Hospitalizations, surgeries, and ER visits in previous 12 months Vitals Screenings to include cognitive, depression, and falls Referrals and appointments  In addition, I have reviewed and discussed with patient certain preventive protocols, quality metrics, and best practice recommendations. A written personalized care plan for preventive services as well as general preventive health recommendations were provided to patient.     Tillie Rung, LPN   [id number]   After Visit Summary: (MyChart) Due to this being a telephonic visit, the after visit summary with patients personalized plan was offered to patient via MyChart   Nurse Notes: None

## 2023-04-24 NOTE — Patient Instructions (Addendum)
Timothy Wang , Thank you for taking time to come for your Medicare Wellness Visit. I appreciate your ongoing commitment to your health goals. Please review the following plan we discussed and let me know if I can assist you in the future.   Referrals/Orders/Follow-Ups/Clinician Recommendations:   This is a list of the screening recommended for you and due dates:  Health Maintenance  Topic Date Due   DTaP/Tdap/Td vaccine (2 - Td or Tdap) 02/18/2023   Flu Shot  03/01/2023   COVID-19 Vaccine (4 - 2023-24 season) 04/01/2023   Medicare Annual Wellness Visit  04/23/2024   Colon Cancer Screening  05/05/2027   Pneumonia Vaccine  Completed   Hepatitis C Screening  Completed   Zoster (Shingles) Vaccine  Completed   HPV Vaccine  Aged Out    Advanced directives: (Copy Requested) Please bring a copy of your health care power of attorney and living will to the office to be added to your chart at your convenience.  Next Medicare Annual Wellness Visit scheduled for next year: Yes

## 2023-05-19 DIAGNOSIS — Z23 Encounter for immunization: Secondary | ICD-10-CM | POA: Diagnosis not present

## 2023-06-13 DIAGNOSIS — Z96642 Presence of left artificial hip joint: Secondary | ICD-10-CM | POA: Diagnosis not present

## 2023-06-19 DIAGNOSIS — Z961 Presence of intraocular lens: Secondary | ICD-10-CM | POA: Diagnosis not present

## 2023-06-19 DIAGNOSIS — H353131 Nonexudative age-related macular degeneration, bilateral, early dry stage: Secondary | ICD-10-CM | POA: Diagnosis not present

## 2023-06-19 DIAGNOSIS — H26492 Other secondary cataract, left eye: Secondary | ICD-10-CM | POA: Diagnosis not present

## 2023-06-26 DIAGNOSIS — H26492 Other secondary cataract, left eye: Secondary | ICD-10-CM | POA: Diagnosis not present

## 2023-07-10 DIAGNOSIS — H353131 Nonexudative age-related macular degeneration, bilateral, early dry stage: Secondary | ICD-10-CM | POA: Diagnosis not present

## 2023-07-10 DIAGNOSIS — H26492 Other secondary cataract, left eye: Secondary | ICD-10-CM | POA: Diagnosis not present

## 2023-07-10 DIAGNOSIS — Z961 Presence of intraocular lens: Secondary | ICD-10-CM | POA: Diagnosis not present

## 2023-07-31 ENCOUNTER — Telehealth: Payer: Self-pay | Admitting: *Deleted

## 2023-07-31 DIAGNOSIS — Z79899 Other long term (current) drug therapy: Secondary | ICD-10-CM

## 2023-07-31 DIAGNOSIS — E7800 Pure hypercholesterolemia, unspecified: Secondary | ICD-10-CM

## 2023-07-31 DIAGNOSIS — Z125 Encounter for screening for malignant neoplasm of prostate: Secondary | ICD-10-CM

## 2023-07-31 DIAGNOSIS — Z131 Encounter for screening for diabetes mellitus: Secondary | ICD-10-CM

## 2023-07-31 NOTE — Telephone Encounter (Signed)
-----   Message from Alvina Chou sent at 07/30/2023  3:30 PM EST ----- Regarding: Lab orders for Mon, 1.13.25 Patient is scheduled for CPX labs, please order future labs, Thanks , Camelia Eng

## 2023-08-13 ENCOUNTER — Other Ambulatory Visit: Payer: Medicare Other

## 2023-08-13 DIAGNOSIS — Z125 Encounter for screening for malignant neoplasm of prostate: Secondary | ICD-10-CM

## 2023-08-13 DIAGNOSIS — Z131 Encounter for screening for diabetes mellitus: Secondary | ICD-10-CM | POA: Diagnosis not present

## 2023-08-13 DIAGNOSIS — E7800 Pure hypercholesterolemia, unspecified: Secondary | ICD-10-CM | POA: Diagnosis not present

## 2023-08-13 DIAGNOSIS — Z79899 Other long term (current) drug therapy: Secondary | ICD-10-CM

## 2023-08-13 LAB — CBC WITH DIFFERENTIAL/PLATELET
Basophils Absolute: 0 10*3/uL (ref 0.0–0.1)
Basophils Relative: 0.7 % (ref 0.0–3.0)
Eosinophils Absolute: 0.1 10*3/uL (ref 0.0–0.7)
Eosinophils Relative: 1.2 % (ref 0.0–5.0)
HCT: 48.3 % (ref 39.0–52.0)
Hemoglobin: 16.1 g/dL (ref 13.0–17.0)
Lymphocytes Relative: 19.8 % (ref 12.0–46.0)
Lymphs Abs: 0.9 10*3/uL (ref 0.7–4.0)
MCHC: 33.3 g/dL (ref 30.0–36.0)
MCV: 91.1 fL (ref 78.0–100.0)
Monocytes Absolute: 0.5 10*3/uL (ref 0.1–1.0)
Monocytes Relative: 10.5 % (ref 3.0–12.0)
Neutro Abs: 3 10*3/uL (ref 1.4–7.7)
Neutrophils Relative %: 67.8 % (ref 43.0–77.0)
Platelets: 310 10*3/uL (ref 150.0–400.0)
RBC: 5.3 Mil/uL (ref 4.22–5.81)
RDW: 14.2 % (ref 11.5–15.5)
WBC: 4.4 10*3/uL (ref 4.0–10.5)

## 2023-08-13 LAB — COMPREHENSIVE METABOLIC PANEL
ALT: 13 U/L (ref 0–53)
AST: 13 U/L (ref 0–37)
Albumin: 4.1 g/dL (ref 3.5–5.2)
Alkaline Phosphatase: 100 U/L (ref 39–117)
BUN: 13 mg/dL (ref 6–23)
CO2: 31 meq/L (ref 19–32)
Calcium: 9.4 mg/dL (ref 8.4–10.5)
Chloride: 103 meq/L (ref 96–112)
Creatinine, Ser: 0.67 mg/dL (ref 0.40–1.50)
GFR: 93.72 mL/min (ref >60.00)
Glucose, Bld: 83 mg/dL (ref 70–99)
Potassium: 5.2 meq/L — ABNORMAL HIGH (ref 3.5–5.1)
Sodium: 141 meq/L (ref 135–145)
Total Bilirubin: 0.6 mg/dL (ref 0.2–1.2)
Total Protein: 6.7 g/dL (ref 6.0–8.3)

## 2023-08-13 LAB — HEPATIC FUNCTION PANEL
ALT: 13 U/L (ref 0–53)
AST: 13 U/L (ref 0–37)
Albumin: 4.1 g/dL (ref 3.5–5.2)
Alkaline Phosphatase: 100 U/L (ref 39–117)
Bilirubin, Direct: 0.2 mg/dL (ref 0.0–0.3)
Total Bilirubin: 0.6 mg/dL (ref 0.2–1.2)
Total Protein: 6.7 g/dL (ref 6.0–8.3)

## 2023-08-13 LAB — BASIC METABOLIC PANEL
BUN: 13 mg/dL (ref 6–23)
CO2: 31 meq/L (ref 19–32)
Calcium: 9.4 mg/dL (ref 8.4–10.5)
Chloride: 103 meq/L (ref 96–112)
Creatinine, Ser: 0.67 mg/dL (ref 0.40–1.50)
GFR: 93.72 mL/min (ref >60.00)
Glucose, Bld: 83 mg/dL (ref 70–99)
Potassium: 5.2 meq/L — ABNORMAL HIGH (ref 3.5–5.1)
Sodium: 141 meq/L (ref 135–145)

## 2023-08-13 LAB — HEMOGLOBIN A1C: Hgb A1c MFr Bld: 6.1 % (ref 4.6–6.5)

## 2023-08-13 LAB — PSA, MEDICARE: PSA: 0.84 ng/mL (ref 0.10–4.00)

## 2023-08-19 DIAGNOSIS — R7303 Prediabetes: Secondary | ICD-10-CM | POA: Insufficient documentation

## 2023-08-19 NOTE — Progress Notes (Unsigned)
   Timothy Massar T. Timothy Gibbons, MD, CAQ Sports Medicine [facility] at [facility] [address] [place], [address]  Phone: [phone]  FAX: [phone]  Timothy Wang - 73 y.o. male  MRN [id number]  Date of Birth: 10-14-1950  Date: 08/20/2023  PCP: Hannah Beat, MD  Referral: Hannah Beat, MD  No chief complaint on file.  Subjective:   Timothy Wang is a 73 y.o. very pleasant male patient with There is no height or weight on file to calculate BMI. who presents with the following:  Patient is here for follow-up after his Medicare wellness exam that was done in September 2024, here for routine health management and follow-up of chronic conditions.  He is basically a fairly healthy gentleman, and he currently is not taking any medication.  He does have a history of hypertension, hyperlipidemia, and he did have an issue with some depression and number years ago.  I inadvertently did not check his cholesterol.  The last time it was 192 total and 141 LDL within normal HDL.    Review of Systems is noted in the HPI, as appropriate  Objective:   There were no vitals taken for this visit.  GEN: No acute distress; alert,appropriate. PULM: Breathing comfortably in no respiratory distress PSYCH: Normally interactive.   Laboratory and Imaging Data:  Assessment and Plan:   ***

## 2023-08-20 ENCOUNTER — Ambulatory Visit (INDEPENDENT_AMBULATORY_CARE_PROVIDER_SITE_OTHER): Payer: Medicare Other | Admitting: Family Medicine

## 2023-08-20 ENCOUNTER — Encounter: Payer: Self-pay | Admitting: Family Medicine

## 2023-08-20 VITALS — BP 160/86 | HR 90 | Temp 98.0°F | Ht 69.0 in | Wt 200.5 lb

## 2023-08-20 DIAGNOSIS — E782 Mixed hyperlipidemia: Secondary | ICD-10-CM | POA: Diagnosis not present

## 2023-08-20 DIAGNOSIS — R7303 Prediabetes: Secondary | ICD-10-CM

## 2023-08-20 DIAGNOSIS — G8929 Other chronic pain: Secondary | ICD-10-CM

## 2023-08-20 DIAGNOSIS — I10 Essential (primary) hypertension: Secondary | ICD-10-CM

## 2023-08-20 DIAGNOSIS — M25512 Pain in left shoulder: Secondary | ICD-10-CM

## 2023-08-20 DIAGNOSIS — M25511 Pain in right shoulder: Secondary | ICD-10-CM

## 2023-08-20 MED ORDER — SIMVASTATIN 40 MG PO TABS: 40.0000 mg | ORAL_TABLET | Freq: Every day | ORAL | 3 refills | Status: AC

## 2023-08-20 NOTE — Patient Instructions (Signed)
Check your blood pressure everyday for the next 2 weeks.  I want to know if it is above 140/90, and you might need blood pressure medicine if it is that high.

## 2024-07-01 ENCOUNTER — Ambulatory Visit

## 2024-08-15 ENCOUNTER — Ambulatory Visit

## 2024-09-18 ENCOUNTER — Ambulatory Visit
